# Patient Record
Sex: Female | Born: 1960 | Race: White | Hispanic: No | Marital: Married | Smoking: Never smoker
Health system: Southern US, Community
[De-identification: ages and names within clinical notes are randomized; demographics above are authoritative.]

## PROBLEM LIST (undated history)

## (undated) DIAGNOSIS — T7840XA Allergy, unspecified, initial encounter: Secondary | ICD-10-CM

## (undated) DIAGNOSIS — H409 Unspecified glaucoma: Secondary | ICD-10-CM

## (undated) DIAGNOSIS — E785 Hyperlipidemia, unspecified: Secondary | ICD-10-CM

## (undated) DIAGNOSIS — Z803 Family history of malignant neoplasm of breast: Secondary | ICD-10-CM

## (undated) DIAGNOSIS — R918 Other nonspecific abnormal finding of lung field: Secondary | ICD-10-CM

## (undated) DIAGNOSIS — D219 Benign neoplasm of connective and other soft tissue, unspecified: Secondary | ICD-10-CM

## (undated) DIAGNOSIS — Z8042 Family history of malignant neoplasm of prostate: Secondary | ICD-10-CM

## (undated) DIAGNOSIS — Z923 Personal history of irradiation: Secondary | ICD-10-CM

## (undated) DIAGNOSIS — I739 Peripheral vascular disease, unspecified: Secondary | ICD-10-CM

## (undated) DIAGNOSIS — M79604 Pain in right leg: Secondary | ICD-10-CM

## (undated) DIAGNOSIS — M79605 Pain in left leg: Secondary | ICD-10-CM

## (undated) DIAGNOSIS — Z973 Presence of spectacles and contact lenses: Secondary | ICD-10-CM

## (undated) DIAGNOSIS — N8110 Cystocele, unspecified: Secondary | ICD-10-CM

## (undated) DIAGNOSIS — F419 Anxiety disorder, unspecified: Secondary | ICD-10-CM

## (undated) DIAGNOSIS — I10 Essential (primary) hypertension: Secondary | ICD-10-CM

## (undated) DIAGNOSIS — I8393 Asymptomatic varicose veins of bilateral lower extremities: Secondary | ICD-10-CM

## (undated) DIAGNOSIS — Z9889 Other specified postprocedural states: Secondary | ICD-10-CM

## (undated) DIAGNOSIS — R112 Nausea with vomiting, unspecified: Secondary | ICD-10-CM

## (undated) DIAGNOSIS — C801 Malignant (primary) neoplasm, unspecified: Secondary | ICD-10-CM

## (undated) HISTORY — PX: TONSILLECTOMY: SUR1361

## (undated) HISTORY — DX: Family history of malignant neoplasm of breast: Z80.3

## (undated) HISTORY — DX: Pain in right leg: M79.604

## (undated) HISTORY — DX: Pain in right leg: M79.605

## (undated) HISTORY — DX: Benign neoplasm of connective and other soft tissue, unspecified: D21.9

## (undated) HISTORY — PX: VAGINAL PROLAPSE REPAIR: SHX830

## (undated) HISTORY — DX: Unspecified glaucoma: H40.9

## (undated) HISTORY — PX: OTHER SURGICAL HISTORY: SHX169

## (undated) HISTORY — DX: Asymptomatic varicose veins of bilateral lower extremities: I83.93

## (undated) HISTORY — PX: RHINOPLASTY: SUR1284

## (undated) HISTORY — DX: Family history of malignant neoplasm of prostate: Z80.42

## (undated) HISTORY — DX: Hyperlipidemia, unspecified: E78.5

## (undated) HISTORY — DX: Allergy, unspecified, initial encounter: T78.40XA

## (undated) HISTORY — DX: Anxiety disorder, unspecified: F41.9

---

## 1993-12-16 HISTORY — PX: VEIN LIGATION AND STRIPPING: SHX2653

## 1999-09-21 ENCOUNTER — Other Ambulatory Visit: Admission: RE | Admit: 1999-09-21 | Discharge: 1999-09-21 | Payer: Self-pay | Admitting: Obstetrics and Gynecology

## 2000-09-02 ENCOUNTER — Other Ambulatory Visit: Admission: RE | Admit: 2000-09-02 | Discharge: 2000-09-02 | Payer: Self-pay | Admitting: Obstetrics and Gynecology

## 2000-11-03 ENCOUNTER — Other Ambulatory Visit: Admission: RE | Admit: 2000-11-03 | Discharge: 2000-11-03 | Payer: Self-pay | Admitting: Obstetrics and Gynecology

## 2001-02-27 ENCOUNTER — Other Ambulatory Visit: Admission: RE | Admit: 2001-02-27 | Discharge: 2001-02-27 | Payer: Self-pay | Admitting: Obstetrics and Gynecology

## 2001-09-16 ENCOUNTER — Emergency Department (HOSPITAL_COMMUNITY): Admission: EM | Admit: 2001-09-16 | Discharge: 2001-09-16 | Payer: Self-pay | Admitting: Emergency Medicine

## 2001-11-18 ENCOUNTER — Encounter: Payer: Self-pay | Admitting: Unknown Physician Specialty

## 2001-11-18 ENCOUNTER — Ambulatory Visit (HOSPITAL_COMMUNITY): Admission: RE | Admit: 2001-11-18 | Discharge: 2001-11-18 | Payer: Self-pay | Admitting: Unknown Physician Specialty

## 2002-11-23 ENCOUNTER — Encounter: Payer: Self-pay | Admitting: Unknown Physician Specialty

## 2002-11-23 ENCOUNTER — Ambulatory Visit (HOSPITAL_COMMUNITY): Admission: RE | Admit: 2002-11-23 | Discharge: 2002-11-23 | Payer: Self-pay | Admitting: Unknown Physician Specialty

## 2003-10-17 ENCOUNTER — Encounter: Admission: RE | Admit: 2003-10-17 | Discharge: 2003-10-17 | Payer: Self-pay | Admitting: Family Medicine

## 2003-11-17 ENCOUNTER — Encounter: Admission: RE | Admit: 2003-11-17 | Discharge: 2003-11-17 | Payer: Self-pay | Admitting: Unknown Physician Specialty

## 2004-09-26 ENCOUNTER — Encounter: Admission: RE | Admit: 2004-09-26 | Discharge: 2004-09-26 | Payer: Self-pay | Admitting: Unknown Physician Specialty

## 2004-12-07 ENCOUNTER — Encounter: Admission: RE | Admit: 2004-12-07 | Discharge: 2004-12-07 | Payer: Self-pay | Admitting: Unknown Physician Specialty

## 2006-03-05 ENCOUNTER — Other Ambulatory Visit: Admission: RE | Admit: 2006-03-05 | Discharge: 2006-03-05 | Payer: Self-pay | Admitting: Obstetrics and Gynecology

## 2006-03-17 ENCOUNTER — Encounter: Admission: RE | Admit: 2006-03-17 | Discharge: 2006-03-17 | Payer: Self-pay | Admitting: Obstetrics and Gynecology

## 2006-05-27 ENCOUNTER — Other Ambulatory Visit: Admission: RE | Admit: 2006-05-27 | Discharge: 2006-05-27 | Payer: Self-pay | Admitting: Obstetrics and Gynecology

## 2007-03-19 ENCOUNTER — Encounter: Admission: RE | Admit: 2007-03-19 | Discharge: 2007-03-19 | Payer: Self-pay | Admitting: Obstetrics and Gynecology

## 2007-06-01 ENCOUNTER — Other Ambulatory Visit: Admission: RE | Admit: 2007-06-01 | Discharge: 2007-06-01 | Payer: Self-pay | Admitting: Obstetrics and Gynecology

## 2007-06-17 ENCOUNTER — Encounter: Admission: RE | Admit: 2007-06-17 | Discharge: 2007-06-17 | Payer: Self-pay | Admitting: Obstetrics and Gynecology

## 2008-04-21 ENCOUNTER — Encounter: Admission: RE | Admit: 2008-04-21 | Discharge: 2008-04-21 | Payer: Self-pay | Admitting: Obstetrics and Gynecology

## 2008-06-07 ENCOUNTER — Other Ambulatory Visit: Admission: RE | Admit: 2008-06-07 | Discharge: 2008-06-07 | Payer: Self-pay | Admitting: Obstetrics and Gynecology

## 2008-06-13 ENCOUNTER — Encounter: Admission: RE | Admit: 2008-06-13 | Discharge: 2008-06-13 | Payer: Self-pay | Admitting: Obstetrics and Gynecology

## 2008-12-16 HISTORY — PX: VAGINAL PROLAPSE REPAIR: SHX830

## 2008-12-16 HISTORY — PX: BLADDER SUSPENSION: SHX72

## 2009-05-30 ENCOUNTER — Inpatient Hospital Stay (HOSPITAL_COMMUNITY): Admission: RE | Admit: 2009-05-30 | Discharge: 2009-05-31 | Payer: Self-pay | Admitting: Obstetrics and Gynecology

## 2009-07-19 ENCOUNTER — Encounter: Admission: RE | Admit: 2009-07-19 | Discharge: 2009-07-19 | Payer: Self-pay | Admitting: Obstetrics and Gynecology

## 2010-11-22 ENCOUNTER — Encounter
Admission: RE | Admit: 2010-11-22 | Discharge: 2010-11-22 | Payer: Self-pay | Source: Home / Self Care | Admitting: Obstetrics and Gynecology

## 2011-01-05 ENCOUNTER — Encounter: Payer: Self-pay | Admitting: Unknown Physician Specialty

## 2011-03-25 LAB — CBC
HCT: 33.3 % — ABNORMAL LOW (ref 36.0–46.0)
HCT: 42.2 % (ref 36.0–46.0)
Hemoglobin: 11.8 g/dL — ABNORMAL LOW (ref 12.0–15.0)
Hemoglobin: 14.8 g/dL (ref 12.0–15.0)
MCHC: 35.5 g/dL (ref 30.0–36.0)
MCHC: 35.2 g/dL (ref 30.0–36.0)
MCV: 91.8 fL (ref 78.0–100.0)
MCV: 91.7 fL (ref 78.0–100.0)
Platelets: 205 10*3/uL (ref 150–400)
Platelets: 266 10*3/uL (ref 150–400)
RBC: 3.63 MIL/uL — ABNORMAL LOW (ref 3.87–5.11)
RBC: 4.6 MIL/uL (ref 3.87–5.11)
RDW: 12.5 % (ref 11.5–15.5)
RDW: 12.4 % (ref 11.5–15.5)
WBC: 12.3 10*3/uL — ABNORMAL HIGH (ref 4.0–10.5)
WBC: 8.3 10*3/uL (ref 4.0–10.5)

## 2011-03-25 LAB — BASIC METABOLIC PANEL
BUN: 8 mg/dL (ref 6–23)
BUN: 14 mg/dL (ref 6–23)
CO2: 26 mEq/L (ref 19–32)
CO2: 26 mEq/L (ref 19–32)
Calcium: 8.2 mg/dL — ABNORMAL LOW (ref 8.4–10.5)
Calcium: 9 mg/dL (ref 8.4–10.5)
Chloride: 108 mEq/L (ref 96–112)
Chloride: 106 mEq/L (ref 96–112)
Creatinine, Ser: 0.67 mg/dL (ref 0.4–1.2)
Creatinine, Ser: 0.7 mg/dL (ref 0.4–1.2)
GFR calc Af Amer: >60 mL/min (ref >60)
GFR calc Af Amer: >60 mL/min (ref >60)
GFR calc non Af Amer: >60 mL/min (ref >60)
GFR calc non Af Amer: >60 mL/min (ref >60)
Glucose, Bld: 96 mg/dL (ref 70–99)
Glucose, Bld: 90 mg/dL (ref 70–99)
Potassium: 3.8 mEq/L (ref 3.5–5.1)
Potassium: 3.9 mEq/L (ref 3.5–5.1)
Sodium: 137 mEq/L (ref 135–145)
Sodium: 137 mEq/L (ref 135–145)

## 2011-03-25 LAB — PREGNANCY, URINE: Preg Test, Ur: NEGATIVE

## 2011-03-25 LAB — URINALYSIS, ROUTINE W REFLEX MICROSCOPIC
Bilirubin Urine: NEGATIVE
Glucose, UA: NEGATIVE mg/dL
Hgb urine dipstick: NEGATIVE
Ketones, ur: NEGATIVE mg/dL
Nitrite: NEGATIVE
Protein, ur: NEGATIVE mg/dL
Specific Gravity, Urine: 1.015 (ref 1.005–1.030)
Urobilinogen, UA: 0.2 mg/dL (ref 0.0–1.0)
pH: 7.5 (ref 5.0–8.0)

## 2011-04-30 NOTE — Op Note (Signed)
Rhonda [name], Rhonda [name]                  ACCOUNT NO.:  [id number]   MEDICAL RECORD NO.:  [id number]          PATIENT TYPE:  INP   LOCATION:  9316                          FACILITY:  [facility]   PHYSICIAN:  Randye Lobo, M.D.   DATE OF BIRTH:  1961/10/28   DATE OF PROCEDURE:  05/30/2009  DATE OF DISCHARGE:                               OPERATIVE REPORT   PREOPERATIVE DIAGNOSES:  1. Genuine stress incontinence.  2. Incomplete vaginal prolapse.   POSTOPERATIVE DIAGNOSES:  1. Genuine stress incontinence.  2. Incomplete vaginal prolapse.   PROCEDURE:  Tension-free vaginal tape, mid urethral sling, and  cystoscopy, anterior and posterior colporrhaphy.   SURGEON:  Randye Lobo, MD   ASSISTANT:  Luvenia Redden, MD   ANESTHESIA:  General endotracheal.   IV FLUIDS:  1000 mL of Ringer's lactate.   ESTIMATED BLOOD LOSS:  250 mL.   URINE OUTPUT:  Quantity sufficient.   COMPLICATIONS:  None.   INDICATIONS FOR THE PROCEDURE:  The patient is a 50 year old, para 2,  Caucasian female, who presented with leakage of urine with sneezing,  coughing, and exercise.  The patient desired surgical treatment.  She  underwent multichannel urodynamic testing in the office on January 09, 2009, which documented a Valsalva leak point pressure of 132 cm of  water.  The patient had a stable cystometrogram.  Her uroflowmetry study  showed a normal voiding pattern with a postvoid residual of 7 mL.  The  patient on physical examination was noted to have a second-degree  cystocele and a second-degree rectocele, and she had good evidence of  uterine support.  The patient now desires treatment for the stress  incontinence and further cystocele and rectocele, and she declines a  hysterectomy.  A plan is therefore made to proceed with the mid urethral  sling, cystoscopy, and anterior and posterior colporrhaphy after risks,  benefits, and alternatives are reviewed.   FINDINGS:  Exam under anesthesia revealed a  second-degree cystocele and  a second-degree rectocele.  The uterus was quite high and well  supported.  There were no masses appreciated.   Cystoscopy demonstrated patent ureters bilaterally.  The bladder was  visualized throughout 360 degrees including the bladder dome and  trigone.  There was no evidence of a foreign body in either the bladder  or the urethra.   SPECIMENS:  None.   PROCEDURE:  The patient was reidentified in the preoperative hold area.  She did receive cefotetan 1 g IV for antibiotic prophylaxis and she  received TED hose and PAS stockings for DVT prophylaxis.   In the operating room, general endotracheal anesthesia was induced and  the patient was then placed in the dorsal lithotomy position.  The lower  abdomen, vagina, and perineum were sterilely prepped and draped.  A  Foley catheter was placed inside the bladder.   An exam under anesthesia was performed.   A weighted speculum was placed inside the vagina.  Allis clamps were  used to mark the midline of the vaginal mucosa from the level of 1 cm  below the urethral  meatus to approximately 2 cm above the cervix.  The  mucosa was injected locally with 0.5% lidocaine with 1:200,000 of  epinephrine.  The vaginal mucosa was incised vertically with a scalpel.  A Metzenbaum scissors was used to dissect the subvaginal tissue off the  overlying vaginal mucosa bilaterally.  Hemostasis was created with  monopolar cautery and also with figure-of-eight sutures of 2-0 Vicryl of  veins overlying the bladder.  The dissection was carried back to the  level of the pubic rami bilaterally.   The sling was performed next in the top-down fashion.  A 1 cm incisions  were created with 3 cm to the right and left of the midline and the  suprapubic region.  The abdominal needle passer was placed through the  right suprapubic incision and out through the endopelvic fascia at the  level of the mid urethra and on the ipsilateral side.   The same  procedure that was performed on the right-hand side was then repeated on  the left-hand side with the other abdominal needle passer.   The Foley was removed and cystoscopy was performed.  The abdominal  needle passer on the left-hand side showed no bladder perforation, but  was thought to be too close to the bladder wall.  It was therefore  removed and the bladder was drained of cystoscopy fluid and the Foley  catheter was replaced.  The abdominal needle passer on the left-hand  side was repositioned appropriately.  The Foley catheter was removed and  repeat cystoscopy was performed and the position of the abdominal needle  passer was good at this point.  The bladder was once again drained of  cystoscopy fluid and the Foley catheter was replaced.   The sling was attached to the abdominal needle passers and was drawn up  suprapubically.  The plastic sheaths were then removed while a Kelly  clamp was placed between the urethra and the sling.  Excess sling was  trimmed suprapubically.   The position of the sling was once again checked and was loosened  slightly vaginally.  It was noted to be in good position at this time.   The anterior colporrhaphy was performed with vertical mattress sutures  of 2-0 Vicryl for excellent reduction of the cystocele.  Excess vaginal  mucosa was trimmed and the anterior vaginal wall was then closed with a  running locked suture of 2-0 Vicryl.   The posterior colporrhaphy was performed next.  Allis clamps were used  to mark the perineal body and the vaginal mucosa from the introitus to a  level of 3 cm below the cervix.  The vaginal mucosa was injected locally  with 0.5% lidocaine with 1:200,000 of epinephrine.  A triangular wedge  of epithelium was excised from the perineal body.  The vaginal mucosa  was then incised vertically with a Metzenbaum scissors.  The  rectovaginal fascia was dissected off the overlying vaginal mucosa  bilaterally to  the top of the rectocele.  Hemostasis was created with  monopolar cautery.  The rectocele was reduced by placing vertical  mattress sutures of 2-0 Vicryl in the top portion of the vagina and then  0 Vicryl for the hymen and perineal body.  This provided excellent  reduction of the rectocele.  Excess vaginal mucosa was trimmed.  The  vaginal mucosa was then closed with a running locked suture of 2-0  Vicryl.  A crown stitch of 2-0 Vicryl was placed along the perineal  body.  The perineum  was closed in a subcuticular fashion as for an  episiotomy repair.   There was excellent caliber and depth to the vagina at this time.  The  vagina was packed with gauze with Estrace cream.   The suprapubic incisions were closed with subcuticular sutures of 4-0  Vicryl and then Dermabond over these.   Rectal exam did confirm the absence of sutures in the rectum.   This concluded the patient's procedure.  There were no complications.  All needle, instrument, and sponge counts were correct.      Randye Lobo, M.D.  Electronically Signed     BES/MEDQ  D:  05/30/2009  T:  05/30/2009  Job:  [id number]

## 2011-11-15 ENCOUNTER — Other Ambulatory Visit: Payer: Self-pay | Admitting: Obstetrics and Gynecology

## 2011-11-15 DIAGNOSIS — Z1231 Encounter for screening mammogram for malignant neoplasm of breast: Secondary | ICD-10-CM

## 2011-12-16 ENCOUNTER — Ambulatory Visit
Admission: RE | Admit: 2011-12-16 | Discharge: 2011-12-16 | Disposition: A | Discharge status: Home / Self Care | Payer: BC Managed Care – PPO | Source: Ambulatory Visit | Attending: Obstetrics and Gynecology | Admitting: Obstetrics and Gynecology

## 2011-12-16 DIAGNOSIS — Z1231 Encounter for screening mammogram for malignant neoplasm of breast: Secondary | ICD-10-CM

## 2012-01-13 ENCOUNTER — Ambulatory Visit: Payer: BC Managed Care – PPO

## 2012-01-13 DIAGNOSIS — E789 Disorder of lipoprotein metabolism, unspecified: Secondary | ICD-10-CM

## 2012-01-13 DIAGNOSIS — R635 Abnormal weight gain: Secondary | ICD-10-CM

## 2012-01-13 DIAGNOSIS — F430 Acute stress reaction: Secondary | ICD-10-CM

## 2012-01-15 ENCOUNTER — Telehealth: Payer: Self-pay

## 2012-01-15 NOTE — Telephone Encounter (Signed)
Pt notified of chlolesterol results and also if we had sent note to [place] benefits.  We had wrong number for fax number. Note refaxed to right number

## 2012-01-15 NOTE — Telephone Encounter (Signed)
Patient would like for updated scripts to be faxed to her husbands insurance [employer] states they never received info that was supposed to have been faxed last week. Patient would also like some one to call her about her cholesterol results. Please fax updated scripts to [facility] Benefits @ [phone]

## 2012-01-16 ENCOUNTER — Telehealth: Payer: Self-pay

## 2012-01-16 NOTE — Telephone Encounter (Signed)
Pt would like for Launa Flight to call her back she had spoken to her yesterday but forgot to ask her about the thyroid results

## 2012-01-16 NOTE — Telephone Encounter (Signed)
Chart in box in lounge 

## 2012-01-17 ENCOUNTER — Telehealth: Payer: Self-pay

## 2012-01-17 NOTE — Telephone Encounter (Signed)
Called pt and discussed her thyroid and cholesterol results.

## 2012-01-17 NOTE — Telephone Encounter (Signed)
SPOKE WITH PT AND GAVE LAB RESULTS. PT HAS A QUESTION FOR DR DAUB: CAN SPIROLACTONE CAUSE YOUR CHOLESTEROL TO RISE UP BECAUSE SHE READ ONLINE THAT THIS MAY HAPPEN. DO YOU KNOW THIS TO BE TRUE? PLEASE ADVISE

## 2012-01-18 NOTE — Telephone Encounter (Signed)
Left message to call back  

## 2012-01-19 NOTE — Telephone Encounter (Signed)
Please review msg and advise.  Chart in Daubs box.

## 2012-05-15 ENCOUNTER — Telehealth: Payer: Self-pay

## 2012-05-15 NOTE — Telephone Encounter (Signed)
Pt would like to know about her lab work done in January she hasnt heard anything, she would like a referral to an endocronolgist. Please contact she has had several deaths in family and is needing advice on how to lose weight.

## 2012-05-17 NOTE — Telephone Encounter (Signed)
Left message for patient to call back  

## 2012-05-17 NOTE — Telephone Encounter (Signed)
LDL is barely up. No changes. Why is she wanting a referral to endocrinologist? She should eat a healthy well balanced diet and exercise regularly to lose weight.

## 2012-05-17 NOTE — Telephone Encounter (Signed)
Chart is at nurses station for review.  She was given labs over the phone in Jan.  Please advise on the other things

## 2012-05-18 NOTE — Telephone Encounter (Signed)
Pt's chart is in Dr Ellis Parents box

## 2012-05-18 NOTE — Telephone Encounter (Signed)
Pt CB and wanted results of TSH/T4 from Jan. Gave pt normal results. Pt is having a terrible time w/hormonal changes w/menopause and trouble w/weight gain/not being able to lose weight even w/very good diet. Pt asked her GYN about checking for hormonal levels being off and she did not run any tests and stated that the bloodwork is not always accurate. Pt is would like a referral to see Dr Talmage Nap to explore imbalances on hormones, if Dr Cleta Alberts thinks it is appropriate. If not, does he think she should see another GYN? Her regular GYN has left the country and she didn't like the one she saw last that well. So, if so, she would like a referral to another GYN, but she would like to be checked by endo first if possible.

## 2012-05-19 ENCOUNTER — Other Ambulatory Visit: Payer: Self-pay | Admitting: Emergency Medicine

## 2012-05-19 DIAGNOSIS — R232 Flushing: Secondary | ICD-10-CM

## 2012-05-19 NOTE — Telephone Encounter (Signed)
Let pt know that referral has been started. Pt agreed.

## 2012-05-19 NOTE — Telephone Encounter (Signed)
LMOM TO CB 

## 2012-05-19 NOTE — Telephone Encounter (Signed)
Please call Rhonda [name] and let her know I certainly feel an evaluation by Dr. Lisabeth Devoid would be very appropriate. I put in orders only referral to Dr. Talmage Nap. Please be sure we send all of her blood work to the endocrinologist

## 2012-09-07 ENCOUNTER — Other Ambulatory Visit: Payer: Self-pay | Admitting: Obstetrics and Gynecology

## 2012-09-07 DIAGNOSIS — N6009 Solitary cyst of unspecified breast: Secondary | ICD-10-CM

## 2012-09-08 ENCOUNTER — Ambulatory Visit
Admission: RE | Admit: 2012-09-08 | Discharge: 2012-09-08 | Disposition: A | Discharge status: Home / Self Care | Payer: BC Managed Care – PPO | Source: Ambulatory Visit | Attending: Obstetrics and Gynecology | Admitting: Obstetrics and Gynecology

## 2012-09-08 ENCOUNTER — Other Ambulatory Visit: Payer: Self-pay | Admitting: Obstetrics and Gynecology

## 2012-09-08 DIAGNOSIS — N6009 Solitary cyst of unspecified breast: Secondary | ICD-10-CM

## 2012-09-08 HISTORY — PX: BREAST CYST ASPIRATION: SHX578

## 2012-12-02 ENCOUNTER — Other Ambulatory Visit: Payer: Self-pay | Admitting: Obstetrics and Gynecology

## 2012-12-02 DIAGNOSIS — Z1231 Encounter for screening mammogram for malignant neoplasm of breast: Secondary | ICD-10-CM

## 2013-01-05 ENCOUNTER — Ambulatory Visit
Admission: RE | Admit: 2013-01-05 | Discharge: 2013-01-05 | Disposition: A | Discharge status: Home / Self Care | Payer: BC Managed Care – PPO | Source: Ambulatory Visit | Attending: Obstetrics and Gynecology | Admitting: Obstetrics and Gynecology

## 2013-01-05 DIAGNOSIS — Z1231 Encounter for screening mammogram for malignant neoplasm of breast: Secondary | ICD-10-CM

## 2013-04-05 ENCOUNTER — Ambulatory Visit: Payer: BC Managed Care – PPO | Admitting: Emergency Medicine

## 2013-04-05 ENCOUNTER — Encounter: Payer: Self-pay | Admitting: Emergency Medicine

## 2013-04-05 VITALS — BP 126/84 | HR 80 | Temp 98.0°F | Resp 16 | Ht 66.0 in | Wt 199.6 lb

## 2013-04-05 DIAGNOSIS — E785 Hyperlipidemia, unspecified: Secondary | ICD-10-CM

## 2013-04-05 HISTORY — DX: Hyperlipidemia, unspecified: E78.5

## 2013-04-05 LAB — COMPREHENSIVE METABOLIC PANEL
ALT: 20 U/L (ref 0–35)
Albumin: 4.4 g/dL (ref 3.5–5.2)
CO2: 27 mEq/L (ref 19–32)
Calcium: 9.7 mg/dL (ref 8.4–10.5)
Chloride: 106 mEq/L (ref 96–112)
Glucose, Bld: 92 mg/dL (ref 70–99)
Potassium: 4.3 mEq/L (ref 3.5–5.3)
Sodium: 140 mEq/L (ref 135–145)
Total Protein: 6.9 g/dL (ref 6.0–8.3)

## 2013-04-05 LAB — LIPID PANEL
Cholesterol: 243 mg/dL — ABNORMAL HIGH (ref 0–200)
LDL Cholesterol: 162 mg/dL — ABNORMAL HIGH (ref 0–99)
Total CHOL/HDL Ratio: 5.1 Ratio
Triglycerides: 166 mg/dL — ABNORMAL HIGH (ref <150)
VLDL: 33 mg/dL (ref 0–40)

## 2013-04-05 NOTE — Progress Notes (Signed)
  Subjective:    Patient ID: Rhonda [name], female    DOB: Dec 20, 1960, 52 y.o.   MRN: [id number]  HPI    Review of Systems  Constitutional: Negative.   HENT: Negative.   Eyes: Negative.   Respiratory: Negative.   Cardiovascular: Negative.   Gastrointestinal: Negative.   Genitourinary: Negative.   Musculoskeletal: Negative.   Skin: Negative.   Allergic/Immunologic: Negative.   Neurological: Negative.   Hematological: Negative.   Psychiatric/Behavioral: Negative.        Objective:   Physical Exam Neck is supple chest is clear heart regular and no murmur       Assessment & Plan:  We'll check followup cholesterol further management after these return. She's been having thorough evaluation of her thyroid with Dr. Talmage Nap.

## 2013-04-06 ENCOUNTER — Other Ambulatory Visit: Payer: Self-pay | Admitting: Emergency Medicine

## 2013-04-06 ENCOUNTER — Telehealth: Payer: Self-pay

## 2013-04-06 DIAGNOSIS — E785 Hyperlipidemia, unspecified: Secondary | ICD-10-CM

## 2013-04-06 MED ORDER — CLONAZEPAM 0.5 MG PO TABS: 0.5000 mg | ORAL_TABLET | Freq: Two times a day (BID) | ORAL | Status: DC | PRN

## 2013-04-06 NOTE — Telephone Encounter (Signed)
faxed

## 2013-04-06 NOTE — Telephone Encounter (Signed)
PT STATES SHE FORGOT TO LET DR DAUB KNOW SHE NEED A NEW PRESCRIPTION FOR KLONOPIN BECAUSE THE ONES SHE HAVE IS REALLY OLD AND HAVE EXPIRED, PLEASE CALL [facility] BY MAIL AT [phone] AND YOU MAY REACH PT AT [phone] IF NEEDED AND SHE TAKE THE .5MG S

## 2013-04-06 NOTE — Telephone Encounter (Signed)
Will come get, have another one for signature

## 2013-04-06 NOTE — Telephone Encounter (Signed)
I will send you the prescription to be sent in for the patient

## 2013-04-07 ENCOUNTER — Telehealth: Payer: Self-pay

## 2013-04-07 NOTE — Telephone Encounter (Signed)
There is no emergency about seeing the cardiologist. If she would like to see Dr. Jacinto Halim. He would be fine. Any of the cardiologist in town would be fine to see her and discuss management of lipids. It is fine for her to stay off of all prescription drugs until she can thoroughly researched this and see the cardiologist

## 2013-04-07 NOTE — Telephone Encounter (Signed)
Pt is calling because she has a question about an apt that was made for her Call back number is [phone]

## 2013-04-07 NOTE — Telephone Encounter (Signed)
Spoke with patient and let her know that the appointment on May 20 with Dr. Eden Emms is fine.  There is no emergency to see him .  She does not want to see Dr. Jacinto Halim..  She will do her investigation of Dr. Eden Emms and the statin drugs before the appointment.

## 2013-04-07 NOTE — Telephone Encounter (Signed)
Spoke with patient and she rescheduled her appt with Dr. Eden Emms from tomorrow to May 20.  She just wanted to know if this ok to put the appointment off until this date.  She is concerned about not knowing Dr. Eden Emms and would like to research him,  Also wanted to make sure that you knew about a severe allergic reaction she had to diflucan a few years ago and now afraid to take any new drug.  She mentioned that you had referred her to Dr, Jacinto Halim in the past.  Please advise if this is ok

## 2013-04-08 ENCOUNTER — Ambulatory Visit: Payer: BC Managed Care – PPO | Admitting: Cardiovascular Disease

## 2013-04-08 ENCOUNTER — Telehealth: Payer: Self-pay | Admitting: Cardiovascular Disease

## 2013-04-08 NOTE — Telephone Encounter (Signed)
New problem    New patient has questions before her appt to make sure he does certain test that she's interested in

## 2013-04-09 ENCOUNTER — Telehealth: Payer: Self-pay

## 2013-04-09 NOTE — Telephone Encounter (Signed)
We certainly can ahead and put in an order to only for light the sinuses lipid evaluation. If you would go ahead and do this that would be great and it would be nice for her to have that when she sees the cardiologist

## 2013-04-09 NOTE — Telephone Encounter (Signed)
Pt states she Liposcience labs done, she wants to know if you can do this, or you want Dr Daleen Squibb to do this?

## 2013-04-09 NOTE — Telephone Encounter (Signed)
Patient would like a nurse to call her regarding some specific tests she wants done please call her at [phone]

## 2013-04-09 NOTE — Telephone Encounter (Signed)
Spoke with pt, she asked if we were able to draw a lipomed profile. Made aware we can draw the bllod for the testing but it is sent out for processing and takes about two weeks to result. Pt voiced understanding.

## 2013-04-09 NOTE — Telephone Encounter (Signed)
Can you help with this? Patient needs the liposcience labs done, unsure how to order. See me Amy

## 2013-04-12 NOTE — Telephone Encounter (Signed)
Discussed with ReNee' lab supervisor and she advised patient will come in for this, requisition is manual, no need to order in computer.

## 2013-05-04 ENCOUNTER — Ambulatory Visit: Payer: BC Managed Care – PPO | Admitting: Cardiovascular Disease

## 2013-05-14 ENCOUNTER — Encounter: Payer: Self-pay | Admitting: Cardiovascular Disease

## 2013-05-14 ENCOUNTER — Encounter: Payer: Self-pay | Admitting: *Deleted

## 2013-05-14 DIAGNOSIS — F419 Anxiety disorder, unspecified: Secondary | ICD-10-CM | POA: Insufficient documentation

## 2013-05-17 ENCOUNTER — Ambulatory Visit (INDEPENDENT_AMBULATORY_CARE_PROVIDER_SITE_OTHER): Payer: BC Managed Care – PPO | Admitting: Cardiovascular Disease

## 2013-05-17 ENCOUNTER — Encounter: Payer: Self-pay | Admitting: Cardiovascular Disease

## 2013-05-17 VITALS — BP 130/79 | HR 88 | Ht 67.0 in | Wt 196.0 lb

## 2013-05-17 DIAGNOSIS — E785 Hyperlipidemia, unspecified: Secondary | ICD-10-CM

## 2013-05-17 DIAGNOSIS — Z0000 Encounter for general adult medical examination without abnormal findings: Secondary | ICD-10-CM

## 2013-05-17 NOTE — Progress Notes (Signed)
Patient ID: Rhonda [name], female   DOB: 05-22-1961, 52 y.o.   MRN: [id number] 52 yo with anxiety.  Has had brother die of ETOH/DCM father die and a lot of stress recently. She is overweight.  She has been advised to take statin but does not want to "Scared to death"  Had anaphylactic reaction to diflucan.    Labs: 04/05/13 TC 243 LDL: 162 TG: 166 HDL: 48  Taking red yeast rice and watching diet.  Sedentary.  ACC and Framingham risk scores for CAD event in next 10 years is only 2%  Discussed genetic aspects of her lipid issue.  Also discussed the fact that lipomed profile is more uselful in patients with normal LDL who may have vascular risk due to high particle sizes.  Based on new ACC guidelines she would not necessarily require statin Rx.  This coupled with her fear would suggest she should not be on them.  Discussed vascular screening and calcium score since the reason for primary prevention is concern for vascular/coronary event.     ROS: Denies fever, malais, weight loss, blurry vision, decreased visual acuity, cough, sputum, SOB, hemoptysis, pleuritic pain, palpitaitons, heartburn, abdominal pain, melena, lower extremity edema, claudication, or rash.  All other systems reviewed and negative   General: Affect appropriate Overweight white female HEENT: normal Neck supple with no adenopathy JVP normal no bruits no thyromegaly Lungs clear with no wheezing and good diaphragmatic motion Heart:  S1/S2 no murmur,rub, gallop or click PMI normal Abdomen: benighn, BS positve, no tenderness, no AAA no bruit.  No HSM or HJR Distal pulses intact with no bruits No edema Neuro non-focal Skin warm and dry No muscular weakness  Medications Current Outpatient Prescriptions  Medication Sig Dispense Refill  . Ascorbic Acid (VITAMIN C) 1000 MG tablet Take 1,000 mg by mouth daily.      Marland Kitchen b complex vitamins tablet Take 1 tablet by mouth daily.      . clonazePAM (KLONOPIN) 0.5 MG tablet Take 1 tablet  (0.5 mg total) by mouth 2 (two) times daily as needed for anxiety.  30 tablet  3  . Coenzyme Q10 (CO Q-10) 100 MG CAPS Take by mouth.      . hydrocortisone 2.5 % cream DAILY      . multivitamin-lutein (OCUVITE-LUTEIN) CAPS Take 1 capsule by mouth daily.      Marland Kitchen OVER THE COUNTER MEDICATION Vitamin D 3 2000 iu taking in the a.m.      . OVER THE COUNTER MEDICATION Flaxseed oil 1000 mg taking 3 times a day      . OVER THE COUNTER MEDICATION Epax fish oil Omega 3 taking 3 times a day      . OVER THE COUNTER MEDICATION Tocotrienol taking one a day      . Red Yeast Rice 600 MG CAPS Take 600 mg by mouth 2 (two) times daily.      . vitamin E (VITAMIN E) 1000 UNIT capsule Take 1,000 Units by mouth daily.       No current facility-administered medications for this visit.    Allergies Diflucan  Family History: Family History  Problem Relation Age of Onset  . Cancer Maternal Grandmother     breast cancer  . Heart disease Maternal Grandfather   . Stroke Brother     Social History: History   Social History  . Marital Status: Married    Spouse Name: N/A    Number of Children: N/A  . Years of Education: N/A  Occupational History  . Registered Dental Hygienist    Social History Main Topics  . Smoking status: Never Smoker   . Smokeless tobacco: Not on file  . Alcohol Use: Yes     Comment: 1 glass of wine at dinner  . Drug Use: No  . Sexually Active: Yes -- Female partner(s)    Birth Control/ Protection: Condom   Other Topics Concern  . Not on file   Social History Narrative   Married. Education: college. Exercise: walk for 1 hour everyday.    Electrocardiogram: SR rate 87  normal  Assessment and Plan

## 2013-05-17 NOTE — Patient Instructions (Signed)
Your physician recommends that you schedule a follow-up appointment in: AS NEEDED  Your physician recommends that you continue on your current medications as directed. Please refer to the Current Medication list given to you today.  

## 2013-05-17 NOTE — Assessment & Plan Note (Signed)
Does not have to be on statin.  F/U vascular screening ( AAA, carotid and ABI) as well as calcium score.  If any real measure of vascular disease seen on these tests can reconsider.  Continue diet Rx.  Continue red yeast

## 2013-05-27 ENCOUNTER — Ambulatory Visit (INDEPENDENT_AMBULATORY_CARE_PROVIDER_SITE_OTHER)
Admission: RE | Admit: 2013-05-27 | Discharge: 2013-05-27 | Disposition: A | Discharge status: Home / Self Care | Payer: BC Managed Care – PPO | Source: Ambulatory Visit | Attending: Cardiovascular Disease | Admitting: Cardiovascular Disease

## 2013-05-27 ENCOUNTER — Ambulatory Visit (INDEPENDENT_AMBULATORY_CARE_PROVIDER_SITE_OTHER): Payer: BC Managed Care – PPO

## 2013-05-27 DIAGNOSIS — Z0000 Encounter for general adult medical examination without abnormal findings: Secondary | ICD-10-CM

## 2013-05-27 DIAGNOSIS — E785 Hyperlipidemia, unspecified: Secondary | ICD-10-CM

## 2013-06-02 ENCOUNTER — Telehealth: Payer: Self-pay | Admitting: Cardiovascular Disease

## 2013-06-02 NOTE — Telephone Encounter (Signed)
Spoke with patient about Calcium Artery Score and carotid and aorta screenings.

## 2013-06-02 NOTE — Telephone Encounter (Signed)
Follow Up ° ° ° ° °Pt calling in wanting to follow up on test results. Please call. °

## 2013-06-16 ENCOUNTER — Telehealth: Payer: Self-pay

## 2013-06-16 ENCOUNTER — Telehealth: Payer: Self-pay | Admitting: Radiology

## 2013-06-16 NOTE — Telephone Encounter (Signed)
Yes. Called her to advise.

## 2013-06-16 NOTE — Telephone Encounter (Signed)
Has an appt in Sept. And wants to know if she fast that day.  Thanks!

## 2013-06-16 NOTE — Telephone Encounter (Signed)
She does not want to fast told her this is fine

## 2013-07-13 ENCOUNTER — Ambulatory Visit: Payer: BC Managed Care – PPO | Admitting: Emergency Medicine

## 2013-07-15 ENCOUNTER — Encounter: Payer: Self-pay | Admitting: Obstetrics and Gynecology

## 2013-07-15 ENCOUNTER — Telehealth: Payer: Self-pay | Admitting: Obstetrics and Gynecology

## 2013-07-15 ENCOUNTER — Ambulatory Visit (INDEPENDENT_AMBULATORY_CARE_PROVIDER_SITE_OTHER): Payer: BC Managed Care – PPO | Admitting: Obstetrics and Gynecology

## 2013-07-15 VITALS — BP 130/76 | HR 80 | Ht 65.5 in | Wt 195.5 lb

## 2013-07-15 DIAGNOSIS — N921 Excessive and frequent menstruation with irregular cycle: Secondary | ICD-10-CM

## 2013-07-15 DIAGNOSIS — N920 Excessive and frequent menstruation with regular cycle: Secondary | ICD-10-CM

## 2013-07-15 LAB — HEMOGLOBIN, FINGERSTICK: Hemoglobin, fingerstick: 14.9 g/dL (ref 12.0–16.0)

## 2013-07-15 MED ORDER — MEDROXYPROGESTERONE ACETATE 10 MG PO TABS: 10.0000 mg | ORAL_TABLET | Freq: Every day | ORAL | Status: DC

## 2013-07-15 NOTE — Telephone Encounter (Signed)
After speaking with Dr. Farrel Gobble, I called and scheduled this patient for today, 07/15/13, at 1:00PM.

## 2013-07-15 NOTE — Telephone Encounter (Signed)
Provera-patient needs clarification on directions please?

## 2013-07-15 NOTE — Addendum Note (Signed)
Addended by: Alphonsa Overall on: 07/15/2013 03:15 PM   Modules accepted: Orders

## 2013-07-15 NOTE — Patient Instructions (Signed)
Menorrhagia Dysfunctional uterine bleeding is different from a normal menstrual period. When periods are heavy or there is more bleeding than is usual for you, it is called menorrhagia. It may be caused by hormonal imbalance, or physical, metabolic, or other problems. Examination is necessary in order that your caregiver may treat treatable causes. If this is a continuing problem, a D&C may be needed. That means that the cervix (the opening of the uterus or womb) is dilated (stretched larger) and the lining of the uterus is scraped out. The tissue scraped out is then examined under a microscope by a specialist (pathologist) to make sure there is nothing of concern that needs further or more extensive treatment. HOME CARE INSTRUCTIONS   If medications were prescribed, take exactly as directed. Do not change or switch medications without consulting your caregiver.  Long term heavy bleeding may result in iron deficiency. Your caregiver may have prescribed iron pills. They help replace the iron your body lost from heavy bleeding. Take exactly as directed. Iron may cause constipation. If this becomes a problem, increase the bran, fruits, and roughage in your diet.  Do not take aspirin or medicines that contain aspirin one week before or during your menstrual period. Aspirin may make the bleeding worse.  If you need to change your sanitary pad or tampon more than once every 2 hours, stay in bed and rest as much as possible until the bleeding stops.  Eat well-balanced meals. Eat foods high in iron. Examples are leafy green vegetables, meat, liver, eggs, and whole grain breads and cereals. Do not try to lose weight until the abnormal bleeding has stopped and your blood iron level is back to normal. SEEK MEDICAL CARE IF:   You need to change your sanitary pad or tampon more than once an hour.  You develop nausea (feeling sick to your stomach) and vomiting, dizziness, or diarrhea while you are taking your  medicine.  You have any problems that may be related to the medicine you are taking. SEEK IMMEDIATE MEDICAL CARE IF:   You have a fever.  You develop chills.  You develop severe bleeding or start to pass blood clots.  You feel dizzy or faint. MAKE SURE YOU:   Understand these instructions.  Will watch your condition.  Will get help right away if you are not doing well or get worse. Document Released: 12/02/2005 Document Revised: 02/24/2012 Document Reviewed: 07/22/2008 [facility] Patient Information 2014 [place], [place].  Metrorrhagia  Metrorrhagia is uterine bleeding at irregular intervals, especially between menstrual periods.  CAUSES   Dysfunctional uterine bleeding.  Uterine lining growing outside the uterus (endometriosis).  Embryo adhering to uterine wall (implantation).  Pregnancy growing in the fallopian tubes (ectopic pregnancy).  Miscarriage.  Menopause.  Cancer of the reproduction organs.  Certain drugs such as hormonal contraceptives.  Inherited bleeding disorders.  Trauma.  Uterine fibroids.  Sexually transmitted diseases (STDs).  Polycystic ovarian disease. DIAGNOSIS  A history will be taken.  A physical exam will be performed.  Other tests may include:  Blood tests.  A pregnancy test.  An ultrasound of the abdomen and pelvis.  A biopsy of the uterine lining.  AMRI or CT scan of the abdomen and pelvis. TREATMENT Treatment will depend on the cause. HOME CARE INSTRUCTIONS   Take all medicines as directed by your caregiver. Do not change or switch medicines without talking to your caregiver.  Take all iron supplements exactly as directed by your caregiver. Iron supplements help to replace the  iron your body loses from irregular bleeding.If you become constipated, increase the amount of fiber, fruits, and vegetables in your diet.  Do not take aspirin or medicines that contain aspirin for 1 week before your menstrual period or  during your menstrual period. Aspirin may increase the bleeding.  Rest as much as possible if you change your sanitary pad or tampon more than once every 2 hours.  Eat well-balanced meals including foods high in iron, such as green leafy vegetables, red meat, liver, eggs, and whole-grain breads and cereals.  Do not try to lose weight until the abnormal bleeding is controlled and your blood iron level is back to normal. SEEK MEDICAL CARE IF:   You have nausea and vomiting, or you cannot keep foods down.  You feel dizzy or have diarrhea while taking medicine.  You have any problems that may be related to the medicine you are taking. SEEK IMMEDIATE MEDICAL CARE IF:   You have a fever.  You develop chills.  You become lightheaded or faint.  You need to change your sanitary pad or tampon more than once an hour.  Your bleeding becomesheavy.  You begin to pass clots or tissue. MAKE SURE YOU:   Understand these instructions.  Will watch your condition.  Will get help right away if you are not doing well or get worse. Document Released: 12/02/2005 Document Revised: 02/24/2012 Document Reviewed: 07/01/2011 [facility] Patient Information 2014 [place], [place].

## 2013-07-15 NOTE — Progress Notes (Signed)
Patient ID: Rhonda [name], female   DOB: [birth year], 52 y.o.   MRN: [id number]  52 y.o.   Married    Caucasian   female   [phone]   here for bleeding irregularities. Goes for 2 - 3 months without a cycles. LMP July 13th, which was normal.  Then began bleeding again on July 27th. Heavy flow with gushing and clots.   Seems to be subsiding now.   Noting acne as well this month. Sweating and night sweats.  Does have a history of heavy cycles off and on but nothing like what happened the last few days.    History of some tiny uterine fibroids.    Took OCPs in her twenties.  Had nausea, so stopped.    History of varicose veins and vein stripping.    Saw Dr. Romero Belling for thyroid work up in Nov. 2013. - normal.  Saw Dr. Romero Belling due to weight fluctuation.    Doing well since bladder surgery. Had a sling in 2011. No leakage with coughing and sneezing.    Patient's father and brother died since I last provided medical care for her.  Brother cardiomyopathy - alcoholic.  Patient's last menstrual period was 07/12/2013.          Sexually active: yes  The current method of family planning is condoms most of the time.      Last mammogram:  Breast cyst aspiration September 2013. Last pap smear:  Jan, 2014 - WNL    Family History  Problem Relation Age of Onset  . Cancer Maternal Grandmother     breast cancer  . Heart disease Maternal Grandfather   . Stroke Brother     Patient Active Problem List   Diagnosis Date Noted  . Anxiety   . Other and unspecified hyperlipidemia 04/05/2013    Past Medical History  Diagnosis Date  . Anxiety   . Other and unspecified hyperlipidemia 04/05/2013  . Fibroid     Past Surgical History  Procedure Laterality Date  . Rhinoplasty    . Varicose veins    . Tonsillectomy    . Bladder suspension  2010    Dr.Silva    Allergies: Diflucan  Current Outpatient Prescriptions  Medication Sig Dispense Refill  . Ascorbic Acid (VITAMIN C) 1000 MG tablet Take  1,000 mg by mouth daily.      . hydrocortisone 2.5 % cream DAILY      . multivitamin-lutein (OCUVITE-LUTEIN) CAPS Take 1 capsule by mouth daily.      Marland Kitchen OVER THE COUNTER MEDICATION Vitamin D 3 2000 iu taking in the a.m.      . OVER THE COUNTER MEDICATION Flaxseed oil 1000 mg taking 3 times a day      . OVER THE COUNTER MEDICATION Epax fish oil Omega 3 taking 3 times a day      . vitamin E (VITAMIN E) 1000 UNIT capsule Take 1,000 Units by mouth daily.      Marland Kitchen b complex vitamins tablet Take 1 tablet by mouth daily.      . clonazePAM (KLONOPIN) 0.5 MG tablet Take 1 tablet (0.5 mg total) by mouth 2 (two) times daily as needed for anxiety.  30 tablet  3  . Coenzyme Q10 (CO Q-10) 100 MG CAPS Take by mouth.      Marland Kitchen OVER THE COUNTER MEDICATION Tocotrienol taking one a day      . Red Yeast Rice 600 MG CAPS Take 600 mg by mouth 2 (two) times daily.  No current facility-administered medications for this visit.    ROS: Pertinent items are noted in HPI.  Social Hx:  Married.  Exam:    BP 130/76  Pulse 80  Ht 5' 5.5" (1.664 m)  Wt 195 lb 8 oz (88.678 kg)  BMI 32.03 kg/m2  LMP 07/12/2013   Wt Readings from Last 3 Encounters:  07/15/13 195 lb 8 oz (88.678 kg)  05/17/13 196 lb (88.905 kg)  04/05/13 199 lb 9.6 oz (90.538 kg)     Ht Readings from Last 3 Encounters:  07/15/13 5' 5.5" (1.664 m)  05/17/13 5\' 7"  (1.702 m)  04/05/13 5\' 6"  (1.676 m)    General appearance: alert, cooperative and appears stated age Head: Normocephalic, without obvious abnormality, atraumatic Neck: no adenopathy, supple, symmetrical, trachea midline and thyroid not enlarged, symmetric, no tenderness/mass/nodules Lungs: clear to auscultation bilaterally Heart: regular rate and rhythm Abdomen: soft, non-tender; bowel sounds normal; no masses,  no organomegaly Extremities: extremities normal, atraumatic, no cyanosis or edema Skin: Skin color, texture, turgor normal. No rashes or lesions Lymph nodes: Cervical and  supraclavicular nodes. No abnormal inguinal nodes palpated Neurologic: Grossly normal   Pelvic: External genitalia:  no lesions              Urethra:  normal appearing urethra with no masses, tenderness or lesions.  No sling is exposed.  Good anterior vaginal wall support.              Bartholins and Skenes: normal                 Vagina: normal appearing vagina with normal color and discharge, no lesions.  Dark blood filling vaginal vault.  No active bleeding from the cervical os.               Cervix: normal appearance              Pap taken: no        Bimanual Exam:  Uterus:  uterus is 6 week size and firm and slightly irregular in shape, nontender                                      Adnexa: normal adnexa in size, nontender and no masses                   Patient given sprite and crackers after exam - felt dizzy.  Has not eaten today.                 Hgb 14.9         Assessment  Menometrorrhagia. Peimenopausal female. History of uterine fibroids.  Plan  UPT now - neg Provera 10 mg daily for 2 weeks. Return for pelvic ultrasound, possible saline ultrasound and possible endometrial biopsy.   An After Visit Summary was printed and given to the patient.

## 2013-07-15 NOTE — Telephone Encounter (Signed)
Patient is having some irregular bleeding and cramping please call.

## 2013-07-16 ENCOUNTER — Telehealth: Payer: Self-pay | Admitting: *Deleted

## 2013-07-16 NOTE — Telephone Encounter (Signed)
Patient given instructions for Provera medication to take one daily x 2 weeks per Dr. Edward Jolly. Patient will call if any more questions.

## 2013-07-16 NOTE — Telephone Encounter (Signed)
Left message on CB#VM of need to return call to our office for medication questions.

## 2013-07-21 NOTE — Telephone Encounter (Signed)
Patient is concerned about taking provera and upcoming ultra sound appointment. Please call

## 2013-07-22 ENCOUNTER — Other Ambulatory Visit: Payer: Self-pay | Admitting: Obstetrics and Gynecology

## 2013-07-22 MED ORDER — MEDROXYPROGESTERONE ACETATE 10 MG PO TABS: 10.0000 mg | ORAL_TABLET | Freq: Every day | ORAL | Status: DC

## 2013-07-22 NOTE — Telephone Encounter (Signed)
Yes. Rhonda [name] scheduled her for 08-05-13, first appt available at the time she precerted her. Do you want to refill provera to get her through until then?

## 2013-07-22 NOTE — Telephone Encounter (Signed)
Returning call.

## 2013-07-22 NOTE — Telephone Encounter (Signed)
Spoke with pt about timing of provera with upcoming PUS/SHGM on 08-05-13. Pt has been taking provera since 7-31, bleeding subsided 2 days later and she is not currently bleeding. Pt is concerned she may start bleeding again after she stops provera in a week, and is worried bleeding will interfere with PUS/SHGM. Pt's cycles have been unpredictable since last month and she has no way of knowing if she will bleed heavily or when. Advised pt to keep appt and just call if she begins bleeding heavily again right before the appt. Advised I would call back if Dr. Edward Jolly has any further advice.

## 2013-07-22 NOTE — Telephone Encounter (Signed)
I will refill Provera until patient has the ultrasound.  Please inform patient.

## 2013-07-22 NOTE — Telephone Encounter (Signed)
Left message on CB# of need to return call to office for her question of provera.

## 2013-07-22 NOTE — Telephone Encounter (Signed)
I agree with the patient that I would like for her to have the ultrasound when she is not bleeding.  I want her to be on the Provera until after the ultrasound is done.  Do we have the ultrasound precerted and scheduled yet?

## 2013-07-23 NOTE — Telephone Encounter (Signed)
Patient notified of Rx for provera has been called to her pharmacy per Dr. Edward Jolly. Patient understands to continue Provera until patient has the ultrasound on 08/05/2013.

## 2013-07-29 ENCOUNTER — Other Ambulatory Visit: Payer: BC Managed Care – PPO

## 2013-07-29 ENCOUNTER — Other Ambulatory Visit: Payer: BC Managed Care – PPO | Admitting: Obstetrics and Gynecology

## 2013-08-05 ENCOUNTER — Ambulatory Visit (INDEPENDENT_AMBULATORY_CARE_PROVIDER_SITE_OTHER): Payer: BC Managed Care – PPO

## 2013-08-05 ENCOUNTER — Encounter: Payer: Self-pay | Admitting: Obstetrics and Gynecology

## 2013-08-05 ENCOUNTER — Ambulatory Visit (INDEPENDENT_AMBULATORY_CARE_PROVIDER_SITE_OTHER): Payer: BC Managed Care – PPO | Admitting: Obstetrics and Gynecology

## 2013-08-05 DIAGNOSIS — N920 Excessive and frequent menstruation with regular cycle: Secondary | ICD-10-CM

## 2013-08-05 DIAGNOSIS — N921 Excessive and frequent menstruation with irregular cycle: Secondary | ICD-10-CM

## 2013-08-05 DIAGNOSIS — D259 Leiomyoma of uterus, unspecified: Secondary | ICD-10-CM

## 2013-08-05 DIAGNOSIS — N926 Irregular menstruation, unspecified: Secondary | ICD-10-CM

## 2013-08-05 NOTE — Progress Notes (Signed)
  Subjective  Patient here for ultrasound, sonohysterogram, and endometrial biopsy.  Had episode of heavy and prolonged menstrual bleeding. Bleeding stopped since took course of Provera. Has been on for almost 3 weeks pending the ultrasound evaluation.  Some bloating symptoms.  Has had skipped menstrual cycles and hot flashes.   Not interested in taking birth control pills.  Objective  General - patient anxious, smiling.  Pelvic ultrasound below.  2 small fibroids, unremarkable ovaries, EMS 6.19 mm.    Procedures  Sonohysterogram and endometrial biopsy.  Consent performed.  Open sided speculum placed. Sterile prep with betadine. Tenaculum to anterior cervical lip. Catheter placed into endometrial cavity. Speculum withdrawn. Transvaginal probe placed in the vagina.  Saline injected into endometrial cavity. Images obtained.  Open sided speculum replaced into vagina. Pipelle placed inside uterine cavity and tissue obtained after suction applied. Tissue to pathology. Minimal EBL. No complications.  Assessment  Episode of menometrorrhagia. Likely anovulatory bleeding. Perimenopausal female.  Plan  Follow up on endometrial biopsy. Return to discuss in 8 days. OK to stop Provera and expect withdrawal bleed.

## 2013-08-13 ENCOUNTER — Ambulatory Visit (INDEPENDENT_AMBULATORY_CARE_PROVIDER_SITE_OTHER): Payer: BC Managed Care – PPO | Admitting: Obstetrics and Gynecology

## 2013-08-13 ENCOUNTER — Encounter: Payer: Self-pay | Admitting: Obstetrics and Gynecology

## 2013-08-13 VITALS — BP 130/80 | HR 80 | Ht 65.5 in | Wt 193.0 lb

## 2013-08-13 DIAGNOSIS — N970 Female infertility associated with anovulation: Secondary | ICD-10-CM

## 2013-08-13 DIAGNOSIS — N951 Menopausal and female climacteric states: Secondary | ICD-10-CM

## 2013-08-13 DIAGNOSIS — Z1211 Encounter for screening for malignant neoplasm of colon: Secondary | ICD-10-CM

## 2013-08-13 NOTE — Progress Notes (Signed)
Patient ID: Rhonda [name], female   DOB: 1961-01-13, 52 y.o.   MRN: [id number]   Subjective  Patient here for talk today about follow up to ultrasound, sonohysterogram, and endometrial biopsy on 08/05/13. Ultrasound showed 2 [name] fibroids, largest 14 mm, symmetric endometrium, and normal ovaries.  Sonohysterogram showed normal uterine cavity. EMB showed benign weakly proliferative endometrium.  No hyperplasia or malignancy.    Provera stopped 8 days ago and had no withdrawal bleed.  Took this for almost three weeks while waiting for ultrasound.   Menses are usually lasting 4 - 6 days, day number 3 is very heavy.    Thyroid work up normal in November 2013.  Pap normal 01/14/13.  Maternal grandmother died from breast cancer.  Mother died from tracheal carcinoma from second hand smoke.  Patient is fearful of hormones.  Patient states she was stressed this summer and felt this affected her menstruation.    Wants to see a female gastroenterologist for her colonoscopy.    Objective  No exam  Assessment  Episode on menorrhagia. Anovulatory bleeding.  Plan  Will do observational management and keep bleeding calendar. Declines periodic progesterone therapy, OCPs or other hormonal contraception, ablation,Lysteda, hysterectomy etc. Patient knows that she can take ibuprofen or aleve prn heavy menses. Return for heavy or prolonged bleeding. Referral to Dr. Loreta Ave for screening colonoscopy.

## 2013-08-13 NOTE — Patient Instructions (Signed)

## 2013-08-26 ENCOUNTER — Telehealth: Payer: Self-pay | Admitting: Obstetrics and Gynecology

## 2013-08-26 NOTE — Telephone Encounter (Signed)
[name] at Dr. Kenna Gilbert office calling requesting lab for hemoglobin from [facility] 07/15/13 please?

## 2013-08-27 NOTE — Telephone Encounter (Signed)
Spoke with [name] to report pt has not had a CBC done here since 05-2009. Pt only had HGB. [name] was able to look in Epic and see the results.

## 2013-08-27 NOTE — Telephone Encounter (Signed)
Patient needs have her results for blood work sent to Dr. Loreta Ave . [facility]  [phone].  CBC results

## 2013-09-02 ENCOUNTER — Telehealth: Payer: Self-pay | Admitting: *Deleted

## 2013-09-02 ENCOUNTER — Telehealth: Payer: Self-pay

## 2013-09-02 DIAGNOSIS — E785 Hyperlipidemia, unspecified: Secondary | ICD-10-CM

## 2013-09-02 NOTE — Telephone Encounter (Signed)
Patient calling with questions about blood work that we did at office visit in July.  Patient knows we checked her "blood count" so she doesn't want to repeat it.  She was seen by Dr Edward Jolly for menorrhagia and Hgb was done to check for anemia and since it was normal no additional testing was needed.  Difference in Hgb versus CBC and why CBC would be needed for procedure explained.  Patient reports that her visit with Dr Loreta Ave felt rushed and incomplete due to emergency within their office that day.  She is understanding of the emergency but is now scheduled for colonoscopy with dr Loreta Ave that she does not feel prepared for. Patient also has call in to PCP about labs done there in April and if these will work for procedure. Advised these will probably not be recent enough but she is scheduled to see PCP on 09-21-13. Advised if they can not do lab work it can probably be done here. Will follow up with Dr Kenna Gilbert office to discuss patient concerns.

## 2013-09-02 NOTE — Telephone Encounter (Signed)
Dr Loreta Ave called me personally to let me know that she had called patient several times previously in effort to answer any/all questions for her.  She called patient again today directly and went over procedure and her reasons for lab work as ordered.  She offered patient a referral to another provider if she didnt feel her needs were met and patient declined.

## 2013-09-02 NOTE — Telephone Encounter (Signed)
These orders are fine. She should be fasting. Please add a lipid panel to the labs.

## 2013-09-02 NOTE — Telephone Encounter (Signed)
Patient states that she has an appointment with Dr. Cleta Alberts in October. Her gastroenterologist would like her to have a CBC, Liver Panel, and BMP completed when she comes in. Patient is curious as to whether she should be fasting for this and if there is anything else special she needs to do. [phone] (home) or [phone] (cell).

## 2013-09-02 NOTE — Telephone Encounter (Signed)
Can we go ahead and put in the order for the labs? I have pended, do we need any others? Patient advised

## 2013-09-03 NOTE — Telephone Encounter (Signed)
Thank you for assisting in the care of this patient.   The patient will need to decide at this point if she will proceed or not with the colonoscopy.  If she asks me for another referral, I will assist her.

## 2013-09-03 NOTE — Telephone Encounter (Signed)
Patient advised, her appt with you is in October, she is not scheduled for her colonoscopy until November, she is concerned if she has drawn in October, they may need to repeat her CBC again,she may need a separate lab only visit for the CBC, I  told her this is fine, we can do this for her. To you FYI

## 2013-09-07 ENCOUNTER — Ambulatory Visit: Payer: Self-pay | Admitting: Emergency Medicine

## 2013-09-21 ENCOUNTER — Telehealth: Payer: Self-pay | Admitting: *Deleted

## 2013-09-21 ENCOUNTER — Ambulatory Visit (INDEPENDENT_AMBULATORY_CARE_PROVIDER_SITE_OTHER): Payer: BC Managed Care – PPO | Admitting: Emergency Medicine

## 2013-09-21 VITALS — BP 131/81 | HR 74 | Temp 98.6°F | Resp 16 | Ht 65.5 in | Wt 190.0 lb

## 2013-09-21 DIAGNOSIS — N949 Unspecified condition associated with female genital organs and menstrual cycle: Secondary | ICD-10-CM

## 2013-09-21 DIAGNOSIS — N938 Other specified abnormal uterine and vaginal bleeding: Secondary | ICD-10-CM

## 2013-09-21 DIAGNOSIS — Z1211 Encounter for screening for malignant neoplasm of colon: Secondary | ICD-10-CM

## 2013-09-21 DIAGNOSIS — E785 Hyperlipidemia, unspecified: Secondary | ICD-10-CM

## 2013-09-21 DIAGNOSIS — Z23 Encounter for immunization: Secondary | ICD-10-CM

## 2013-09-21 MED ORDER — CLONAZEPAM 0.5 MG PO TABS: ORAL_TABLET | ORAL | Status: DC

## 2013-09-21 NOTE — Telephone Encounter (Signed)
Faxed prescription to [facility] for clonazepam 0.5 ml tablet, per Dr Cleta Alberts. Confirmation page was not received.

## 2013-09-21 NOTE — Progress Notes (Signed)
  Subjective:    Patient ID: Rhonda [name], female    DOB: 08/05/1961, 52 y.o.   MRN: [id number]  HPI Patient here for followup of her hyperlipidemia. She was referred to Dr. Loreta Ave for screening colonoscopy but that appointment did not go well. She is here today to discuss possible referral to a different provider. She saw Dr. Elease Hashimoto  last spring. He told her with the LDL particle size she had it was not essential that she take a statin . The patient is not fasting today. She did not want to have her blood work done and then to have it repeated prior to a colonoscopy    Review of Systems     Objective:   Physical Exam patient is alert cooperative in no distress. Her neck is supple. Chest clear heart regular rate no murmurs abdomen soft nontender        Assessment & Plan:  I made a referral to Dr. Julio Alm because she wants to see a female provider for her colonoscopy. She is scheduled to see me in January we'll do her blood work at that time. She is to continue a low-fat diet in the interim. She wants a referral in January to a GI specialist and that this would be financially better for her

## 2013-11-05 ENCOUNTER — Encounter: Payer: Self-pay | Admitting: Emergency Medicine

## 2013-12-20 ENCOUNTER — Other Ambulatory Visit: Payer: Self-pay

## 2013-12-20 DIAGNOSIS — Z1231 Encounter for screening mammogram for malignant neoplasm of breast: Secondary | ICD-10-CM

## 2013-12-21 ENCOUNTER — Ambulatory Visit: Payer: BC Managed Care – PPO | Admitting: Emergency Medicine

## 2014-01-11 ENCOUNTER — Ambulatory Visit
Admission: RE | Admit: 2014-01-11 | Discharge: 2014-01-11 | Disposition: A | Discharge status: Home / Self Care | Payer: BC Managed Care – PPO | Source: Ambulatory Visit

## 2014-01-11 DIAGNOSIS — Z1231 Encounter for screening mammogram for malignant neoplasm of breast: Secondary | ICD-10-CM

## 2014-01-19 ENCOUNTER — Encounter: Payer: Self-pay | Admitting: Obstetrics and Gynecology

## 2014-01-19 ENCOUNTER — Ambulatory Visit (INDEPENDENT_AMBULATORY_CARE_PROVIDER_SITE_OTHER): Payer: BC Managed Care – PPO | Admitting: Obstetrics and Gynecology

## 2014-01-19 VITALS — BP 131/70 | HR 76 | Resp 16 | Ht 65.75 in | Wt 191.0 lb

## 2014-01-19 DIAGNOSIS — Z01419 Encounter for gynecological examination (general) (routine) without abnormal findings: Secondary | ICD-10-CM

## 2014-01-19 DIAGNOSIS — Z0000 Encounter for general adult medical examination without abnormal findings: Secondary | ICD-10-CM

## 2014-01-19 LAB — POCT URINALYSIS DIPSTICK
Bilirubin, UA: NEGATIVE
Blood, UA: NEGATIVE
Glucose, UA: NEGATIVE
KETONES UA: NEGATIVE
Leukocytes, UA: NEGATIVE
Nitrite, UA: NEGATIVE
PH UA: 7
PROTEIN UA: NEGATIVE
UROBILINOGEN UA: NEGATIVE

## 2014-01-19 NOTE — Patient Instructions (Signed)

## 2014-01-19 NOTE — Progress Notes (Signed)
GYNECOLOGY VISIT  PCP: Ivar Bury, MD  Referring provider:   HPI: 53 y.o.   Married  Caucasian  female   [phone] with Patient's last menstrual period was 01/11/2014.   here for annual examination.  LMP September, 11/04/13 (not heavy) and then spotting on 01/11/14 - for one week.  Some hot flashes and night sweats. Some vaginal dryness.  Takes Calcium 1200 mg daily and then Vit D 2000 IU daily.   No problems with bladder.   Has good bladder control.  Had a cardiac CT scan and EKG with Dr. Johnsie Cancel in 2014 due to family history of cardiovascular disease - normal.  Hgb:  PCP Urine:  neg  GYNECOLOGIC HISTORY: Patient's last menstrual period was 01/11/2014. Sexually active:  monthly Partner preference: female Contraception:   condoms Menopausal hormone therapy: no DES exposure: no   Blood transfusions:  no  Sexually transmitted diseases:   no GYN Procedures:  Endometrial Bx  07/2013 (benign) Mammogram:     01/11/14 normal (TBC)            Pap:   2014 neg - HR HPV testing negative. History of abnormal pap smear: Atypical cells 2004   OB History   Grav Para Term Preterm Abortions TAB SAB Ect Mult Living   3 2 2  1  1   2        LIFESTYLE: Exercise:         Power walking 1 hr a day      Tobacco: no Alcohol: 2-3 glasses of wine a week Drug use:  no  OTHER HEALTH MAINTENANCE: Tetanus/TDap: may be 10 years ago, will check with PCP. Gardisil: no Influenza:  yes Zostavax: no  Bone density: 10 years ago normal Colonoscopy: never, consultation w/ Dr. Collene Mares 08/2013  Cholesterol check: elevated 240   03/2013  Family History  Problem Relation Age of Onset  . Cancer Maternal Grandmother     breast cancer  . Heart disease Maternal Grandfather   . Stroke Brother   . Alcohol abuse Brother   . Liver disease Brother     Patient Active Problem List   Diagnosis Date Noted  . DUB (dysfunctional uterine bleeding) 09/21/2013  . Anxiety   . Other and unspecified hyperlipidemia  04/05/2013   Past Medical History  Diagnosis Date  . Anxiety   . Other and unspecified hyperlipidemia 04/05/2013  . Fibroid     Past Surgical History  Procedure Laterality Date  . Rhinoplasty    . Varicose veins    . Tonsillectomy    . Bladder suspension  2010    Dr.Silva    ALLERGIES: Diflucan  Current Outpatient Prescriptions  Medication Sig Dispense Refill  . Ascorbic Acid (VITAMIN C) 1000 MG tablet Take 1,000 mg by mouth daily.      Marland Kitchen b complex vitamins tablet Take 1 tablet by mouth daily.      . clonazePAM (KLONOPIN) 0.5 MG tablet Take one half to one tablet at night  30 tablet  3  . Coenzyme Q10 (CO Q-10) 100 MG CAPS Take by mouth.      . hydrocortisone 2.5 % cream DAILY      . multivitamin-lutein (OCUVITE-LUTEIN) CAPS Take 1 capsule by mouth daily.      Marland Kitchen OVER THE COUNTER MEDICATION Vitamin D 3 2000 iu taking in the a.m.      . OVER THE COUNTER MEDICATION Flaxseed oil 1000 mg taking 3 times a day      . OVER THE  COUNTER MEDICATION Epax fish oil Omega 3 taking 3 times a day      . OVER THE COUNTER MEDICATION Tocotrienol taking one a day      . Red Yeast Rice 600 MG CAPS Take 600 mg by mouth 2 (two) times daily.      . vitamin E (VITAMIN E) 1000 UNIT capsule Take 1,000 Units by mouth daily.       No current facility-administered medications for this visit.     ROS:  Pertinent items are noted in HPI.  SOCIAL HISTORY:  Hygenist. 2 sons in college.  One applying to graduate school for PhD in music theory.  PHYSICAL EXAMINATION:    BP 131/70  Pulse 76  Resp 16  Ht 5' 5.75" (1.67 m)  Wt 191 lb (86.637 kg)  BMI 31.06 kg/m2  LMP 01/11/2014   Wt Readings from Last 3 Encounters:  01/19/14 191 lb (86.637 kg)  09/21/13 190 lb (86.183 kg)  08/13/13 193 lb (87.544 kg)     Ht Readings from Last 3 Encounters:  01/19/14 5' 5.75" (1.67 m)  09/21/13 5' 5.5" (1.664 m)  08/13/13 5' 5.5" (1.664 m)    General appearance: alert, cooperative and appears stated age Head:  Normocephalic, without obvious abnormality, atraumatic Neck: no adenopathy, supple, symmetrical, trachea midline and thyroid not enlarged, symmetric, no tenderness/mass/nodules Lungs: clear to auscultation bilaterally Breasts: Inspection negative, No nipple retraction or dimpling, No nipple discharge or bleeding, No axillary or supraclavicular adenopathy, Normal to palpation without dominant masses Heart: regular rate and rhythm Abdomen: soft, non-tender; no masses,  no organomegaly Extremities: extremities normal, atraumatic, no cyanosis or edema Skin: Skin color, texture, turgor normal. No rashes or lesions Lymph nodes: Cervical, supraclavicular, and axillary nodes normal. No abnormal inguinal nodes palpated Neurologic: Grossly normal  Pelvic: External genitalia:  no lesions              Urethra:  normal appearing urethra with no masses, tenderness or lesions              Bartholins and Skenes: normal                 Vagina: normal appearing vagina with normal color and discharge, no lesions              Cervix: normal appearance              Pap and high risk HPV testing done: no.            Bimanual Exam:  Uterus:  uterus is normal size, shape, consistency and nontender                                      Adnexa: normal adnexa in size, nontender and no masses                                      Rectovaginal: Confirms                                      Anus:  normal sphincter tone, no lesions  ASSESSMENT  Normal gynecologic exam. Menopausal symptoms. History of midurethral sling.   PLAN  Mammogram yearly. Pap smear and high risk HPV testing in 2019.  Counseled on breast self exam, mammography screening, menopause, adequate intake of calcium and vitamin D, diet and exercise Return annually or prn   An After Visit Summary was printed and given to the patient.

## 2014-03-25 DIAGNOSIS — B351 Tinea unguium: Secondary | ICD-10-CM

## 2014-08-09 ENCOUNTER — Telehealth: Payer: Self-pay | Admitting: Obstetrics and Gynecology

## 2014-08-09 NOTE — Telephone Encounter (Signed)
Patient is asking if Dr.Silva would recommend a female PCP.

## 2014-08-09 NOTE — Telephone Encounter (Signed)
Patient advised of recommendations from Dr. Quincy Simmonds.   Dr. Rita Ohara  Dr. Enzo Bi  Dr. Inda Merlin  She will try them to see if any new patient appointments available. If not, she will keep current pcp. Just had no openings for annual before December. Routing to provider for final review. Patient agreeable to disposition. Will close encounter

## 2014-08-16 ENCOUNTER — Ambulatory Visit: Payer: BC Managed Care – PPO | Admitting: Emergency Medicine

## 2014-10-17 ENCOUNTER — Encounter: Payer: Self-pay | Admitting: Obstetrics and Gynecology

## 2014-11-01 ENCOUNTER — Ambulatory Visit (INDEPENDENT_AMBULATORY_CARE_PROVIDER_SITE_OTHER): Payer: BC Managed Care – PPO | Admitting: Emergency Medicine

## 2014-11-01 ENCOUNTER — Encounter: Payer: Self-pay | Admitting: Emergency Medicine

## 2014-11-01 VITALS — BP 142/84 | HR 73 | Temp 97.8°F | Resp 16 | Ht 66.0 in | Wt 190.6 lb

## 2014-11-01 DIAGNOSIS — I8390 Asymptomatic varicose veins of unspecified lower extremity: Secondary | ICD-10-CM

## 2014-11-01 DIAGNOSIS — R635 Abnormal weight gain: Secondary | ICD-10-CM

## 2014-11-01 DIAGNOSIS — E785 Hyperlipidemia, unspecified: Secondary | ICD-10-CM

## 2014-11-01 DIAGNOSIS — I868 Varicose veins of other specified sites: Secondary | ICD-10-CM

## 2014-11-01 DIAGNOSIS — Z0000 Encounter for general adult medical examination without abnormal findings: Secondary | ICD-10-CM

## 2014-11-01 LAB — POCT URINALYSIS DIPSTICK
Bilirubin, UA: NEGATIVE
Blood, UA: NEGATIVE
Glucose, UA: NEGATIVE
Ketones, UA: NEGATIVE
Nitrite, UA: NEGATIVE
Protein, UA: NEGATIVE
Spec Grav, UA: 1.015
Urobilinogen, UA: 0.2
pH, UA: 8.5

## 2014-11-01 LAB — TSH: TSH: 1.679 u[IU]/mL (ref 0.350–4.500)

## 2014-11-01 LAB — LIPID PANEL
CHOL/HDL RATIO: 4.1 ratio
CHOLESTEROL: 212 mg/dL — AB (ref 0–200)
HDL: 52 mg/dL (ref >39)
LDL Cholesterol: 136 mg/dL — ABNORMAL HIGH (ref 0–99)
Triglycerides: 122 mg/dL (ref <150)
VLDL: 24 mg/dL (ref 0–40)

## 2014-11-01 LAB — POCT UA - MICROSCOPIC ONLY
Casts, Ur, LPF, POC: NEGATIVE
Crystals, Ur, HPF, POC: NEGATIVE
Mucus, UA: NEGATIVE
Yeast, UA: NEGATIVE

## 2014-11-01 LAB — CBC WITH DIFFERENTIAL/PLATELET
BASOS ABS: 0 10*3/uL (ref 0.0–0.1)
BASOS PCT: 0 % (ref 0–1)
Eosinophils Absolute: 0.2 10*3/uL (ref 0.0–0.7)
Eosinophils Relative: 3 % (ref 0–5)
HCT: 44.2 % (ref 36.0–46.0)
Hemoglobin: 15.3 g/dL — ABNORMAL HIGH (ref 12.0–15.0)
Lymphocytes Relative: 22 % (ref 12–46)
Lymphs Abs: 1.3 10*3/uL (ref 0.7–4.0)
MCH: 29.5 pg (ref 26.0–34.0)
MCHC: 34.6 g/dL (ref 30.0–36.0)
MCV: 85.3 fL (ref 78.0–100.0)
MPV: 9.8 fL (ref 9.4–12.4)
Monocytes Absolute: 0.3 10*3/uL (ref 0.1–1.0)
Monocytes Relative: 6 % (ref 3–12)
NEUTROS ABS: 3.9 10*3/uL (ref 1.7–7.7)
Neutrophils Relative %: 69 % (ref 43–77)
PLATELETS: 266 10*3/uL (ref 150–400)
RBC: 5.18 MIL/uL — ABNORMAL HIGH (ref 3.87–5.11)
RDW: 13.3 % (ref 11.5–15.5)
WBC: 5.7 10*3/uL (ref 4.0–10.5)

## 2014-11-01 LAB — COMPLETE METABOLIC PANEL WITH GFR
ALK PHOS: 59 U/L (ref 39–117)
ALT: 21 U/L (ref 0–35)
AST: 23 U/L (ref 0–37)
Albumin: 4.4 g/dL (ref 3.5–5.2)
BUN: 19 mg/dL (ref 6–23)
CO2: 25 mEq/L (ref 19–32)
CREATININE: 0.81 mg/dL (ref 0.50–1.10)
Calcium: 9.4 mg/dL (ref 8.4–10.5)
Chloride: 104 mEq/L (ref 96–112)
GFR, Est African American: >89 mL/min
GFR, Est Non African American: 82 mL/min
GLUCOSE: 101 mg/dL — AB (ref 70–99)
Potassium: 4.3 mEq/L (ref 3.5–5.3)
Sodium: 140 mEq/L (ref 135–145)
Total Bilirubin: 0.7 mg/dL (ref 0.2–1.2)
Total Protein: 7.2 g/dL (ref 6.0–8.3)

## 2014-11-01 NOTE — Progress Notes (Signed)
Subjective:  This chart was scribed for Arlyss Queen, MD by Donato Schultz, Medical Scribe. This patient was seen in Room 21 and the patient's care was started at 8:15 AM.   Patient ID: Rhonda [name], female    DOB: 1960-12-22, 53 y.o.   MRN: [id number]  HPI HPI Comments: Rhonda [name] is a 53 y.o. female who presents to the [facility] for an annual exam.  She is complaining of perimenopausal symptoms that include night sweats, trouble sleeping, and hot flashes.  She is not on any hormone therapy.  She denies chest pain and abdominal pain as associated symptoms.    She recently saw her OB/GYN to discuss her bladder mesh that is in place.  She states that she will have intermittent difficulty passing urine.  She has another appointment with her OB/GYN in a couple of weeks.  Her OB/GYN is Dr. Quincy Simmonds.    She states that she now needs to see a vein specialist due to previous vein stripping procedure that is now causing her swelling and pain in her legs bilaterally.  She does wear support hose.    She had a cardiac screening last year which was normal.  She gets a mammogram done at [facility] every year in January before she sees Dr. Quincy Simmonds.  She has not received a colonoscopy.  She has an eye exam in August and there was no change in her prescription.  She had a skin exam done by Dr. Allyson Sabal in September.  She has received her flu shot.  Her last Tdap was 10 years ago and she needs another vaccine.    She states that her diet is healthy and she does not eat fast food.     Patient Active Problem List   Diagnosis Date Noted  . DUB (dysfunctional uterine bleeding) 09/21/2013  . Anxiety   . Other and unspecified hyperlipidemia 04/05/2013   Past Medical History  Diagnosis Date  . Anxiety   . Other and unspecified hyperlipidemia 04/05/2013  . Fibroid    Past Surgical History  Procedure Laterality Date  . Rhinoplasty    . Varicose veins    . Tonsillectomy    . Bladder  suspension  2010    Dr.Silva   Allergies  Allergen Reactions  . Diflucan [Fluconazole]     Tongue swelling   Prior to Admission medications   Medication Sig Start Date End Date Taking? Authorizing Provider  Ascorbic Acid (VITAMIN C) 1000 MG tablet Take 1,000 mg by mouth daily.    Historical Provider, MD  b complex vitamins tablet Take 1 tablet by mouth daily.    Historical Provider, MD  clonazePAM (KLONOPIN) 0.5 MG tablet Take one half to one tablet at night 09/21/13   Darlyne Russian, MD  Coenzyme Q10 (CO Q-10) 100 MG CAPS Take by mouth.    Historical Provider, MD  hydrocortisone 2.5 % cream DAILY 04/26/13   Historical Provider, MD  multivitamin-lutein (OCUVITE-LUTEIN) CAPS Take 1 capsule by mouth daily.    Historical Provider, MD  OVER THE COUNTER MEDICATION Vitamin D 3 2000 iu taking in the a.m.    Historical Provider, MD  OVER THE COUNTER MEDICATION Flaxseed oil 1000 mg taking 3 times a day    Historical Provider, MD  OVER THE COUNTER MEDICATION Epax fish oil Omega 3 taking 3 times a day    Historical Provider, MD  OVER THE COUNTER MEDICATION Tocotrienol taking one a day  Historical Provider, MD  Red Yeast Rice 600 MG CAPS Take 600 mg by mouth 2 (two) times daily.    Historical Provider, MD  vitamin E (VITAMIN E) 1000 UNIT capsule Take 1,000 Units by mouth daily.    Historical Provider, MD   History   Social History  . Marital Status: Married    Spouse Name: N/A    Number of Children: N/A  . Years of Education: N/A   Occupational History  . Registered Dental Hygienist    Social History Main Topics  . Smoking status: Never Smoker   . Smokeless tobacco: Never Used  . Alcohol Use: 1.5 - 2.0 oz/week    3-4 drink(s) per week     Comment: 3-4 glasses of wine a week  . Drug Use: No  . Sexual Activity:    Partners: Male    Birth Control/ Protection: Condom   Other Topics Concern  . Not on file   Social History Narrative   Married. Education: college. Exercise: walk for 1  hour everyday.     Review of Systems  Constitutional: Positive for diaphoresis.  Cardiovascular: Negative for chest pain.  Gastrointestinal: Negative for abdominal pain.  Musculoskeletal: Positive for arthralgias.  Psychiatric/Behavioral: Positive for sleep disturbance.  All other systems reviewed and are negative.   Objective:  Physical Exam  Constitutional: She is oriented to person, place, and time. She appears well-developed and well-nourished.  HENT:  Head: Normocephalic and atraumatic.  Right Ear: External ear normal.  Left Ear: External ear normal.  Nose: Nose normal.  Mouth/Throat: Oropharynx is clear and moist. No oropharyngeal exudate.  Eyes: Conjunctivae and EOM are normal. Pupils are equal, round, and reactive to light. No scleral icterus.  Neck: Normal range of motion. Neck supple. Carotid bruit is not present. No thyromegaly present.  Cardiovascular: Normal rate, regular rhythm and normal heart sounds.   No murmur heard. Pulmonary/Chest: Effort normal and breath sounds normal. No respiratory distress. She has no wheezes. She has no rales.  Abdominal: Soft. Bowel sounds are normal. There is no tenderness. There is no CVA tenderness.  Musculoskeletal: Normal range of motion.  Lymphadenopathy:    She has no cervical adenopathy.  Neurological: She is alert and oriented to person, place, and time.  Skin: Skin is warm and dry.  Psychiatric: She has a normal mood and affect. Her behavior is normal.  Nursing note and vitals reviewed. EXT she has bilateral varicose veins with venous stasis changes of the lower extremities   BP 142/84 mmHg  Pulse 73  Temp(Src) 97.8 F (36.6 C) (Oral)  Resp 16  Ht 5\' 6"  (1.676 m)  Wt 190 lb 9.6 oz (86.456 kg)  BMI 30.78 kg/m2  SpO2 100%  LMP  (Within Months) Assessment & Plan:  Lipid panel to be done today.  BP borderline high.  Encouraged weight loss, exercise, salt restriction.  She had a tetanus in 2007.  Will give a Tdap in  2017.  She is UTD on her flu vaccine.  She has regular GYN screening with Dr. Quincy Simmonds.  She had cardiac screening last year which was normal.  Referral made to Dr. Aleda Grana to help with her venous disease.  I personally performed the services described in this documentation, which was scribed in my presence. The recorded information has been reviewed and is accurate.

## 2014-11-01 NOTE — Addendum Note (Signed)
Addended by: Yvette Rack on: 11/01/2014 10:22 AM   Modules accepted: Orders

## 2014-11-02 ENCOUNTER — Telehealth: Payer: Self-pay

## 2014-11-02 NOTE — Telephone Encounter (Signed)
Patient was told about referral appointment with Dr. Renaldo Reel and she inquired about lab results. Please call when reviewed by Dr. Everlene Farrier. Thank you! Patient is waiting for results.   Best: [phone]

## 2014-11-05 NOTE — Progress Notes (Signed)
LMOM to CB on both numbers

## 2014-12-01 ENCOUNTER — Other Ambulatory Visit: Payer: Self-pay

## 2014-12-01 DIAGNOSIS — Z1231 Encounter for screening mammogram for malignant neoplasm of breast: Secondary | ICD-10-CM

## 2015-01-09 ENCOUNTER — Telehealth: Payer: Self-pay

## 2015-01-09 DIAGNOSIS — I8390 Asymptomatic varicose veins of unspecified lower extremity: Secondary | ICD-10-CM

## 2015-01-09 NOTE — Telephone Encounter (Signed)
Dr. Everlene Farrier, do you have any other suggestions for vascular surgeons and I can check to see if they are in network.

## 2015-01-09 NOTE — Telephone Encounter (Signed)
Pt of Dr. Everlene Farrier was seen on 11/17 and discussed seeing a vascular Surgeon, she has an Appt. With Dr.Featherston at [facility], but after doing some research on him she feels she would rather see someone who specializes in veins and leg pain. Pt has [facility] and would also like someone in network. Please advise.

## 2015-01-10 NOTE — Telephone Encounter (Signed)
See if patient can see Dr. Scot Dock  or [facility]

## 2015-01-10 NOTE — Telephone Encounter (Signed)
Lm for rtn call 

## 2015-01-11 NOTE — Telephone Encounter (Signed)
Patient called stated she wants to go ahead and be referred to Dr Scot Dock at [facility]. She has checked with her insurance and she was told this office was covered. Patients call back number is [phone]

## 2015-01-12 NOTE — Telephone Encounter (Signed)
I put in referral to Dr. Scot Dock. Thanks.

## 2015-01-13 ENCOUNTER — Other Ambulatory Visit: Payer: Self-pay

## 2015-01-13 ENCOUNTER — Ambulatory Visit
Admission: RE | Admit: 2015-01-13 | Discharge: 2015-01-13 | Disposition: A | Discharge status: Home / Self Care | Payer: BLUE CROSS/BLUE SHIELD | Source: Ambulatory Visit

## 2015-01-13 DIAGNOSIS — Z1231 Encounter for screening mammogram for malignant neoplasm of breast: Secondary | ICD-10-CM

## 2015-01-16 ENCOUNTER — Other Ambulatory Visit: Payer: Self-pay

## 2015-01-16 DIAGNOSIS — I83893 Varicose veins of bilateral lower extremities with other complications: Secondary | ICD-10-CM

## 2015-01-18 ENCOUNTER — Telehealth: Payer: Self-pay | Admitting: Obstetrics and Gynecology

## 2015-01-18 NOTE — Telephone Encounter (Signed)
Left message regarding upcoming appointment has been canceled and needs to be rescheduled. °

## 2015-01-18 NOTE — Telephone Encounter (Signed)
Pt returned call. Scheduled for 2/15 @830  am  Pt agreeable

## 2015-01-23 ENCOUNTER — Ambulatory Visit: Payer: BC Managed Care – PPO | Admitting: Obstetrics and Gynecology

## 2015-01-30 ENCOUNTER — Ambulatory Visit: Payer: Self-pay | Admitting: Obstetrics and Gynecology

## 2015-01-31 ENCOUNTER — Telehealth: Payer: Self-pay

## 2015-01-31 DIAGNOSIS — H40052 Ocular hypertension, left eye: Secondary | ICD-10-CM

## 2015-01-31 NOTE — Telephone Encounter (Signed)
I called pt to see what opinion the optometrist gave her. LMOM to call back.

## 2015-01-31 NOTE — Telephone Encounter (Signed)
Referral placed. Pt notified.  

## 2015-01-31 NOTE — Telephone Encounter (Signed)
Pt is needing a referral to an opthamologist as a second opinion from the optometrist

## 2015-01-31 NOTE — Telephone Encounter (Signed)
Spoke with pt, her optometrist wanted to put pt on Rx drops but the drops are $158.00 per month. She states plus her eye pressure had not changed in three years. She states she gave her Alphagan 0.1%. She would like to run this by an opthamologist first. Her friend told her about Dr.Roy Customer service manager. Please advise.

## 2015-01-31 NOTE — Telephone Encounter (Signed)
That would be fine. If she wants to see Dr. Venetia Maxon put in a referral to him.

## 2015-02-13 ENCOUNTER — Ambulatory Visit (INDEPENDENT_AMBULATORY_CARE_PROVIDER_SITE_OTHER): Payer: BLUE CROSS/BLUE SHIELD | Admitting: Obstetrics and Gynecology

## 2015-02-13 ENCOUNTER — Encounter: Payer: Self-pay | Admitting: Obstetrics and Gynecology

## 2015-02-13 VITALS — BP 142/80 | HR 70 | Resp 16 | Ht 65.5 in | Wt 195.4 lb

## 2015-02-13 DIAGNOSIS — Z01419 Encounter for gynecological examination (general) (routine) without abnormal findings: Secondary | ICD-10-CM

## 2015-02-13 DIAGNOSIS — Z0000 Encounter for general adult medical examination without abnormal findings: Secondary | ICD-10-CM

## 2015-02-13 DIAGNOSIS — N841 Polyp of cervix uteri: Secondary | ICD-10-CM

## 2015-02-13 LAB — POCT URINALYSIS DIPSTICK
BILIRUBIN UA: NEGATIVE
Blood, UA: NEGATIVE
Glucose, UA: NEGATIVE
KETONES UA: NEGATIVE
LEUKOCYTES UA: NEGATIVE
Nitrite, UA: NEGATIVE
Protein, UA: NEGATIVE
Urobilinogen, UA: NEGATIVE
pH, UA: 5

## 2015-02-13 NOTE — Patient Instructions (Signed)

## 2015-02-13 NOTE — Progress Notes (Signed)
Patient ID: Rhonda [name], female   DOB: 1961/04/18, 54 y.o.   MRN: [id number] 54 y.o. [id number] MarriedCaucasianF here for annual exam.   PCP:  Arlyss Queen, MD  Prior LMP July 2015.  3 hot flashes last year. Having hot flashes.  Doing OK.  Declines medication.   Difficulty with weight loss.  States she eats healthy.   Good control of bladder.  Not having problems.   Seeing a vascular surgeon for bilateral leg pain.   Dr. Shanon Rosser.  Using compression hose.  Has had prior saphenous vein surgery in her left leg. Has done sclerotherapy also.   Older son moved to [place].  Misses him.  Has younger son at home.   Patient's last menstrual period was 12/07/2014 (exact date).          Sexually active: Yes.   female partner The current method of family planning is condoms most of the time.    Exercising: Yes.    walking 45 minutes/day and treadmill 3x/week. Smoker:  no  Health Maintenance: Pap:  2014 wnl:neg HR HPV History of abnormal Pap:  Yes, Ascus 2004 MMG:  01-13-15 heterogeneously dense/nl: [facility]. Colonoscopy:  never BMD:   n/a TDaP:  2007 Screening Labs: Hb today:PCP, Urine today: Neg   reports that she has never smoked. She has never used smokeless tobacco. She reports that she drinks about 1.8 - 2.4 oz of alcohol per week. She reports that she does not use illicit drugs.  Past Medical History  Diagnosis Date  . Anxiety   . Other and unspecified hyperlipidemia 04/05/2013  . Fibroid     Past Surgical History  Procedure Laterality Date  . Rhinoplasty    . Varicose veins    . Tonsillectomy    . Bladder suspension  2010    Dr.Silva    Current Outpatient Prescriptions  Medication Sig Dispense Refill  . Ascorbic Acid (VITAMIN C) 1000 MG tablet Take 1,000 mg by mouth daily.    Marland Kitchen b complex vitamins tablet Take 1 tablet by mouth daily.    . Coenzyme Q10 (CO Q-10) 100 MG CAPS Take by mouth.    . hydrocortisone 2.5 % cream DAILY    . lactobacillus acidophilus  (BACID) TABS tablet Take 1 tablet by mouth daily.    . multivitamin-lutein (OCUVITE-LUTEIN) CAPS Take 1 capsule by mouth daily.    Marland Kitchen OVER THE COUNTER MEDICATION Vitamin D 3 2000 iu taking in the a.m.    . OVER THE COUNTER MEDICATION Flaxseed oil 1000 mg taking 3 times a day    . OVER THE COUNTER MEDICATION Epax fish oil Omega 3 taking 3 times a day    . Red Yeast Rice 600 MG CAPS Take 600 mg by mouth 2 (two) times daily.     No current facility-administered medications for this visit.    Family History  Problem Relation Age of Onset  . Cancer Maternal Grandmother     breast cancer  . Heart disease Maternal Grandfather   . Stroke Brother   . Alcohol abuse Brother   . Liver disease Brother     ROS:  Pertinent items are noted in HPI.  Otherwise, a comprehensive ROS was negative.  Exam:   BP 142/80 mmHg  Pulse 70  Resp 16  Ht 5' 5.5" (1.664 m)  Wt 195 lb 6.4 oz (88.633 kg)  BMI 32.01 kg/m2  LMP 12/07/2014 (Exact Date)    Height:   Height: 5' 5.5" (166.4 cm)  Ht Readings from Last 3 Encounters:  02/13/15 5' 5.5" (1.664 m)  11/01/14 5\' 6"  (1.676 m)  01/19/14 5' 5.75" (1.67 m)    General appearance: alert, cooperative and appears stated age Head: Normocephalic, without obvious abnormality, atraumatic Neck: no adenopathy, supple, symmetrical, trachea midline and thyroid normal to inspection and palpation Lungs: clear to auscultation bilaterally Breasts: normal appearance, no masses or tenderness, Inspection negative, No nipple retraction or dimpling, No nipple discharge or bleeding, No axillary or supraclavicular adenopathy Heart: regular rate and rhythm Abdomen: soft, non-tender; bowel sounds normal; no masses,  no organomegaly Extremities: extremities normal, atraumatic, no cyanosis or edema Skin: Skin color, texture, turgor normal. No rashes or lesions Lymph nodes: Cervical, supraclavicular, and axillary nodes normal. No abnormal inguinal nodes palpated Neurologic: Grossly  normal   Pelvic: External genitalia:  no lesions              Urethra:  normal appearing urethra with no masses, tenderness or lesions              Bartholins and Skenes: normal                 Vagina: normal appearing vagina with normal color and discharge, no lesions              Cervix: 2 cervical polyps, each 4 mm.  One removed with ring forceps after patient permission.  Unable to reach the other one just inside the os.              Pap taken: No. Bimanual Exam:  Uterus:  normal size, contour, position, consistency, mobility, non-tender              Adnexa: no mass, fullness, tenderness               Rectovaginal: Confirms               Anus:  normal sphincter tone, no lesions  Chaperone was present for exam.  A:  Well Woman visit.  History of midurethral sling.  Perimenopausal female.  Cervical polyps.  P:   Mammogram yearly. pap smear not indicated.  Declines HRT. Cervical polyp to pathology. Colonoscopy/colon cancer screening recommended.  She will pursue virtual screening.   return annually or prn

## 2015-02-14 ENCOUNTER — Encounter: Payer: Self-pay | Admitting: Vascular Surgery

## 2015-02-14 LAB — IPS PAP TEST WITH HPV

## 2015-02-15 ENCOUNTER — Ambulatory Visit (HOSPITAL_COMMUNITY)
Admission: RE | Admit: 2015-02-15 | Discharge: 2015-02-15 | Disposition: A | Discharge status: Home / Self Care | Payer: BLUE CROSS/BLUE SHIELD | Source: Ambulatory Visit | Attending: Vascular Surgery | Admitting: Vascular Surgery

## 2015-02-15 ENCOUNTER — Ambulatory Visit (INDEPENDENT_AMBULATORY_CARE_PROVIDER_SITE_OTHER): Payer: BLUE CROSS/BLUE SHIELD | Admitting: Vascular Surgery

## 2015-02-15 ENCOUNTER — Encounter: Payer: Self-pay | Admitting: Vascular Surgery

## 2015-02-15 VITALS — BP 142/73 | HR 74 | Resp 18 | Ht 65.5 in | Wt 192.6 lb

## 2015-02-15 DIAGNOSIS — I83893 Varicose veins of bilateral lower extremities with other complications: Secondary | ICD-10-CM | POA: Insufficient documentation

## 2015-02-15 DIAGNOSIS — B351 Tinea unguium: Secondary | ICD-10-CM

## 2015-02-15 DIAGNOSIS — I872 Venous insufficiency (chronic) (peripheral): Secondary | ICD-10-CM

## 2015-02-15 NOTE — Progress Notes (Signed)
Referred by:  Darlyne Russian, MD [address], [place] [address]  Reason for referral: Varicose veins, leg swelling.   History of Present Illness  Rhonda [name] is a 54 y.o. (09-Jun-1961) female who presents with chief complaint: varicose veins and leg swelling. She previously had left greater saphenous vein stripping in and "ligations" and her right leg veins in 1995. She had ultrasound guided sclerotherapy of her right leg veins in 2004. Patient notes reoccurrence of symptoms ten years ago. The patient's symptoms include: throbbing, aching, itching and heaviness bilaterally. Her symptoms are worse at the end of the day after she's been on her feet. She wears compression stockings at the end of the day and also elevates her legs for relief. The patient is active and walks three times a day. 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 The patient has had no history of DVT or ulcers. She has noticed some mild skin changes to her lower legs bilaterally.  There is  family history of venous disorders.    On ROS, she denies any claudication or rest pain. Full ROS below.  Past Medical History  Diagnosis Date  . Anxiety   . Other and unspecified hyperlipidemia 04/05/2013  . Fibroid   . Leg pain, bilateral     Past Surgical History  Procedure Laterality Date  . Rhinoplasty    . Varicose veins    . Tonsillectomy    . Bladder suspension  2010    Dr.Silva  . Vein ligation and stripping  1995    History   Social History  . Marital Status: Married    Spouse Name: N/A  . Number of Children: N/A  . Years of Education: N/A   Occupational History  . Registered Dental Hygienist    Social History Main Topics  . Smoking status: Never Smoker   . Smokeless tobacco: Never Used  . Alcohol Use: 1.8 - 2.4 oz/week    3-4 Standard drinks or equivalent per week     Comment: 3-4 glasses of wine a week  . Drug Use: No  . Sexual Activity:    Partners: Male    Birth Control/  Protection: Condom   Other Topics Concern  . Not on file   Social History Narrative   Married. Education: college. Exercise: walk for 1 hour everyday.    Family History  Problem Relation Age of Onset  . Cancer Maternal Grandmother     breast cancer  . Heart disease Maternal Grandfather   . Stroke Brother   . Alcohol abuse Brother   . Liver disease Brother   . Varicose Veins Brother   . Heart disease Brother   . Cancer Mother   . Varicose Veins Father   . Heart disease Father   . Varicose Veins Paternal Grandmother     Current Outpatient Prescriptions on File Prior to Visit  Medication Sig Dispense Refill  . Ascorbic Acid (VITAMIN C) 1000 MG tablet Take 1,000 mg by mouth daily.    Marland Kitchen b complex vitamins tablet Take 1 tablet by mouth daily.    . Coenzyme Q10 (CO Q-10) 100 MG CAPS Take by mouth.    . hydrocortisone 2.5 % cream DAILY    . lactobacillus acidophilus (BACID) TABS tablet Take 1 tablet by mouth daily.    . multivitamin-lutein (OCUVITE-LUTEIN) CAPS Take 1 capsule by mouth daily.    Marland Kitchen OVER THE COUNTER MEDICATION Vitamin D 3 2000 iu taking in  the a.m.    . OVER THE COUNTER MEDICATION Flaxseed oil 1000 mg taking 3 times a day    . OVER THE COUNTER MEDICATION Epax fish oil Omega 3 taking 3 times a day    . Red Yeast Rice 600 MG CAPS Take 600 mg by mouth 2 (two) times daily.     No current facility-administered medications on file prior to visit.    Allergies  Allergen Reactions  . Diflucan [Fluconazole]     Tongue swelling   REVIEW OF SYSTEMS:  (Positives checked otherwise negative)  CARDIOVASCULAR:  []  chest pain, []  chest pressure, []  palpitations, []  shortness of breath when laying flat, []  shortness of breath with exertion,  []  pain in feet when walking, []  pain in feet when laying flat, []  history of blood clot in veins (DVT), []  history of phlebitis, []  swelling in legs, [x]  varicose veins  PULMONARY:  []  productive cough, []  asthma, []  wheezing  NEUROLOGIC:   []  weakness in arms or legs, []  numbness in arms or legs, []  difficulty speaking or slurred speech, []  temporary loss of vision in one eye, []  dizziness  HEMATOLOGIC:  []  bleeding problems, []  problems with blood clotting too easily  MUSCULOSKEL:  []  joint pain, []  joint swelling  GASTROINTEST:  []  vomiting blood, []  blood in stool     GENITOURINARY:  []  burning with urination, []  blood in urine  PSYCHIATRIC:  []  history of major depression  INTEGUMENTARY:  []  rashes, []  ulcers  CONSTITUTIONAL:  []  fever, []  chills   Physical Examination Filed Vitals:   02/15/15 1459  BP: 142/73  Pulse: 74  Resp: 18  Height: 5' 5.5" (1.664 m)  Weight: 192 lb 9.6 oz (87.363 kg)   Body mass index is 31.55 kg/(m^2).  General: A&O x 3, WDWN female in NAD  Head: [place]/AT  Neck: Supple, no nuchal rigidity  Pulmonary: Sym exp, good air movt, CTAB, no rales, rhonchi, & wheezing  Cardiac: RRR, Nl S1, S2, no Murmurs, rubs or gallops  Vascular: Palpable 2+ femoral, dorsalis pedis and posterior tibial pulses bilaterally   Gastrointestinal: soft, NTND, - HSM, - masses  Musculoskeletal: M/S 5/5 throughout. Extremities without ischemic changes. Very mild hemosiderin staining around ankles bilaterally. No ulcers or wounds. No large tortuous varicose veins. Mild swelling of legs bilaterally.   Neurologic: CN 2-12 grossly intact.  Pain and light touch intact in extremities  Psychiatric: Judgment intact, Mood & affect appropriate for pt's clinical situation   Non-Invasive Vascular Imaging  BLE Venous Insufficiency Duplex (Date: 02/15/2015):   RLE: negative DVT, positive GSV reflux (diameters .37 -.39), postive deep venous reflux  LLE: negative DVT, GSV stripped, positive deep venous reflux  Medical Decision Making  SHAUNDA TIPPING is a 54 y.o. female who presents with: Bilateral lower extremity chronic venous insufficiency. The patient has deep vein reflux bilaterally. There is no evidence of DVT.  She has palpable pedal pulses bilaterally. She had a prior GSV stripping on her left side. She has reflux of the right GSV but the diameters are less than 6 mm. She states that her left leg bothers her more than her right despite study findings. Recommended use of 15-20 mmHg compression stockings and elevation for symptom relief. She is agreeable to this and is not interested any future procedures. She will follow up on as needed basis.   Virgina Jock, PA-C [facility] Office: [phone] Pager: [phone]  02/15/2015, 3:57 PM  This patient was seen and  examined in conjunction with Dr. Scot Dock

## 2015-02-16 LAB — IPS OTHER TISSUE BIOPSY

## 2015-03-06 ENCOUNTER — Telehealth: Payer: Self-pay | Admitting: Obstetrics and Gynecology

## 2015-03-06 NOTE — Telephone Encounter (Signed)
Please report to the patient that she had two tiny endocervical polyps.  I was able to remove one, and it was benign on the final pathology report.  The other one was really small and I could not reach it.  These are benign and not cancerous.  I am not worried about it, and I do not want the patient to worry either.  If she develops post coital bleeding, please have her call the office, and I can reassess then.   Thank you!

## 2015-03-06 NOTE — Telephone Encounter (Signed)
Spoke with patient. Advised patient of message as seen below from [place]. Patient is agreeable and verbalizes understanding.  Routing to provider for final review. Patient agreeable to disposition. Will close encounter

## 2015-03-06 NOTE — Telephone Encounter (Signed)
Spoke with patient. Patient states "I have been meaning to call about the polyp that she left behind. I know she said it was larger than the other ones that she removed. I just want to make sure that it is okay to stay in there. I know she would have probably told me but I want to make sure." Patient denies any current discomfort or bleeding. "Could it be related to my varicosities? I have those in my legs. Could that have been what it was?" Advised patient these diagnosis are two different things. Patient had cervical polyps removed. Patient is agreeable. Advised will need to speak with provider in regards to follow up of polyp and return call. Patient is agreeable.

## 2015-03-06 NOTE — Telephone Encounter (Signed)
Pt has questions regarding her biopsy results.

## 2015-04-06 ENCOUNTER — Telehealth: Payer: Self-pay | Admitting: Obstetrics and Gynecology

## 2015-09-19 ENCOUNTER — Encounter: Payer: Self-pay | Admitting: Emergency Medicine

## 2015-10-09 ENCOUNTER — Telehealth: Payer: Self-pay

## 2015-10-09 NOTE — Telephone Encounter (Addendum)
Pt states she would like to ask Dr Everlene Farrier about this Dr Drema Dallas that came from [place] and is at another practice but wanted to know if he knew anything about her and would he recommend her to be PCP Want to go ahead and find someone before her Dr leave. Please call [phone]

## 2015-10-23 ENCOUNTER — Telehealth: Payer: Self-pay | Admitting: Emergency Medicine

## 2015-10-23 NOTE — Telephone Encounter (Signed)
Call her and tell her to make an appointment at 104 and I can discuss with her who will be her follow-up physician.

## 2015-10-24 NOTE — Telephone Encounter (Signed)
Patient has scheduled a complete physical with Dr Everlene Farrier on 12/12/15.

## 2015-11-24 ENCOUNTER — Telehealth: Payer: Self-pay

## 2015-12-12 ENCOUNTER — Other Ambulatory Visit: Payer: Self-pay

## 2015-12-12 ENCOUNTER — Ambulatory Visit (INDEPENDENT_AMBULATORY_CARE_PROVIDER_SITE_OTHER): Payer: BLUE CROSS/BLUE SHIELD | Admitting: Emergency Medicine

## 2015-12-12 ENCOUNTER — Encounter: Payer: Self-pay | Admitting: Emergency Medicine

## 2015-12-12 VITALS — BP 124/76 | HR 76 | Temp 98.1°F | Resp 16 | Ht 65.75 in | Wt 192.6 lb

## 2015-12-12 DIAGNOSIS — E785 Hyperlipidemia, unspecified: Secondary | ICD-10-CM

## 2015-12-12 DIAGNOSIS — Z0000 Encounter for general adult medical examination without abnormal findings: Secondary | ICD-10-CM

## 2015-12-12 DIAGNOSIS — Z1231 Encounter for screening mammogram for malignant neoplasm of breast: Secondary | ICD-10-CM

## 2015-12-12 DIAGNOSIS — R635 Abnormal weight gain: Secondary | ICD-10-CM | POA: Diagnosis not present

## 2015-12-12 DIAGNOSIS — Z780 Asymptomatic menopausal state: Secondary | ICD-10-CM

## 2015-12-12 LAB — POCT URINALYSIS DIP (MANUAL ENTRY)
BILIRUBIN UA: NEGATIVE
BILIRUBIN UA: NEGATIVE
Blood, UA: NEGATIVE
Glucose, UA: NEGATIVE
Leukocytes, UA: NEGATIVE
Nitrite, UA: NEGATIVE
PH UA: 7
PROTEIN UA: NEGATIVE
Spec Grav, UA: 1.01
UROBILINOGEN UA: 0.2

## 2015-12-12 LAB — COMPLETE METABOLIC PANEL WITH GFR
ALK PHOS: 56 U/L (ref 33–130)
ALT: 23 U/L (ref 6–29)
AST: 25 U/L (ref 10–35)
Albumin: 4.1 g/dL (ref 3.6–5.1)
BILIRUBIN TOTAL: 0.7 mg/dL (ref 0.2–1.2)
BUN: 18 mg/dL (ref 7–25)
CO2: 26 mmol/L (ref 20–31)
CREATININE: 0.74 mg/dL (ref 0.50–1.05)
Calcium: 9.2 mg/dL (ref 8.6–10.4)
Chloride: 102 mmol/L (ref 98–110)
GFR, Est Non African American: >89 mL/min (ref >=60)
GLUCOSE: 83 mg/dL (ref 65–99)
Potassium: 4.1 mmol/L (ref 3.5–5.3)
Sodium: 137 mmol/L (ref 135–146)
TOTAL PROTEIN: 6.7 g/dL (ref 6.1–8.1)

## 2015-12-12 LAB — LIPID PANEL
Cholesterol: 206 mg/dL — ABNORMAL HIGH (ref 125–200)
HDL: 52 mg/dL (ref >=46)
LDL CALC: 132 mg/dL — AB (ref <130)
TRIGLYCERIDES: 110 mg/dL (ref <150)
Total CHOL/HDL Ratio: 4 Ratio (ref <=5.0)
VLDL: 22 mg/dL (ref <30)

## 2015-12-12 LAB — CBC WITH DIFFERENTIAL/PLATELET
BASOS ABS: 0 10*3/uL (ref 0.0–0.1)
Basophils Relative: 0 % (ref 0–1)
EOS ABS: 0.2 10*3/uL (ref 0.0–0.7)
Eosinophils Relative: 3 % (ref 0–5)
HEMATOCRIT: 46 % (ref 36.0–46.0)
Hemoglobin: 15.4 g/dL — ABNORMAL HIGH (ref 12.0–15.0)
LYMPHS ABS: 1.5 10*3/uL (ref 0.7–4.0)
LYMPHS PCT: 26 % (ref 12–46)
MCH: 29.8 pg (ref 26.0–34.0)
MCHC: 33.5 g/dL (ref 30.0–36.0)
MCV: 89.1 fL (ref 78.0–100.0)
MPV: 10.4 fL (ref 8.6–12.4)
Monocytes Absolute: 0.4 10*3/uL (ref 0.1–1.0)
Monocytes Relative: 6 % (ref 3–12)
NEUTROS ABS: 3.8 10*3/uL (ref 1.7–7.7)
Neutrophils Relative %: 65 % (ref 43–77)
PLATELETS: 246 10*3/uL (ref 150–400)
RBC: 5.16 MIL/uL — ABNORMAL HIGH (ref 3.87–5.11)
RDW: 13.1 % (ref 11.5–15.5)
WBC: 5.9 10*3/uL (ref 4.0–10.5)

## 2015-12-12 LAB — TSH: TSH: 2.06 u[IU]/mL (ref 0.350–4.500)

## 2015-12-12 LAB — POC MICROSCOPIC URINALYSIS (UMFC): Mucus: ABSENT

## 2015-12-12 NOTE — Progress Notes (Signed)
   Subjective:    Patient ID: Rhonda [name], female    DOB: 07-16-1961, 54 y.o.   MRN: [id number]  HPI    Review of Systems  Constitutional: Negative.   HENT: Negative.   Eyes: Negative.   Respiratory: Negative.   Cardiovascular: Negative.   Gastrointestinal: Negative.   Endocrine: Negative.   Genitourinary: Negative.   Musculoskeletal: Negative.   Skin: Negative.   Allergic/Immunologic: Negative.   Neurological: Negative.   Hematological: Negative.   Psychiatric/Behavioral: Negative.        Objective:   Physical Exam        Assessment & Plan:

## 2015-12-12 NOTE — Progress Notes (Signed)
By signing my name below, I, Judithe Modest, attest that this documentation has been prepared under the direction and in the presence of Nena Jordan, MD. Electronically Signed: Judithe Modest, ER Scribe. 12/12/2015. 2:22 PM.  Chief Complaint:  Chief Complaint  Patient presents with  . Annual Exam    no pap smear   HPI: Rhonda [name] is a 54 y.o. female who reports to [facility] today for a regular checkup. She is seeing Dr. Quincy Simmonds for menopause sx. She is suffering from sweats, breast tenderness, and insomnia. She is looking for a female internist physician, and is planning on seeing Dr. Billey Gosling.   Past Medical History  Diagnosis Date  . Anxiety   . Other and unspecified hyperlipidemia 04/05/2013  . Fibroid   . Leg pain, bilateral   . Allergy     seasonal   Past Surgical History  Procedure Laterality Date  . Rhinoplasty    . Varicose veins    . Tonsillectomy    . Bladder suspension  2010    Dr.Silva  . Vein ligation and stripping  1995   Social History   Social History  . Marital Status: Married    Spouse Name: N/A  . Number of Children: N/A  . Years of Education: N/A   Occupational History  . Registered Dental Hygienist    Social History Main Topics  . Smoking status: Never Smoker   . Smokeless tobacco: Never Used  . Alcohol Use: 1.8 - 2.4 oz/week    3-4 Standard drinks or equivalent per week     Comment: 3-4 glasses of wine a week  . Drug Use: No  . Sexual Activity:    Partners: Male    Birth Control/ Protection: Condom   Other Topics Concern  . None   Social History Narrative   Married. Education: college. Exercise: walk for 1 hour everyday.   Family History  Problem Relation Age of Onset  . Cancer Maternal Grandmother     breast cancer  . Heart disease Maternal Grandfather   . Stroke Brother   . Alcohol abuse Brother   . Liver disease Brother   . Varicose Veins Brother   . Heart disease Brother   . Cancer Mother   . Varicose Veins Father     . Heart disease Father   . Varicose Veins Paternal Grandmother    Allergies  Allergen Reactions  . Diflucan [Fluconazole]     Tongue swelling   Prior to Admission medications   Medication Sig Start Date End Date Taking? Authorizing Provider  Ascorbic Acid (VITAMIN C) 1000 MG tablet Take 1,000 mg by mouth daily.   Yes Historical Provider, MD  b complex vitamins tablet Take 1 tablet by mouth daily.   Yes Historical Provider, MD  Coenzyme Q10 (CO Q-10) 100 MG CAPS Take by mouth.   Yes Historical Provider, MD  hydrocortisone 2.5 % cream DAILY 04/26/13  Yes Historical Provider, MD  lactobacillus acidophilus (BACID) TABS tablet Take 1 tablet by mouth daily. Reported on 12/12/2015   Yes Historical Provider, MD  multivitamin-lutein [facility]) CAPS Take 1 capsule by mouth daily.   Yes Historical Provider, MD  OVER THE COUNTER MEDICATION Vitamin D 3 2000 iu taking in the a.m.   Yes Historical Provider, MD  OVER THE COUNTER MEDICATION Flaxseed oil 1000 mg taking 3 times a day   Yes Historical Provider, MD  OVER THE COUNTER MEDICATION Epax fish oil Omega 3 taking 3 times a day  Yes Historical Provider, MD  Red Yeast Rice 600 MG CAPS Take 600 mg by mouth 2 (two) times daily.   Yes Historical Provider, MD     ROS: The patient denies fevers, chills, night sweats, unintentional weight loss, chest pain, palpitations, wheezing, dyspnea on exertion, nausea, vomiting, abdominal pain, dysuria, hematuria, melena, numbness, weakness, or tingling.   All other systems have been reviewed and were otherwise negative with the exception of those mentioned in the HPI and as above.    PHYSICAL EXAM: Filed Vitals:   12/12/15 1341  BP: 124/76  Pulse: 76  Temp: 98.1 F (36.7 C)  Resp: 16   Body mass index is 31.33 kg/(m^2).   General: Alert, no acute distress HEENT:  Normocephalic, atraumatic, oropharynx patent. Eye: Juliette Mangle [facility] Cardiovascular:  Regular rate and rhythm, no rubs murmurs or gallops.   No Carotid bruits, radial pulse intact. No pedal edema.  Respiratory: Clear to auscultation bilaterally.  No wheezes, rales, or rhonchi.  No cyanosis, no use of accessory musculature Abdominal: No organomegaly, abdomen is soft and non-tender, positive bowel sounds.  No masses. Musculoskeletal: Gait intact. No edema, tenderness Skin: Ruddy complected. 8cm lipoma right upper medial abdomen. Bilateral vericose veins with stasis changes Neurologic: Facial musculature symmetric. Psychiatric: Patient acts appropriately throughout our interaction. Lymphatic: No cervical or submandibular lymphadenopathy   No masses,   Anxious  Pelvic deferred   LABS:  EKG/XRAY:   Primary read interpreted by Dr. Everlene Farrier at [facility].  ASSESSMENT/PLAN: Routine labs were done today. She seemed in good today. She is battling with menopausal symptoms but has an OB/GYN to help her with this. She is not much for taking medication. She will continue to try to exercise and work on her weight. We'll see what her latest lipid panel shows.I personally performed the services described in this documentation, which was scribed in my presence. The recorded information has been reviewed and is accurate.  Gross sideeffects, risk and benefits, and alternatives of medications d/w patient. Patient is aware that all medications have potential sideeffects and we are unable to predict every sideeffect or drug-drug interaction that may occur.  Arlyss Queen MD 12/12/2015 2:22 PM

## 2015-12-13 ENCOUNTER — Other Ambulatory Visit: Payer: Self-pay | Admitting: Emergency Medicine

## 2015-12-13 DIAGNOSIS — D751 Secondary polycythemia: Secondary | ICD-10-CM

## 2015-12-21 ENCOUNTER — Telehealth: Payer: Self-pay | Admitting: Obstetrics and Gynecology

## 2015-12-21 NOTE — Telephone Encounter (Signed)
Patient is in peri menopause and she started spotting today. She states her breasts are tender and she is having cramping, shaky and  moody.

## 2015-12-21 NOTE — Telephone Encounter (Signed)
Call to patient. She is given message from Dr. Quincy Simmonds.  Agreeable to disposition of office visit with Dr. Quincy Simmonds, scheduled for tomorrow at 1245. She is advised of message regarding PCP and she will continue to research, also given [place] information to call for information as patient states she is having a hard time getting in at [employer] [facility].   Patient advised to call back with any concerns and verbalizes understanding.  Routing to provider for final review. Patient agreeable to disposition. Will close encounter.

## 2015-12-21 NOTE — Telephone Encounter (Signed)
Spoke with patient. She states she had a cycle on 12/07/14 and then went until 11/05/15 with no cycle. Cycle on 11/05/15 was light and lasted for 4 days with pre-menstrual symptoms. Today, she has started another cycle and is having spotting only. Experiencing pre-menstrual symptoms again, c/o moodiness. Irritability, cramps, crying and breast tenderness. She states that she c/o breast tenderness to PCP Dr. Everlene Farrier who did a breast exam with last physical on 12/12/15, she states everything was normal on his exam.  Patient is scheduled for screening mammogram on 01/15/16.   Patient declines office visit at this time for evaluation of vaginal bleeding and breast tenderness.  She would like to obtain Dr. Elza Rafter opinion at this time and follow up as necessary.   She also needs a new PCP (due to upcoming retirement of Dr. Everlene Farrier) and wants to know if Dr. Quincy Simmonds knows Dr. Billey Gosling who is a new physician at [facility].  Advised patient will send message to Dr. Quincy Simmonds for advice.

## 2015-12-21 NOTE — Telephone Encounter (Signed)
It all sounds like patient likely has not transitioned into menopause yet, but I would like the opportunity to evaluate with an office visit and blood work to confirm the patient's hormonal/menopausal status due to her age.   I do not have any personal contact with Dr. Quay Burow. She may choose to ask Dr. Perfecto Kingdom opinion if he is still available for contact.

## 2015-12-22 ENCOUNTER — Encounter: Payer: Self-pay | Admitting: Obstetrics and Gynecology

## 2015-12-22 ENCOUNTER — Ambulatory Visit (INDEPENDENT_AMBULATORY_CARE_PROVIDER_SITE_OTHER): Payer: BLUE CROSS/BLUE SHIELD | Admitting: Obstetrics and Gynecology

## 2015-12-22 VITALS — BP 142/84 | HR 80 | Ht 65.5 in | Wt 192.2 lb

## 2015-12-22 DIAGNOSIS — N926 Irregular menstruation, unspecified: Secondary | ICD-10-CM | POA: Diagnosis not present

## 2015-12-22 LAB — CBC
HCT: 46.6 % — ABNORMAL HIGH (ref 36.0–46.0)
Hemoglobin: 15.5 g/dL — ABNORMAL HIGH (ref 12.0–15.0)
MCH: 29.5 pg (ref 26.0–34.0)
MCHC: 33.3 g/dL (ref 30.0–36.0)
MCV: 88.8 fL (ref 78.0–100.0)
MPV: 9.9 fL (ref 8.6–12.4)
PLATELETS: 292 10*3/uL (ref 150–400)
RBC: 5.25 MIL/uL — AB (ref 3.87–5.11)
RDW: 13.2 % (ref 11.5–15.5)
WBC: 7.6 10*3/uL (ref 4.0–10.5)

## 2015-12-22 NOTE — Progress Notes (Signed)
Patient ID: Rhonda [name], female   DOB: Jun 27, 1961, 55 y.o.   MRN: [id number] GYNECOLOGY  VISIT   HPI: 55 y.o.   Married  Caucasian  female   [phone] with Patient's last menstrual period was 12/21/2015 (exact date).   here for abnormal uterine bleeding.  Patient states had a cycle in 12-07-14, 11-05-15 and then began bleeding again yesterday.  Had breast tenderness, little bit of bloating, zitteriness - usual menstrual symptoms for patient.  November menses 2016 was normal.  Prior menses was December 2015.   Menses in 2015 were every 3 months. No spotting in between cycles. Some hot flashes but not bad.   States slightly elevated Hgb in December with PCP. Wants to recheck this today.   GYNECOLOGIC HISTORY: Patient's last menstrual period was 12/21/2015 (exact date). Contraception:condoms Menopausal hormone therapy: n/a Last mammogram: 01-13-15 3D/Density Cat.C/Neg/BiRads1:[facility] Last pap smear: 02-13-15 Neg:Neg HR HPV        OB History    Gravida Para Term Preterm AB TAB SAB Ectopic Multiple Living   3 2 2  1  1   2          Patient Active Problem List   Diagnosis Date Noted  . DUB (dysfunctional uterine bleeding) 09/21/2013  . Anxiety   . Other and unspecified hyperlipidemia 04/05/2013    Past Medical History  Diagnosis Date  . Anxiety   . Other and unspecified hyperlipidemia 04/05/2013  . Fibroid   . Leg pain, bilateral   . Allergy     seasonal    Past Surgical History  Procedure Laterality Date  . Rhinoplasty    . Varicose veins    . Tonsillectomy    . Bladder suspension  2010    Dr.Silva  . Vein ligation and stripping  1995    Current Outpatient Prescriptions  Medication Sig Dispense Refill  . Ascorbic Acid (VITAMIN C) 1000 MG tablet Take 1,000 mg by mouth daily.    Marland Kitchen b complex vitamins tablet Take 1 tablet by mouth daily.    . Coenzyme Q10 (CO Q-10) 100 MG CAPS Take by mouth.    . lactobacillus acidophilus (BACID) TABS tablet Take 1 tablet by  mouth daily. Reported on 12/12/2015    . multivitamin-lutein (OCUVITE-LUTEIN) CAPS Take 1 capsule by mouth daily.    Marland Kitchen OVER THE COUNTER MEDICATION Vitamin D 3 2000 iu taking in the a.m.    . OVER THE COUNTER MEDICATION Flaxseed oil 1000 mg taking 3 times a day    . OVER THE COUNTER MEDICATION Epax fish oil Omega 3 taking 3 times a day    . TRAVATAN Z 0.004 % SOLN ophthalmic solution Place 1 drop into both eyes at bedtime.     No current facility-administered medications for this visit.     ALLERGIES: Diflucan  Family History  Problem Relation Age of Onset  . Cancer Maternal Grandmother     breast cancer  . Heart disease Maternal Grandfather   . Stroke Brother   . Alcohol abuse Brother   . Liver disease Brother   . Varicose Veins Brother   . Heart disease Brother   . Cancer Mother   . Varicose Veins Father   . Heart disease Father   . Varicose Veins Paternal Grandmother     Social History   Social History  . Marital Status: Married    Spouse Name: N/A  . Number of Children: N/A  . Years of Education: N/A   Occupational History  .  Registered Dental Hygienist    Social History Main Topics  . Smoking status: Never Smoker   . Smokeless tobacco: Never Used  . Alcohol Use: 1.8 - 2.4 oz/week    3-4 Standard drinks or equivalent per week     Comment: 3-4 glasses of wine a week  . Drug Use: No  . Sexual Activity:    Partners: Male    Birth Control/ Protection: Condom   Other Topics Concern  . Not on file   Social History Narrative   Married. Education: college. Exercise: walk for 1 hour everyday.    ROS:  Pertinent items are noted in HPI.  PHYSICAL EXAMINATION:    BP 142/84 mmHg  Pulse 80  Ht 5' 5.5" (1.664 m)  Wt 192 lb 3.2 oz (87.181 kg)  BMI 31.49 kg/m2  LMP 12/21/2015 (Exact Date)    General appearance: alert, cooperative and appears stated age.  Talkative.     Pelvic: External genitalia:  no lesions              Urethra:  normal appearing urethra with  no masses, tenderness or lesions              Bartholins and Skenes: normal                 Vagina: normal appearing vagina with normal color and discharge, no lesions.  Menstrual flow noted.              Cervix: no lesions           Bimanual Exam:  Uterus:  normal size, contour, position, consistency, mobility, non-tender              Adnexa: normal adnexa and no mass, fullness, tenderness                 Chaperone was present for exam.  ASSESSMENT  Irregular menses.  Probable perimenopausal. Elevated Hgb with PCP.   PLAN  Counseled regarding perimenopausal bleeding versus postmenopausal bleeding.  Check FSH, estradiol, CBC.  If labs indicate menopause, will proceed with pelvic ultrasound, EMB, and possible sonohysterogram. Keep appointment for annual exam.   An After Visit Summary was printed and given to the patient.  ___15___ minutes face to face time of which over 50% was spent in counseling.

## 2015-12-23 LAB — FOLLICLE STIMULATING HORMONE: FSH: 40.1 m[IU]/mL

## 2015-12-23 LAB — ESTRADIOL: ESTRADIOL: 23.6 pg/mL

## 2015-12-27 ENCOUNTER — Telehealth: Payer: Self-pay

## 2015-12-27 NOTE — Telephone Encounter (Signed)
-----   Message from Nunzio Cobbs, MD sent at 12/26/2015 10:53 AM EST ----- Please inform patient of her lab results.  [place] is elevated into a menopausal range.  Estrogen levels are on the border between menopause and premenopause.  Overall, it looks like she is perimenopausal.  Please keep bleeding calendar, and return for appointment in March 2017.  Call for prolonged bleeding or cycles occurring more often than every 21 days.

## 2015-12-27 NOTE — Telephone Encounter (Signed)
I will ask patient to follow up with her PCP regarding the elevated Hgb.  I did this as a courtesy for her.  It seems that the PCP was planning to refer her to hematology.  I would like her to follow through with their plan through Dr. Perfecto Kingdom office.   Thanks!

## 2015-12-27 NOTE — Telephone Encounter (Signed)
Spoke with patient. Advised of message and results as seen below from [place]. Patient is agreeable and verbalizes understanding. Will keep her appointment for March of 2017. She will keep a bleeding calendar and notify the office if she has prolonged bleeding or cycles that occur more often than every 21 days. Patient is concerned about her hemoglobin. Hemoglobin level is 15.5. Patient was advised by another provider she may need follow up for this. Patient would like to ensure she does not need to do anything for her hemoglobin at this time. Routing to [place] for review and advise.

## 2015-12-27 NOTE — Telephone Encounter (Signed)
Spoke with patient. Advised of message as seen below from [place]. Patient is agreeable. Will follow up with Dr.Daub for further recommendations and evaluation.  Routing to provider for final review. Patient agreeable to disposition. Will close encounter.

## 2015-12-28 ENCOUNTER — Telehealth: Payer: Self-pay

## 2015-12-28 DIAGNOSIS — D45 Polycythemia vera: Secondary | ICD-10-CM

## 2015-12-28 NOTE — Telephone Encounter (Signed)
Went to GYN (Dr Woody Seller) and had her hemoglobin done there and it was around 15 there just like it was here and she wanted to know if Dr Everlene Farrier wanted her to wait longer than 6 weeks, can someone give her a call and let her know what they think is best.  Her call back number is [phone]

## 2015-12-29 NOTE — Telephone Encounter (Signed)
Let's go ahead and check her hemoglobin in 3 months. Please place a future order for that.

## 2015-12-29 NOTE — Telephone Encounter (Signed)
Spoke with pt, advised message from Dr. Everlene Farrier. Pt understood. She states Dr. Quincy Simmonds recommended her a family physician in [facility].

## 2016-01-11 ENCOUNTER — Other Ambulatory Visit: Payer: BLUE CROSS/BLUE SHIELD

## 2016-01-11 ENCOUNTER — Ambulatory Visit: Payer: BLUE CROSS/BLUE SHIELD | Admitting: Hematology & Oncology

## 2016-01-11 ENCOUNTER — Ambulatory Visit: Payer: BLUE CROSS/BLUE SHIELD

## 2016-01-15 ENCOUNTER — Ambulatory Visit
Admission: RE | Admit: 2016-01-15 | Discharge: 2016-01-15 | Disposition: A | Discharge status: Home / Self Care | Payer: BLUE CROSS/BLUE SHIELD | Source: Ambulatory Visit

## 2016-01-15 DIAGNOSIS — Z1231 Encounter for screening mammogram for malignant neoplasm of breast: Secondary | ICD-10-CM

## 2016-01-15 DIAGNOSIS — B351 Tinea unguium: Secondary | ICD-10-CM

## 2016-02-28 ENCOUNTER — Ambulatory Visit (INDEPENDENT_AMBULATORY_CARE_PROVIDER_SITE_OTHER): Payer: BLUE CROSS/BLUE SHIELD | Admitting: Obstetrics and Gynecology

## 2016-02-28 ENCOUNTER — Encounter: Payer: Self-pay | Admitting: Obstetrics and Gynecology

## 2016-02-28 VITALS — BP 152/80 | HR 78 | Ht 65.75 in | Wt 185.0 lb

## 2016-02-28 DIAGNOSIS — Z01419 Encounter for gynecological examination (general) (routine) without abnormal findings: Secondary | ICD-10-CM

## 2016-02-28 NOTE — Progress Notes (Signed)
Patient ID: Rhonda [name], female   DOB: May 06, 1961, 55 y.o.   MRN: [id number] 55 y.o. G52P2012 Married Caucasian female here for annual exam.    No menses since January 2017.  FSH 40.1 and E2 23.6 in January 2017. Prior periods were 12/07/14 and 11/05/15.  Good bladder control.   Son getting married at end of July in [place], [place]. Trying to loose weight for the wedding.  Lost 7 pounds.   Controlling hyperlipidemia with diet.   PCP:  Arlyss Queen, MD.  Will see Dr. Coralyn Mark as a new provider.  No appointment yet.   Patient's last menstrual period was 12/22/2015.          Sexually active: Yes.   husband The current method of family planning is condoms most of the time.    Exercising: Yes.    power walks 1 hr daily. Smoker:  no  Health Maintenance: Pap:  09-11-15 Neg:Neg HR HPV History of abnormal Pap:  Yes, ASCUS 2004 MMG:  01-16-16 3D/Density Cat.C/Neg/BiRads1:[facility] Colonoscopy:  NEVER BMD:  10-17-03 Result  - osteopenia of hip and spine.  TDaP:  2007  Screening Labs:  Hb today: PCP, Urine today: PCP    reports that she has never smoked. She has never used smokeless tobacco. She reports that she drinks about 1.8 - 2.4 oz of alcohol per week. She reports that she does not use illicit drugs.  Past Medical History  Diagnosis Date  . Anxiety   . Other and unspecified hyperlipidemia 04/05/2013  . Fibroid   . Leg pain, bilateral   . Allergy     seasonal    Past Surgical History  Procedure Laterality Date  . Rhinoplasty    . Varicose veins    . Tonsillectomy    . Bladder suspension  2010    Dr.Silva  . Vein ligation and stripping  1995    Current Outpatient Prescriptions  Medication Sig Dispense Refill  . Ascorbic Acid (VITAMIN C) 1000 MG tablet Take 1,000 mg by mouth daily.    Marland Kitchen lactobacillus acidophilus (BACID) TABS tablet Take 1 tablet by mouth daily. Reported on 12/12/2015    . multivitamin-lutein (OCUVITE-LUTEIN) CAPS Take 1 capsule by mouth daily.    Marland Kitchen  OVER THE COUNTER MEDICATION Vitamin D 3 2000 iu taking in the a.m.    . OVER THE COUNTER MEDICATION Flaxseed oil 1000 mg taking 3 times a day    . OVER THE COUNTER MEDICATION Epax fish oil Omega 3 taking 3 times a day    . TRAVATAN Z 0.004 % SOLN ophthalmic solution Place 1 drop into both eyes at bedtime.     No current facility-administered medications for this visit.    Family History  Problem Relation Age of Onset  . Cancer Maternal Grandmother     breast cancer  . Heart disease Maternal Grandfather   . Stroke Brother   . Alcohol abuse Brother   . Liver disease Brother   . Varicose Veins Brother   . Heart disease Brother   . Cancer Mother   . Varicose Veins Father   . Heart disease Father   . Varicose Veins Paternal Grandmother     ROS:  Pertinent items are noted in HPI.  Otherwise, a comprehensive ROS was negative.  Exam:   BP 152/80 mmHg  Pulse 78  Ht 5' 5.75" (1.67 m)  Wt 185 lb (83.915 kg)  BMI 30.09 kg/m2  LMP 12/22/2015    General appearance: alert, cooperative and  appears stated age Head: Normocephalic, without obvious abnormality, atraumatic Neck: no adenopathy, supple, symmetrical, trachea midline and thyroid normal to inspection and palpation Lungs: clear to auscultation bilaterally Breasts: normal appearance, no masses or tenderness, Inspection negative, No nipple retraction or dimpling, No nipple discharge or bleeding, No axillary or supraclavicular adenopathy Heart: regular rate and rhythm Abdomen: soft, non-tender;  subuctaneous mass in epigastric region - 5 cm and nontender (lipoma), no organomegaly Extremities: extremities normal, atraumatic, no cyanosis or edema Skin: Skin color, texture, turgor normal. No rashes or lesions Lymph nodes: Cervical, supraclavicular, and axillary nodes normal. No abnormal inguinal nodes palpated Neurologic: Grossly normal  Pelvic: External genitalia:  no lesions              Urethra:  normal appearing urethra with no  masses, tenderness or lesions              Bartholins and Skenes: normal                 Vagina: normal appearing vagina with normal color and discharge, no lesions              Cervix: no lesions              Pap taken: No. Bimanual Exam:  Uterus:  normal size, contour, position, consistency, mobility, non-tender              Adnexa: no mass, fullness, tenderness              Rectovaginal: Yes.  .  Confirms.              Anus:  normal sphincter tone, no lesions  Chaperone was present for exam.  Assessment:   Well woman visit with normal exam. Perimenopausal female. Hx of mid-urethral sling.  Remote hx of ASCUS.  Remote hx of osteopenia.  Mildly elevated Hgb.  Plan: Yearly mammogram recommended after age 12.  Recommended self breast exam.  Pap and HR HPV as above. Discussed Calcium, Vitamin D, regular exercise program including cardiovascular and weight bearing exercise. Labs performed.  No..   Patient will do follow up of her Hgb with her new PCP.  Refills given on medications.  No..    Call for any future vaginal bleeding.  Follow up annually and prn.      After visit summary provided.

## 2016-02-28 NOTE — Patient Instructions (Signed)

## 2016-03-27 ENCOUNTER — Other Ambulatory Visit: Payer: Self-pay | Admitting: Internal Medicine

## 2016-03-27 DIAGNOSIS — E7800 Pure hypercholesterolemia, unspecified: Secondary | ICD-10-CM | POA: Diagnosis not present

## 2016-03-27 DIAGNOSIS — D582 Other hemoglobinopathies: Secondary | ICD-10-CM | POA: Diagnosis not present

## 2016-03-27 DIAGNOSIS — R911 Solitary pulmonary nodule: Secondary | ICD-10-CM

## 2016-03-27 DIAGNOSIS — Z119 Encounter for screening for infectious and parasitic diseases, unspecified: Secondary | ICD-10-CM | POA: Diagnosis not present

## 2016-04-03 ENCOUNTER — Ambulatory Visit
Admission: RE | Admit: 2016-04-03 | Discharge: 2016-04-03 | Disposition: A | Discharge status: Home / Self Care | Payer: BLUE CROSS/BLUE SHIELD | Source: Ambulatory Visit | Attending: Internal Medicine | Admitting: Internal Medicine

## 2016-04-03 DIAGNOSIS — R911 Solitary pulmonary nodule: Secondary | ICD-10-CM | POA: Diagnosis not present

## 2016-04-03 DIAGNOSIS — R918 Other nonspecific abnormal finding of lung field: Secondary | ICD-10-CM | POA: Diagnosis not present

## 2016-04-03 DIAGNOSIS — E041 Nontoxic single thyroid nodule: Secondary | ICD-10-CM | POA: Diagnosis not present

## 2016-04-03 MED ORDER — IOPAMIDOL (ISOVUE-300) INJECTION 61%
75.0000 mL | Freq: Once | INTRAVENOUS | Status: AC | PRN
  Administered 2016-04-03: 75 mL via INTRAVENOUS

## 2016-08-26 DIAGNOSIS — H401131 Primary open-angle glaucoma, bilateral, mild stage: Secondary | ICD-10-CM | POA: Diagnosis not present

## 2016-09-25 DIAGNOSIS — Z23 Encounter for immunization: Secondary | ICD-10-CM | POA: Diagnosis not present

## 2016-09-26 DIAGNOSIS — F411 Generalized anxiety disorder: Secondary | ICD-10-CM | POA: Diagnosis not present

## 2016-09-26 DIAGNOSIS — R634 Abnormal weight loss: Secondary | ICD-10-CM | POA: Diagnosis not present

## 2016-09-26 DIAGNOSIS — K439 Ventral hernia without obstruction or gangrene: Secondary | ICD-10-CM | POA: Diagnosis not present

## 2016-10-09 ENCOUNTER — Ambulatory Visit: Payer: Self-pay | Admitting: General Surgery

## 2016-10-09 DIAGNOSIS — K460 Unspecified abdominal hernia with obstruction, without gangrene: Secondary | ICD-10-CM | POA: Diagnosis not present

## 2016-10-09 NOTE — H&P (Signed)
Rhonda [name]. Rhonda [name] 10/09/2016 9:42 AM Location: [facility] Patient #: [id number] DOB: 08-26-1961 Married / Language: Rhonda [name] / Race: White Female   History of Present Illness [name]; 10/09/2016 10:21 AM) The patient is a 55 year old female.  Note:She is referred by Dr. Coralyn Mark for consultation regarding a primary ventral hernia. She is noted some swelling in the epigastric area ever since the birth of her second son about 22 years ago. It is progressively becoming larger and she has intermittent stinging pain. She underwent a chest CT for other reasons and a ventral hernia was noted with fat coming out of the hernia. She subsequently is referred here for further evaluation and treatment. She is very active and exercises a lot.  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 Vitamin C (1000MG  Tablet, Oral) Active. Ocuvite-Lutein (Oral) Active. Bacid (Oral) Active. Medications Reconciled  Social History Rhonda [name], [name]; 10/09/2016 9:42 AM) Alcohol use Moderate alcohol use. Caffeine use Coffee, Tea. No drug use Tobacco use Never smoker.  Family History Rhonda [name], [place]; 10/09/2016 9:42 AM) Alcohol Abuse Brother, Father. Arthritis Father. Breast Cancer Family Members In General. Cancer Mother. Cerebrovascular Accident Family Members In [place], Father. Heart disease in female family member before age 79 Prostate  Cancer Father. Seizure disorder Mother. Thyroid problems Mother, Sister.  Pregnancy / Birth History Rhonda [name], [place]; 10/09/2016 9:42 AM) Age at menarche 62 years. Gravida 3 Irregular periods Length (months) of breastfeeding 7-12 Maternal age 35-30 Para 2    Review of Systems Rhonda [name] [name]; 10/09/2016 9:42 AM) General Present- Weight Loss. Not Present- Appetite Loss, Chills, Fatigue, Fever, Night Sweats and Weight Gain. Skin Not Present- Change in Wart/Mole, Dryness, Hives, Jaundice, New Lesions, Non-Healing Wounds, Rash and Ulcer. HEENT Present- Seasonal Allergies and Wears glasses/contact lenses. Not Present- Earache, Hearing Loss, Hoarseness, Nose Bleed, Oral Ulcers, Ringing in the Ears, Sinus Pain, Sore Throat, Visual Disturbances and Yellow Eyes. Respiratory Not Present- Bloody sputum, Chronic Cough, Difficulty Breathing, Snoring and Wheezing. Breast Not Present- Breast Mass, Breast Pain, Nipple Discharge and Skin Changes. Cardiovascular Not Present- Chest Pain, Difficulty Breathing Lying Down, Leg Cramps, Palpitations, Rapid Heart Rate, Shortness of Breath and Swelling of Extremities. Gastrointestinal Not Present- Abdominal Pain, Bloating, Bloody Stool, Change in Bowel Habits, Chronic diarrhea, Constipation, Difficulty Swallowing, Excessive gas, Gets full quickly at meals, Hemorrhoids, Indigestion, Nausea, Rectal Pain and Vomiting. Female Genitourinary Not Present- Frequency, Nocturia, Painful Urination, Pelvic Pain and Urgency. Musculoskeletal Not Present- Back Pain, Joint Pain, Joint Stiffness, Muscle Pain, Muscle Weakness and Swelling of Extremities. Neurological Not Present- Decreased Memory, Fainting, Headaches, Numbness, Seizures, Tingling, Tremor, Trouble walking and Weakness. Psychiatric Not Present- Anxiety, Bipolar, Change in Sleep Pattern, Depression, Fearful and Frequent crying. Endocrine Present- Hot flashes. Not Present- Cold Intolerance, Excessive Hunger,  Hair Changes, Heat Intolerance and New Diabetes. Hematology Not Present- Blood Thinners, Easy Bruising, Excessive bleeding, Gland problems, HIV and Persistent Infections.  Vitals (Armen Glenn [name]; 10/09/2016 9:44 AM) 10/09/2016 9:43 AM Weight: 164.5 lb Height: 66in Body Surface Area: 1.84 m Body Mass Index: 26.55 kg/m  Temp.: 98.71F  Pulse: 80 (Regular)  P.OX: 98% (Room air) BP: 142/82 (Sitting, Left Arm, Standard)       Physical Exam [name]; 10/09/2016 10:22 AM) The physical exam findings are as follows: Note:General: WDWN in NAD. Pleasant and cooperative.  HEENT: Rhonda [name]/AT, no external nasal or ear masses, mucous membranes are moist  EYES: EOMI, no scleral icterus, pupils normal  CV: RRR, no murmur, no edema  CHEST: Breath sounds equal and clear. Respirations nonlabored.  ABDOMEN: Soft, nontender, nondistended, epigastric bulge that is partially reducible.  MUSCULOSKELETAL: FROM, good muscle tone, venous stasis changes present, normal station and gait  SKIN: No jaundice or suspicious rashes.  NEUROLOGIC: Alert and oriented, answers questions appropriately, normal gait and station.  PSYCHIATRIC: She is anxious.    Assessment & Plan [name]; 10/09/2016 10:25 AM) Rhonda [name] VENTRAL HERNIA (K46.0) Impression: The hernia is chronically incarcerated with fatty tissue in it. CT scan was reviewed. The fascial defect does not appear large. The hernia symptomatic and she is interested in repair. She is a very active person.  Plan: I recommended open ventral hernia repair with mesh so that we could reapproximate the abdominal wall to get maximum function. I have discussed the procedure, risks, and aftercare. Risks include but are not limited to bleeding, infection, wound healing problems, anesthesia, recurrence, accidental injury to intra-abdominal organs-such as intestine, liver, spleen, bladder, etc. We also discussed the rare  complication of mesh rejection. All questions were answered. She seems to understand this and would like to proceed.  Jackolyn Confer, M.D.

## 2016-10-29 ENCOUNTER — Encounter (HOSPITAL_COMMUNITY): Payer: Self-pay

## 2016-10-29 ENCOUNTER — Encounter (HOSPITAL_COMMUNITY)
Admission: RE | Admit: 2016-10-29 | Discharge: 2016-10-29 | Disposition: A | Discharge status: Home / Self Care | Payer: BLUE CROSS/BLUE SHIELD | Source: Ambulatory Visit | Attending: General Surgery | Admitting: General Surgery

## 2016-10-29 ENCOUNTER — Encounter (INDEPENDENT_AMBULATORY_CARE_PROVIDER_SITE_OTHER): Payer: BLUE CROSS/BLUE SHIELD

## 2016-10-29 DIAGNOSIS — K436 Other and unspecified ventral hernia with obstruction, without gangrene: Secondary | ICD-10-CM | POA: Diagnosis not present

## 2016-10-29 DIAGNOSIS — Z01812 Encounter for preprocedural laboratory examination: Secondary | ICD-10-CM

## 2016-10-29 DIAGNOSIS — I868 Varicose veins of other specified sites: Secondary | ICD-10-CM

## 2016-10-29 DIAGNOSIS — Z79899 Other long term (current) drug therapy: Secondary | ICD-10-CM | POA: Diagnosis not present

## 2016-10-29 DIAGNOSIS — I739 Peripheral vascular disease, unspecified: Secondary | ICD-10-CM | POA: Diagnosis not present

## 2016-10-29 DIAGNOSIS — K439 Ventral hernia without obstruction or gangrene: Secondary | ICD-10-CM | POA: Insufficient documentation

## 2016-10-29 HISTORY — DX: Other specified postprocedural states: R11.2

## 2016-10-29 HISTORY — DX: Peripheral vascular disease, unspecified: I73.9

## 2016-10-29 HISTORY — DX: Other specified postprocedural states: Z98.890

## 2016-10-29 HISTORY — DX: Other nonspecific abnormal finding of lung field: R91.8

## 2016-10-29 LAB — CBC
HEMATOCRIT: 44.6 % (ref 36.0–46.0)
HEMOGLOBIN: 15.4 g/dL — AB (ref 12.0–15.0)
MCH: 30.9 pg (ref 26.0–34.0)
MCHC: 34.5 g/dL (ref 30.0–36.0)
MCV: 89.4 fL (ref 78.0–100.0)
Platelets: 233 10*3/uL (ref 150–400)
RBC: 4.99 MIL/uL (ref 3.87–5.11)
RDW: 12.5 % (ref 11.5–15.5)
WBC: 7.1 10*3/uL (ref 4.0–10.5)

## 2016-10-29 LAB — BASIC METABOLIC PANEL
Anion gap: 9 (ref 5–15)
BUN: 15 mg/dL (ref 6–20)
CHLORIDE: 104 mmol/L (ref 101–111)
CO2: 25 mmol/L (ref 22–32)
Calcium: 9.5 mg/dL (ref 8.9–10.3)
Creatinine, Ser: 0.76 mg/dL (ref 0.44–1.00)
GFR calc Af Amer: >60 mL/min (ref >60)
GFR calc non Af Amer: >60 mL/min (ref >60)
Glucose, Bld: 104 mg/dL — ABNORMAL HIGH (ref 65–99)
POTASSIUM: 3.9 mmol/L (ref 3.5–5.1)
SODIUM: 138 mmol/L (ref 135–145)

## 2016-10-29 LAB — HCG, SERUM, QUALITATIVE: Preg, Serum: NEGATIVE

## 2016-10-29 NOTE — Pre-Procedure Instructions (Signed)
KATHARYNE BRANTLEY  10/29/2016      [facility] [address] [place] [address] Phone: [phone] Fax: [phone]  [place], [place] [address] Phone: [phone] Fax: [phone]    Your procedure is scheduled on Friday  11/01/16  Report to [facility] Admitting at 0800 A.M.  Call this number if you have problems the morning of surgery:  [phone]   Remember:  Do not eat food or drink liquids after midnight.  Take these medicines the morning of surgery  Take with a SIP OF WATER  Lactobacillus acidophilus, Travatan     (STOP taking any Aspirin or Aspirin Products, Ibuprofen, Aleve, Advil, Motrin, Goody Powders, BCs, Herbal Medicines, Vitamins, Fish Oil)     Do not wear jewelry, make-up or nail polish.  Do not wear lotions, powders, or perfumes, or deoderant.  Do not shave 48 hours prior to surgery.  Men may shave face and neck.  Do not bring valuables to the hospital.  [facility] is not responsible for any belongings or valuables.  Contacts, dentures or bridgework may not be worn into surgery.  Leave your suitcase in the car.  After surgery it may be brought to your room.  For patients admitted to the hospital, discharge time will be determined by your treatment team.  Patients discharged the day of surgery will not be allowed to drive home.   Name and phone number of your driver:     Special instructions:  [facility] - Preparing for Surgery  Before surgery, you can play an important role.  Because skin is not sterile, your skin needs to be as free of germs as possible.  You can reduce the number of germs on you skin by washing with CHG (chlorahexidine gluconate) soap before surgery.  CHG is an antiseptic cleaner which kills germs and bonds with the skin to continue killing germs even after  washing.  Please DO NOT use if you have an allergy to CHG or antibacterial soaps.  If your skin becomes reddened/irritated stop using the CHG and inform your nurse when you arrive at Short Stay.  Do not shave (including legs and underarms) for at least 48 hours prior to the first CHG shower.  You may shave your face.  Please follow these instructions carefully:   1.  Shower with CHG Soap the night before surgery and the                                morning of Surgery.  2.  If you choose to wash your hair, wash your hair first as usual with your       normal shampoo.  3.  After you shampoo, rinse your hair and body thoroughly to remove the                      Shampoo.  4.  Use CHG as you would any other liquid soap.  You can apply chg directly       to the skin and wash gently with scrungie or a clean washcloth.  5.  Apply the CHG Soap to your body ONLY FROM THE NECK DOWN.        Do not use on open wounds or open sores.  Avoid contact with your eyes,       ears, mouth and genitals (private parts).  Wash genitals (private parts)       with your normal soap.  6.  Wash thoroughly, paying special attention to the area where your surgery        will be performed.  7.  Thoroughly rinse your body with warm water from the neck down.  8.  DO NOT shower/wash with your normal soap after using and rinsing off       the CHG Soap.  9.  Pat yourself dry with a clean towel.            10.  Wear clean pajamas.            11.  Place clean sheets on your bed the night of your first shower and do not        sleep with pets.  Day of Surgery  Do not apply any lotions/deoderants the morning of surgery.  Please wear clean clothes to the hospital/surgery center.    Please read over the following fact sheets that you were given. Pain Booklet

## 2016-11-01 ENCOUNTER — Ambulatory Visit (HOSPITAL_COMMUNITY): Payer: BLUE CROSS/BLUE SHIELD | Admitting: Anesthesiology

## 2016-11-01 ENCOUNTER — Encounter (HOSPITAL_COMMUNITY)
Admission: RE | Disposition: A | Discharge status: Home / Self Care | Payer: Self-pay | Source: Ambulatory Visit | Attending: General Surgery

## 2016-11-01 ENCOUNTER — Encounter (HOSPITAL_COMMUNITY): Payer: Self-pay | Admitting: Anesthesiology

## 2016-11-01 ENCOUNTER — Ambulatory Visit (HOSPITAL_COMMUNITY)
Admission: RE | Admit: 2016-11-01 | Discharge: 2016-11-01 | Disposition: A | Discharge status: Home / Self Care | Payer: BLUE CROSS/BLUE SHIELD | Source: Ambulatory Visit | Attending: General Surgery | Admitting: General Surgery

## 2016-11-01 DIAGNOSIS — Z79899 Other long term (current) drug therapy: Secondary | ICD-10-CM | POA: Diagnosis not present

## 2016-11-01 DIAGNOSIS — K439 Ventral hernia without obstruction or gangrene: Secondary | ICD-10-CM | POA: Diagnosis not present

## 2016-11-01 DIAGNOSIS — F419 Anxiety disorder, unspecified: Secondary | ICD-10-CM | POA: Diagnosis not present

## 2016-11-01 DIAGNOSIS — I739 Peripheral vascular disease, unspecified: Secondary | ICD-10-CM | POA: Insufficient documentation

## 2016-11-01 DIAGNOSIS — K436 Other and unspecified ventral hernia with obstruction, without gangrene: Secondary | ICD-10-CM | POA: Diagnosis not present

## 2016-11-01 HISTORY — PX: VENTRAL HERNIA REPAIR: SHX424

## 2016-11-01 HISTORY — PX: INSERTION OF MESH: SHX5868

## 2016-11-01 SURGERY — REPAIR, HERNIA, VENTRAL
Anesthesia: General | Site: Abdomen

## 2016-11-01 MED ORDER — DEXAMETHASONE SODIUM PHOSPHATE 10 MG/ML IJ SOLN
INTRAMUSCULAR | Status: DC | PRN
  Administered 2016-11-01: 10 mg via INTRAVENOUS

## 2016-11-01 MED ORDER — LACTATED RINGERS IV SOLN
INTRAVENOUS | Status: DC
  Administered 2016-11-01 (×3): via INTRAVENOUS

## 2016-11-01 MED ORDER — CEFAZOLIN SODIUM-DEXTROSE 2-4 GM/100ML-% IV SOLN
2.0000 g | INTRAVENOUS | Status: AC
  Administered 2016-11-01: 2 g via INTRAVENOUS
  Filled 2016-11-01: qty 100

## 2016-11-01 MED ORDER — HYDROMORPHONE HCL 2 MG/ML IJ SOLN
INTRAMUSCULAR | Status: AC
  Filled 2016-11-01: qty 1

## 2016-11-01 MED ORDER — MIDAZOLAM HCL 5 MG/5ML IJ SOLN
INTRAMUSCULAR | Status: DC | PRN
  Administered 2016-11-01 (×2): 1 mg via INTRAVENOUS

## 2016-11-01 MED ORDER — SCOPOLAMINE 1 MG/3DAYS TD PT72
MEDICATED_PATCH | TRANSDERMAL | Status: DC | PRN
  Administered 2016-11-01: 1 via TRANSDERMAL

## 2016-11-01 MED ORDER — MIDAZOLAM HCL 2 MG/2ML IJ SOLN
INTRAMUSCULAR | Status: AC
  Filled 2016-11-01: qty 2

## 2016-11-01 MED ORDER — SCOPOLAMINE 1 MG/3DAYS TD PT72
MEDICATED_PATCH | TRANSDERMAL | Status: AC
  Filled 2016-11-01: qty 1

## 2016-11-01 MED ORDER — SUGAMMADEX SODIUM 200 MG/2ML IV SOLN
INTRAVENOUS | Status: DC | PRN
  Administered 2016-11-01: 150 mg via INTRAVENOUS

## 2016-11-01 MED ORDER — ONDANSETRON HCL 4 MG PO TABS: 4.0000 mg | ORAL_TABLET | ORAL | 0 refills | Status: DC | PRN

## 2016-11-01 MED ORDER — OXYCODONE HCL 5 MG PO TABS: 5.0000 mg | ORAL_TABLET | ORAL | 0 refills | Status: DC | PRN

## 2016-11-01 MED ORDER — BUPIVACAINE-EPINEPHRINE 0.5% -1:200000 IJ SOLN: INTRAMUSCULAR | Status: DC | PRN

## 2016-11-01 MED ORDER — ROCURONIUM BROMIDE 10 MG/ML (PF) SYRINGE
PREFILLED_SYRINGE | INTRAVENOUS | Status: AC
  Filled 2016-11-01: qty 10

## 2016-11-01 MED ORDER — PROPOFOL 10 MG/ML IV BOLUS
INTRAVENOUS | Status: DC | PRN
  Administered 2016-11-01: 150 mg via INTRAVENOUS

## 2016-11-01 MED ORDER — LIDOCAINE 2% (20 MG/ML) 5 ML SYRINGE
INTRAMUSCULAR | Status: AC
  Filled 2016-11-01: qty 5

## 2016-11-01 MED ORDER — DEXAMETHASONE SODIUM PHOSPHATE 10 MG/ML IJ SOLN
INTRAMUSCULAR | Status: AC
  Filled 2016-11-01: qty 1

## 2016-11-01 MED ORDER — BUPIVACAINE HCL 0.25 % IJ SOLN
INTRAMUSCULAR | Status: DC | PRN
  Administered 2016-11-01: 4 mL

## 2016-11-01 MED ORDER — ROCURONIUM BROMIDE 100 MG/10ML IV SOLN
INTRAVENOUS | Status: DC | PRN
  Administered 2016-11-01: 50 mg via INTRAVENOUS

## 2016-11-01 MED ORDER — FENTANYL CITRATE (PF) 100 MCG/2ML IJ SOLN
INTRAMUSCULAR | Status: AC
  Filled 2016-11-01: qty 2

## 2016-11-01 MED ORDER — ACETAMINOPHEN 10 MG/ML IV SOLN
INTRAVENOUS | Status: AC
  Filled 2016-11-01: qty 100

## 2016-11-01 MED ORDER — PROPOFOL 10 MG/ML IV BOLUS
INTRAVENOUS | Status: AC
  Filled 2016-11-01: qty 20

## 2016-11-01 MED ORDER — DIPHENHYDRAMINE HCL 50 MG/ML IJ SOLN
INTRAMUSCULAR | Status: DC | PRN
  Administered 2016-11-01: 25 mg via INTRAVENOUS

## 2016-11-01 MED ORDER — ONDANSETRON HCL 4 MG/2ML IJ SOLN
INTRAMUSCULAR | Status: AC
  Filled 2016-11-01: qty 2

## 2016-11-01 MED ORDER — FENTANYL CITRATE (PF) 100 MCG/2ML IJ SOLN
INTRAMUSCULAR | Status: DC | PRN
  Administered 2016-11-01: 100 ug via INTRAVENOUS
  Administered 2016-11-01: 50 ug via INTRAVENOUS
  Administered 2016-11-01: 100 ug via INTRAVENOUS
  Administered 2016-11-01: 50 ug via INTRAVENOUS

## 2016-11-01 MED ORDER — CHLORHEXIDINE GLUCONATE CLOTH 2 % EX PADS: 6.0000 | MEDICATED_PAD | Freq: Once | CUTANEOUS | Status: DC

## 2016-11-01 MED ORDER — SUGAMMADEX SODIUM 200 MG/2ML IV SOLN
INTRAVENOUS | Status: AC
  Filled 2016-11-01: qty 2

## 2016-11-01 MED ORDER — BUPIVACAINE HCL (PF) 0.5 % IJ SOLN
INTRAMUSCULAR | Status: AC
  Filled 2016-11-01: qty 30

## 2016-11-01 MED ORDER — BUPIVACAINE LIPOSOME 1.3 % IJ SUSP
20.0000 mL | INTRAMUSCULAR | Status: AC
  Administered 2016-11-01: 19 mL
  Filled 2016-11-01: qty 20

## 2016-11-01 MED ORDER — ONDANSETRON HCL 4 MG/2ML IJ SOLN
4.0000 mg | Freq: Four times a day (QID) | INTRAMUSCULAR | Status: AC | PRN
  Administered 2016-11-01: 4 mg via INTRAVENOUS

## 2016-11-01 MED ORDER — HYDROMORPHONE HCL 1 MG/ML IJ SOLN
0.2500 mg | INTRAMUSCULAR | Status: DC | PRN
  Administered 2016-11-01 (×2): 0.5 mg via INTRAVENOUS

## 2016-11-01 MED ORDER — ACETAMINOPHEN 10 MG/ML IV SOLN
INTRAVENOUS | Status: DC | PRN
  Administered 2016-11-01: 1000 mg via INTRAVENOUS

## 2016-11-01 MED ORDER — LIDOCAINE HCL (CARDIAC) 20 MG/ML IV SOLN
INTRAVENOUS | Status: DC | PRN
  Administered 2016-11-01: 80 mg via INTRAVENOUS

## 2016-11-01 MED ORDER — OXYCODONE HCL 5 MG PO TABS: 5.0000 mg | ORAL_TABLET | Freq: Once | ORAL | Status: DC | PRN

## 2016-11-01 MED ORDER — OXYCODONE HCL 5 MG/5ML PO SOLN: 5.0000 mg | Freq: Once | ORAL | Status: DC | PRN

## 2016-11-01 MED ORDER — DIPHENHYDRAMINE HCL 50 MG/ML IJ SOLN
INTRAMUSCULAR | Status: AC
  Filled 2016-11-01: qty 1

## 2016-11-01 MED ORDER — FENTANYL CITRATE (PF) 100 MCG/2ML IJ SOLN
INTRAMUSCULAR | Status: AC
  Filled 2016-11-01: qty 4

## 2016-11-01 SURGICAL SUPPLY — 43 items
APL SKNCLS STERI-STRIP NONHPOA (GAUZE/BANDAGES/DRESSINGS) ×2
BENZOIN TINCTURE PRP APPL 2/3 (GAUZE/BANDAGES/DRESSINGS) ×4 IMPLANT
BLADE SURG ROTATE 9660 (MISCELLANEOUS) IMPLANT
CLOSURE WOUND 1/2 X4 (GAUZE/BANDAGES/DRESSINGS) ×1
COVER SURGICAL LIGHT HANDLE (MISCELLANEOUS) ×4 IMPLANT
DRAPE INCISE IOBAN 66X45 STRL (DRAPES) ×4 IMPLANT
DRAPE LAPAROSCOPIC ABDOMINAL (DRAPES) ×4 IMPLANT
DRAPE UTILITY XL STRL (DRAPES) ×4 IMPLANT
DRESSING TELFA 8X3 (GAUZE/BANDAGES/DRESSINGS) ×4 IMPLANT
DRSG TEGADERM 4X4.75 (GAUZE/BANDAGES/DRESSINGS) ×4 IMPLANT
ELECT CAUTERY BLADE 6.4 (BLADE) ×4 IMPLANT
ELECT REM PT RETURN 9FT ADLT (ELECTROSURGICAL) ×4
ELECTRODE REM PT RTRN 9FT ADLT (ELECTROSURGICAL) ×2 IMPLANT
GAUZE SPONGE 4X4 12PLY STRL (GAUZE/BANDAGES/DRESSINGS) ×4 IMPLANT
GLOVE BIOGEL PI IND STRL 8 (GLOVE) ×2 IMPLANT
GLOVE BIOGEL PI INDICATOR 8 (GLOVE) ×2
GLOVE ECLIPSE 8.0 STRL XLNG CF (GLOVE) ×4 IMPLANT
GOWN STRL REUS W/ TWL LRG LVL3 (GOWN DISPOSABLE) ×6 IMPLANT
GOWN STRL REUS W/TWL LRG LVL3 (GOWN DISPOSABLE) ×9
KIT BASIN OR (CUSTOM PROCEDURE TRAY) ×4 IMPLANT
KIT ROOM TURNOVER OR (KITS) ×4 IMPLANT
MESH HERNIA 3X6 (Mesh General) ×4 IMPLANT
NEEDLE HYPO 25GX1X1/2 BEV (NEEDLE) ×4 IMPLANT
NS IRRIG 1000ML POUR BTL (IV SOLUTION) ×4 IMPLANT
PACK GENERAL/GYN (CUSTOM PROCEDURE TRAY) ×4 IMPLANT
PAD ARMBOARD 7.5X6 YLW CONV (MISCELLANEOUS) ×8 IMPLANT
SPONGE GAUZE 4X4 12PLY STER LF (GAUZE/BANDAGES/DRESSINGS) ×4 IMPLANT
STAPLER VISISTAT 35W (STAPLE) IMPLANT
STRIP CLOSURE SKIN 1/2X4 (GAUZE/BANDAGES/DRESSINGS) ×3 IMPLANT
SUT MNCRL AB 3-0 PS2 18 (SUTURE) ×4 IMPLANT
SUT MNCRL AB 4-0 PS2 18 (SUTURE) ×4 IMPLANT
SUT NOVA NAB GS-21 0 18 T12 DT (SUTURE) ×8 IMPLANT
SUT PDS AB 0 CT 36 (SUTURE) IMPLANT
SUT PROLENE 0 CT 1 30 (SUTURE) ×4 IMPLANT
SUT VIC AB 2-0 SH 27 (SUTURE) ×4
SUT VIC AB 2-0 SH 27X BRD (SUTURE) ×2 IMPLANT
SUT VIC AB 3-0 SH 27 (SUTURE) ×4
SUT VIC AB 3-0 SH 27X BRD (SUTURE) ×2 IMPLANT
SUT VICRYL AB 2 0 TIES (SUTURE) IMPLANT
SYR CONTROL 10ML LL (SYRINGE) ×4 IMPLANT
TOWEL OR 17X24 6PK STRL BLUE (TOWEL DISPOSABLE) ×4 IMPLANT
TOWEL OR 17X26 10 PK STRL BLUE (TOWEL DISPOSABLE) ×4 IMPLANT
WATER STERILE IRR 1000ML POUR (IV SOLUTION) IMPLANT

## 2016-11-01 NOTE — Interval H&P Note (Signed)
History and Physical Interval Note:  11/01/2016 9:10 AM  Rhonda [name]  has presented today for surgery, with the diagnosis of PRIMARY VENTRAL HERNIA  The various methods of treatment have been discussed with the patient and family. After consideration of risks, benefits and other options for treatment, the patient has consented to  Procedure(s): VENTRAL HERNIA REPAIR WITH MESH (N/A) INSERTION OF MESH (N/A) as a surgical intervention .  The patient's history has been reviewed, patient examined, no change in status, stable for surgery.  I have reviewed the patient's chart and labs.  Questions were answered to the patient's satisfaction.     Keefer Soulliere Lenna Sciara

## 2016-11-01 NOTE — Anesthesia Postprocedure Evaluation (Signed)
Anesthesia Post Note  Patient: Rhonda [name]  Procedure(s) Performed: Procedure(s) (LRB): VENTRAL HERNIA REPAIR WITH MESH (N/A) INSERTION OF MESH (N/A)  Patient location during evaluation: PACU Anesthesia Type: General Level of consciousness: awake and alert and patient cooperative Pain management: pain level controlled Vital Signs Assessment: post-procedure vital signs reviewed and stable Respiratory status: spontaneous breathing and respiratory function stable Cardiovascular status: stable Anesthetic complications: no    Last Vitals:  Vitals:   11/01/16 1130 11/01/16 1145  BP: 110/69 117/66  Pulse: 62 62  Resp: 17 17  Temp:      Last Pain:  Vitals:   11/01/16 1115  PainSc: [place]

## 2016-11-01 NOTE — H&P (View-Only) (Signed)
Rhonda [name]. Rhonda [name] 10/09/2016 9:42 AM Location: [facility] Patient #: [id number] DOB: 01-14-1961 Married / Language: Rhonda [name] / Race: White Female   History of Present Illness Rhonda Hollingshead MD; 10/09/2016 10:21 AM) The patient is a 55 year old female.  Note:She is referred by Dr. Coralyn Mark for consultation regarding a primary ventral hernia. She is noted some swelling in the epigastric area ever since the birth of her second son about 22 years ago. It is progressively becoming larger and she has intermittent stinging pain. She underwent a chest CT for other reasons and a ventral hernia was noted with fat coming out of the hernia. She subsequently is referred here for further evaluation and treatment. She is very active and exercises a lot.  Other Problems Rhonda [name], [place]; 10/09/2016 9:42 AM) Anxiety Disorder Bladder Problems Hypercholesterolemia Lump In Breast Vascular Disease  Past Surgical History Rhonda [name], Rhonda [name]; 10/09/2016 9:42 AM) Tonsillectomy  Diagnostic Studies History Rhonda [name], Rhonda [name]; 10/09/2016 9:42 AM) Colonoscopy never Mammogram within last year Pap Smear 1-5 years ago  Allergies Rhonda [name], Rhonda [name]; 10/09/2016 9:44 AM) Diflucan *ANTIFUNGALS*  Medication History Rhonda [name], Rhonda [name]; 10/09/2016 9:45 AM) Travatan Z (0.004% Solution, Ophthalmic) Active. Vitamin C (1000MG  Tablet, Oral) Active. Ocuvite-Lutein (Oral) Active. Bacid (Oral) Active. Medications Reconciled  Social History Rhonda [name], Rhonda [name]; 10/09/2016 9:42 AM) Alcohol use Moderate alcohol use. Caffeine use Coffee, Tea. No drug use Tobacco use Never smoker.  Family History Rhonda [name], [name]; 10/09/2016 9:42 AM) Alcohol Abuse Brother, Father. Arthritis Father. Breast Cancer Family Members In General. Cancer Mother. Cerebrovascular Accident Family Members In [place], Father. Heart disease in female family member before age 5 Prostate  Cancer Father. Seizure disorder Mother. Thyroid problems Mother, Sister.  Pregnancy / Birth History Rhonda [name], Rhonda [name]; 10/09/2016 9:42 AM) Age at menarche 56 years. Gravida 3 Irregular periods Length (months) of breastfeeding 7-12 Maternal age 67-30 Para 2    Review of Systems Rhonda [name] Rhonda [name]; 10/09/2016 9:42 AM) General Present- Weight Loss. Not Present- Appetite Loss, Chills, Fatigue, Fever, Night Sweats and Weight Gain. Skin Not Present- Change in Wart/Mole, Dryness, Hives, Jaundice, New Lesions, Non-Healing Wounds, Rash and Ulcer. HEENT Present- Seasonal Allergies and Wears glasses/contact lenses. Not Present- Earache, Hearing Loss, Hoarseness, Nose Bleed, Oral Ulcers, Ringing in the Ears, Sinus Pain, Sore Throat, Visual Disturbances and Yellow Eyes. Respiratory Not Present- Bloody sputum, Chronic Cough, Difficulty Breathing, Snoring and Wheezing. Breast Not Present- Breast Mass, Breast Pain, Nipple Discharge and Skin Changes. Cardiovascular Not Present- Chest Pain, Difficulty Breathing Lying Down, Leg Cramps, Palpitations, Rapid Heart Rate, Shortness of Breath and Swelling of Extremities. Gastrointestinal Not Present- Abdominal Pain, Bloating, Bloody Stool, Change in Bowel Habits, Chronic diarrhea, Constipation, Difficulty Swallowing, Excessive gas, Gets full quickly at meals, Hemorrhoids, Indigestion, Nausea, Rectal Pain and Vomiting. Female Genitourinary Not Present- Frequency, Nocturia, Painful Urination, Pelvic Pain and Urgency. Musculoskeletal Not Present- Back Pain, Joint Pain, Joint Stiffness, Muscle Pain, Muscle Weakness and Swelling of Extremities. Neurological Not Present- Decreased Memory, Fainting, Headaches, Numbness, Seizures, Tingling, Tremor, Trouble walking and Weakness. Psychiatric Not Present- Anxiety, Bipolar, Change in Sleep Pattern, Depression, Fearful and Frequent crying. Endocrine Present- Hot flashes. Not Present- Cold Intolerance, Excessive Hunger,  Hair Changes, Heat Intolerance and New Diabetes. Hematology Not Present- Blood Thinners, Easy Bruising, Excessive bleeding, Gland problems, HIV and Persistent Infections.  Vitals (Armen Glenn Rhonda [name]; 10/09/2016 9:44 AM) 10/09/2016 9:43 AM Weight: 164.5 lb Height: 66in Body Surface Area: 1.84 m Body Mass Index: 26.55 kg/m  Temp.: 98.32F  Pulse: 80 (Regular)  P.OX: 98% (Room air) BP: 142/82 (Sitting, Left Arm, Standard)       Physical Exam Rhonda Hollingshead MD; 10/09/2016 10:22 AM) The physical exam findings are as follows: Note:General: WDWN in NAD. Pleasant and cooperative.  HEENT: /AT, no external nasal or ear masses, mucous membranes are moist  EYES: EOMI, no scleral icterus, pupils normal  CV: RRR, no murmur, no edema  CHEST: Breath sounds equal and clear. Respirations nonlabored.  ABDOMEN: Soft, nontender, nondistended, epigastric bulge that is partially reducible.  MUSCULOSKELETAL: FROM, good muscle tone, venous stasis changes present, normal station and gait  SKIN: No jaundice or suspicious rashes.  NEUROLOGIC: Alert and oriented, answers questions appropriately, normal gait and station.  PSYCHIATRIC: She is anxious.    Assessment & Plan Rhonda Hollingshead MD; 10/09/2016 10:25 AM) Rhonda [name] VENTRAL HERNIA (K46.0) Impression: The hernia is chronically incarcerated with fatty tissue in it. CT scan was reviewed. The fascial defect does not appear large. The hernia symptomatic and she is interested in repair. She is a very active person.  Plan: I recommended open ventral hernia repair with mesh so that we could reapproximate the abdominal wall to get maximum function. I have discussed the procedure, risks, and aftercare. Risks include but are not limited to bleeding, infection, wound healing problems, anesthesia, recurrence, accidental injury to intra-abdominal organs-such as intestine, liver, spleen, bladder, etc. We also discussed the rare  complication of mesh rejection. All questions were answered. She seems to understand this and would like to proceed.  Jackolyn Confer, M.D.

## 2016-11-01 NOTE — Anesthesia Procedure Notes (Signed)
Procedure Name: Intubation Date/Time: 11/01/2016 9:32 AM Performed by: Rebekah Chesterfield L Pre-anesthesia Checklist: Patient identified, Emergency Drugs available, Suction available and Patient being monitored Patient Re-evaluated:Patient Re-evaluated prior to inductionOxygen Delivery Method: Circle System Utilized Preoxygenation: Pre-oxygenation with 100% oxygen Intubation Type: IV induction Ventilation: Mask ventilation without difficulty Laryngoscope Size: Mac and 3 Grade View: Grade I Tube type: Oral Tube size: 7.0 mm Number of attempts: 1 Airway Equipment and Method: Stylet Placement Confirmation: ETT inserted through vocal cords under direct vision,  positive ETCO2 and breath sounds checked- equal and bilateral Secured at: 21 cm Tube secured with: Tape Dental Injury: Teeth and Oropharynx as per pre-operative assessment

## 2016-11-01 NOTE — Op Note (Signed)
Operative Note  Rhonda [name] female 55 y.o. 11/01/2016  PREOPERATIVE DX: Primary ventral hernia  POSTOPERATIVE DX:  Same  PROCEDURE:   Repair of ventral hernia with mesh         Surgeon: Odis Hollingshead   Assistants: None  Anesthesia: General endotracheal anesthesia  Indications:   This is a 55 year old female who is noted some swelling in her epigastric area for many years. She states she has some stinging pain from it and it has become progressively larger. She underwent a CT scan of her chest for the reasons and a ventral hernia was noted in the epigastrium with fatty tissue protruding through the hernia.  She now presents for repair.   Procedure Detail:  She was brought to the operating room placed supine on the operating table and a general anesthetic was administered. The abdominal wall was widely sterilely prepped and draped.  The area of the hernia was identified in the epigastrium. A limited longitudinal epigastric midline incision was made through the skin and subcutaneous tissue. Hernia contents were identified and dissected free from the subcutaneous tissues of the abdominal wall. Hernia contents measured proximally 6 cm x 6 cm and appeared to be fatty tissue. The hernia defect was not nearly this large. Using a LigaSure device, I resected the hernia contents and sent it to pathology.  Next, subcutaneous tissue was dissected free from the fascia and approximate 4 cm all around the hernia defect. The hernia defect was then repaired with interrupted #1 Novafil sutures left long in a transverse fashion. A piece of 3 x 6" polypropylene mesh was brought into the field and cut so that there would be 3-4 cm of overlap in all direction. The primary repair sutures were threaded up through the mesh and tied down anchoring the central portion the mesh directly over the primary repair. The periphery of the mesh was then anchored to the surrounding fascia with running 0 Prolene suture.  Excess mesh was trimmed and removed. Exparel was then injected into the fascia and subcutaneous tissues. Hemostasis was adequate.  The subcutaneous tissue was then closed over the mesh with running 2-0 Vicryl suture. The skin was closed with a running 3-0 Monocryl subcuticular stitch. Steri-Strips and a sterile dressing were applied.  She tolerated the procedure well without any apparent complications and was taken to the recovery room in satisfactory condition.  Estimated Blood Loss:  less than 100 mL         Specimens: Hernia contents        Complications:  * No complications entered in OR log *         Disposition: PACU - hemodynamically stable.         Condition: stable

## 2016-11-01 NOTE — Transfer of Care (Signed)
Immediate Anesthesia Transfer of Care Note  Patient: Rhonda [name]  Procedure(s) Performed: Procedure(s): VENTRAL HERNIA REPAIR WITH MESH (N/A) INSERTION OF MESH (N/A)  Patient Location: PACU  Anesthesia Type:General  Level of Consciousness: awake, alert , oriented and patient cooperative  Airway & Oxygen Therapy: Patient Spontanous Breathing and Patient connected to nasal cannula oxygen  Post-op Assessment: Report given to RN, Post -op Vital signs reviewed and stable and Patient moving all extremities  Post vital signs: Reviewed and stable  Last Vitals:  Vitals:   11/01/16 1043 11/01/16 1046  BP:    Pulse: 95 94  Resp: 13 (!) 21  Temp: 36.7 C     Last Pain: There were no vitals filed for this visit.    Patients Stated Pain Goal: 6 ([phone])  Complications: No apparent anesthesia complications

## 2016-11-01 NOTE — Discharge Instructions (Signed)
[facility] _______[facility]   HERNIA REPAIR: POST OP INSTRUCTIONS  Always review your discharge instruction sheet given to you by the facility where your surgery was performed. IF YOU HAVE DISABILITY OR FAMILY LEAVE FORMS, YOU MUST BRING THEM TO THE OFFICE FOR PROCESSING.   DO NOT GIVE THEM TO YOUR DOCTOR.  1. A  prescription for pain medication may be given to you upon discharge.  Take your pain medication as prescribed, if needed.  If narcotic pain medicine is not needed, then you may take acetaminophen (Tylenol) or ibuprofen (Advil) as needed. 2. Take your usually prescribed medications unless otherwise directed. 3. If you need a refill on your pain medication, please contact your pharmacy.  They will contact our office to request authorization. Prescriptions will not be filled after 5 pm or on week-ends. 4. You should follow a light diet the first 24 hours after arrival home, such as soup and crackers, etc.  Be sure to include lots of fluids daily.  Resume your normal diet the day after surgery. 5. Most patients will experience some swelling and bruising around the hernia repair site.  Ice packs and reclining will help.  Swelling and bruising can take several days to resolve.  6. It is common to experience some constipation if taking pain medication after surgery.  Increasing fluid intake and taking a stool softener (such as Colace) will usually help or prevent this problem from occurring.  A mild laxative (Milk of Magnesia or Miralax) should be taken according to package directions if there are no bowel movements after 48 hours. 7. Unless discharge instructions indicate otherwise, you may remove your bandages 72 hours after surgery.  You may shower the day after surgery.  You may have steri-strips (small skin tapes) in place directly over the incision.  These strips should be left on the skin until they fall off.  If your surgeon used skin glue on the incision, you may shower in 24  hours.  The glue will flake off over the next 2-3 weeks.  Any sutures or staples will be removed at the office during your follow-up visit. 8. ACTIVITIES:  Do not lie flat for 3-5 days. You may resume light daily activities beginning the next day--such as daily self-care, walking, climbing stairs--gradually increasing activities as tolerated. Do not lie flat for the first 2-3 days. You may have sexual intercourse when it is comfortable.  Refrain from any heavy lifting or straining-nothing over 10 pounds for 6 weeks.   a. You may drive when you are no longer taking prescription pain medication, you can comfortably wear a seatbelt, and you can safely maneuver your car and apply brakes. b. RETURN TO WORK:  _Desk type work in 1-2 weeks if pain-free, full duty in 6 weeks._________________________________________________________ 9. You should see your doctor in the office for a follow-up appointment approximately 2-3 weeks after your surgery.  Make sure that you call for this appointment within a day or two after you arrive home to insure a convenient appointment time. 10. OTHER INSTRUCTIONS:  __________________________________________________________________________________________________________________________________________________________________________________________  WHEN TO CALL YOUR DOCTOR: 1. Fever over 101.0 2. Inability to urinate 3. Nausea and/or vomiting 4. Extreme swelling or bruising 5. Continued bleeding from incision. 6. Increased pain, redness, or drainage from the incision  The clinic staff is available to answer your questions during regular business hours.  Please dont hesitate to call and ask to speak to one of the nurses for clinical concerns.  If you have a medical emergency,  go to the nearest emergency room or call 911.  A surgeon from [facility] is always on call at the hospital   [address], [place], [place], [place]  [address] ?  [address],  [place], [place]   [address] [phone] ? [phone] ? FAX [phone] Web site: [email or web address]

## 2016-11-01 NOTE — Anesthesia Preprocedure Evaluation (Signed)
Anesthesia Evaluation  Patient identified by MRN, date of birth, ID band Patient awake    Reviewed: Allergy & Precautions, H&P , NPO status , Patient's Chart, lab work & pertinent test results  History of Anesthesia Complications (+) PONV  Airway Mallampati: II   Neck ROM: full    Dental   Pulmonary neg pulmonary ROS,    breath sounds clear to auscultation       Cardiovascular + Peripheral Vascular Disease   Rhythm:regular Rate:Normal     Neuro/Psych Anxiety    GI/Hepatic   Endo/Other    Renal/GU      Musculoskeletal   Abdominal   Peds  Hematology   Anesthesia Other Findings   Reproductive/Obstetrics                             Anesthesia Physical Anesthesia Plan  ASA: II  Anesthesia Plan: General   Post-op Pain Management:    Induction: Intravenous  Airway Management Planned: Oral ETT  Additional Equipment:   Intra-op Plan:   Post-operative Plan: Extubation in OR  Informed Consent: I have reviewed the patients History and Physical, chart, labs and discussed the procedure including the risks, benefits and alternatives for the proposed anesthesia with the patient or authorized representative who has indicated his/her understanding and acceptance.     Plan Discussed with: CRNA, Anesthesiologist and Surgeon  Anesthesia Plan Comments:         Anesthesia Quick Evaluation

## 2016-11-04 ENCOUNTER — Encounter (HOSPITAL_COMMUNITY): Payer: Self-pay | Admitting: General Surgery

## 2016-11-15 ENCOUNTER — Encounter (HOSPITAL_COMMUNITY): Payer: Self-pay | Admitting: General Surgery

## 2016-11-29 DIAGNOSIS — J40 Bronchitis, not specified as acute or chronic: Secondary | ICD-10-CM | POA: Diagnosis not present

## 2016-12-13 ENCOUNTER — Other Ambulatory Visit: Payer: Self-pay | Admitting: Obstetrics and Gynecology

## 2016-12-13 DIAGNOSIS — Z1231 Encounter for screening mammogram for malignant neoplasm of breast: Secondary | ICD-10-CM

## 2016-12-16 HISTORY — PX: COLONOSCOPY: SHX174

## 2017-01-14 DIAGNOSIS — R911 Solitary pulmonary nodule: Secondary | ICD-10-CM | POA: Diagnosis not present

## 2017-01-14 DIAGNOSIS — Z23 Encounter for immunization: Secondary | ICD-10-CM | POA: Diagnosis not present

## 2017-01-14 DIAGNOSIS — K458 Other specified abdominal hernia without obstruction or gangrene: Secondary | ICD-10-CM | POA: Diagnosis not present

## 2017-01-14 DIAGNOSIS — Z0001 Encounter for general adult medical examination with abnormal findings: Secondary | ICD-10-CM | POA: Diagnosis not present

## 2017-01-14 DIAGNOSIS — E7800 Pure hypercholesterolemia, unspecified: Secondary | ICD-10-CM | POA: Diagnosis not present

## 2017-01-14 DIAGNOSIS — F419 Anxiety disorder, unspecified: Secondary | ICD-10-CM | POA: Diagnosis not present

## 2017-01-17 ENCOUNTER — Encounter (INDEPENDENT_AMBULATORY_CARE_PROVIDER_SITE_OTHER): Payer: BLUE CROSS/BLUE SHIELD

## 2017-02-10 ENCOUNTER — Ambulatory Visit
Admission: RE | Admit: 2017-02-10 | Discharge: 2017-02-10 | Disposition: A | Discharge status: Home / Self Care | Payer: BLUE CROSS/BLUE SHIELD | Source: Ambulatory Visit | Attending: Obstetrics and Gynecology | Admitting: Obstetrics and Gynecology

## 2017-02-10 DIAGNOSIS — Z1231 Encounter for screening mammogram for malignant neoplasm of breast: Secondary | ICD-10-CM | POA: Diagnosis not present

## 2017-03-05 DIAGNOSIS — H401131 Primary open-angle glaucoma, bilateral, mild stage: Secondary | ICD-10-CM | POA: Diagnosis not present

## 2017-03-10 NOTE — Progress Notes (Signed)
56 y.o. G47P2012 Married Caucasian female here for annual exam.    No menses for 11 months.  Vasomotor symptoms improving.   Good bladder control.   Had incarcerated bowel in ventral hernia and surgery done November 2017 by Dr. Barkley Bruns.  Presenting symptoms was pain after exercise. Has the lump in her abdominal wall for years.  Is back to increasing her exercise. Lost over 20 pounds.   Will do labs with PCP. Patient will also have a follow up CT of the chest to recheck nodules.  PCP:   Dr. Bing Ree [facility].   Patient's last menstrual period was 04/05/2016.           Sexually active: No.  The current method of family planning is none and patient has not been sexually active since hernia surgery in 10/2016.    Exercising: Yes.    fitness walk/treadmill Smoker:  no  Health Maintenance: Pap:02-13-15 Neg:Neg HR HPV;2014 Neg:Neg HR HPV  History of abnormal Pap:  Yes, ASCUS 2014 MMG:02-10-17 Density C/Neg/BiRads1:TBC   Colonoscopy:  never BMD: 10-17-03  Result: Osteopenia of hip and spine.  States she had a follow up BMD done which was normal.  We do not have a record of that.   TDaP: January 2018 Gardasil:   N/A HIV: 2017 w/ PCP Hep C: April 2017 w/ PCP Screening Labs:  Hb today: PCP takes care of labs   reports that she has never smoked. She has never used smokeless tobacco. She reports that she drinks about 1.8 - 2.4 oz of alcohol per week . She reports that she does not use drugs.  Past Medical History:  Diagnosis Date  . Allergy    seasonal  . Anxiety   . Fibroid   . Glaucoma   . Leg pain, bilateral   . Lung nodules    benign  . Other and unspecified hyperlipidemia 04/05/2013  . Peripheral vascular disease (HCC)    chronic venous insufficiency bilaterally  . PONV (postoperative nausea and vomiting)     Past Surgical History:  Procedure Laterality Date  . BLADDER SUSPENSION  2010   Dr.Silva  . BREAST CYST ASPIRATION    . INSERTION  OF MESH N/A 11/01/2016   Procedure: INSERTION OF MESH;  Surgeon: Jackolyn Confer, MD;  Location: [place];  Service: General;  Laterality: N/A;  . RHINOPLASTY    . TONSILLECTOMY    . varicose veins    . [place]  . VENTRAL HERNIA REPAIR N/A 11/01/2016   Procedure: VENTRAL HERNIA REPAIR WITH MESH;  Surgeon: Jackolyn Confer, MD;  Location: [place];  Service: General;  Laterality: N/A;    Current Outpatient Prescriptions  Medication Sig Dispense Refill  . Ascorbic Acid (VITAMIN C) 1000 MG tablet Take 1,000 mg by mouth daily.    Marland Kitchen lactobacillus acidophilus (BACID) TABS tablet Take 1 tablet by mouth 3 (three) times a week. Reported on 12/12/2015    . multivitamin-lutein (OCUVITE-LUTEIN) CAPS Take 1 capsule by mouth daily.    Marland Kitchen OVER THE COUNTER MEDICATION Vitamin D 3 2000 iu taking in the a.m.    . OVER THE COUNTER MEDICATION Flaxseed oil 1000 mg taking daily    . OVER THE COUNTER MEDICATION Epax fish oil Omega 3 taking daily    . OVER THE COUNTER MEDICATION L-thiamine for anxiety    . TRAVATAN Z 0.004 % SOLN ophthalmic solution Place 1 drop into both eyes at bedtime.     No  current facility-administered medications for this visit.     Family History  Problem Relation Age of Onset  . Cancer Maternal Grandmother     breast cancer  . Breast cancer Maternal Grandmother   . Heart disease Maternal Grandfather   . Stroke Brother   . Alcohol abuse Brother   . Liver disease Brother   . Varicose Veins Brother   . Heart disease Brother   . Cancer Mother   . Varicose Veins Father   . Heart disease Father   . Varicose Veins Paternal Grandmother     ROS:  Pertinent items are noted in HPI.  Otherwise, a comprehensive ROS was negative.  Exam:   BP (!) 142/78 (BP Location: Right Arm, Patient Position: Sitting, Cuff Size: Normal)   Pulse 80   Resp 16   Ht 5\' 5"  (1.651 m)   Wt 164 lb (74.4 kg)   LMP 04/05/2016   BMI 27.29 kg/m     General appearance: alert, cooperative  and appears stated age Head: Normocephalic, without obvious abnormality, atraumatic Neck: no adenopathy, supple, symmetrical, trachea midline and thyroid normal to inspection and palpation Lungs: clear to auscultation bilaterally Breasts: normal appearance, no masses or tenderness, No nipple retraction or dimpling, No nipple discharge or bleeding, No axillary or supraclavicular adenopathy Heart: regular rate and rhythm Abdomen: soft, non-tender; no masses, no organomegaly Extremities: extremities normal, atraumatic, no cyanosis or edema Skin: Skin color, texture, turgor normal. No rashes or lesions Lymph nodes: Cervical, supraclavicular, and axillary nodes normal. No abnormal inguinal nodes palpated Neurologic: Grossly normal  Pelvic: External genitalia:  no lesions              Urethra:  normal appearing urethra with no masses, tenderness or lesions              Bartholins and Skenes: normal                 Vagina: normal appearing vagina with normal color and discharge, no lesions              Cervix: no lesions              Pap taken: Yes.   Bimanual Exam:  Uterus:  normal size, contour, position, consistency, mobility, non-tender              Adnexa: no mass, fullness, tenderness              Rectal exam: Yes.  .  Confirms.              Anus:  normal sphincter tone, no lesions  Chaperone was present for exam.  Assessment:   Well woman visit with normal exam. Perimenopausal female. Hx of mid-urethral sling.  Remote hx of ASCUS.  Remote hx of osteopenia. Follow up BMD normal per patient.  Mildly elevated Hgb.  Seeing PCP for all labs.  Plan: Mammogram screening discussed. Recommended self breast awareness. Pap and HR HPV as above. Guidelines for Calcium, Vitamin D, regular exercise program including cardiovascular and weight bearing exercise.   Follow up annually and prn.     After visit summary provided.

## 2017-03-12 ENCOUNTER — Encounter: Payer: Self-pay | Admitting: Obstetrics and Gynecology

## 2017-03-12 ENCOUNTER — Ambulatory Visit (INDEPENDENT_AMBULATORY_CARE_PROVIDER_SITE_OTHER): Payer: BLUE CROSS/BLUE SHIELD | Admitting: Obstetrics and Gynecology

## 2017-03-12 VITALS — BP 142/78 | HR 80 | Resp 16 | Ht 65.0 in | Wt 164.0 lb

## 2017-03-12 DIAGNOSIS — Z01419 Encounter for gynecological examination (general) (routine) without abnormal findings: Secondary | ICD-10-CM

## 2017-03-12 NOTE — Patient Instructions (Signed)

## 2017-03-14 LAB — IPS PAP TEST WITH HPV

## 2017-03-18 ENCOUNTER — Telehealth: Payer: Self-pay | Admitting: *Deleted

## 2017-03-18 NOTE — Telephone Encounter (Signed)
Left message to call Americus Perkey at [phone].  

## 2017-03-18 NOTE — Telephone Encounter (Signed)
Spoke with patient, advised of results and recommendations as seen below per Dr. Quincy Simmonds. Patient had some additional questions about billing, advised patient Rhonda [name] is with a patient, will have billing department return call back. Patient verbalizes understanding and is agreeable.  Routing to provider for final review. Patient is agreeable to disposition. Will close encounter.

## 2017-03-18 NOTE — Telephone Encounter (Signed)
-----   Message from Nunzio Cobbs, MD sent at 03/14/2017  7:50 PM EDT ----- Please report to patient negative pap and HR HPV testing.  Recall - 02.

## 2017-04-02 ENCOUNTER — Other Ambulatory Visit: Payer: Self-pay | Admitting: Internal Medicine

## 2017-04-02 DIAGNOSIS — R911 Solitary pulmonary nodule: Secondary | ICD-10-CM

## 2017-04-03 ENCOUNTER — Ambulatory Visit
Admission: RE | Admit: 2017-04-03 | Discharge: 2017-04-03 | Disposition: A | Discharge status: Home / Self Care | Payer: BLUE CROSS/BLUE SHIELD | Source: Ambulatory Visit | Attending: Internal Medicine | Admitting: Internal Medicine

## 2017-04-03 DIAGNOSIS — R911 Solitary pulmonary nodule: Secondary | ICD-10-CM

## 2017-04-03 DIAGNOSIS — R918 Other nonspecific abnormal finding of lung field: Secondary | ICD-10-CM | POA: Diagnosis not present

## 2017-04-08 DIAGNOSIS — E7800 Pure hypercholesterolemia, unspecified: Secondary | ICD-10-CM | POA: Diagnosis not present

## 2017-04-08 DIAGNOSIS — R911 Solitary pulmonary nodule: Secondary | ICD-10-CM | POA: Diagnosis not present

## 2017-04-08 DIAGNOSIS — E041 Nontoxic single thyroid nodule: Secondary | ICD-10-CM | POA: Diagnosis not present

## 2017-04-17 DIAGNOSIS — Z1211 Encounter for screening for malignant neoplasm of colon: Secondary | ICD-10-CM | POA: Diagnosis not present

## 2017-05-29 DIAGNOSIS — L820 Inflamed seborrheic keratosis: Secondary | ICD-10-CM | POA: Diagnosis not present

## 2017-06-10 DIAGNOSIS — Z1211 Encounter for screening for malignant neoplasm of colon: Secondary | ICD-10-CM | POA: Diagnosis not present

## 2017-07-08 DIAGNOSIS — H43812 Vitreous degeneration, left eye: Secondary | ICD-10-CM | POA: Diagnosis not present

## 2017-07-31 DIAGNOSIS — I83893 Varicose veins of bilateral lower extremities with other complications: Secondary | ICD-10-CM | POA: Diagnosis not present

## 2017-07-31 DIAGNOSIS — I83813 Varicose veins of bilateral lower extremities with pain: Secondary | ICD-10-CM | POA: Diagnosis not present

## 2017-08-07 DIAGNOSIS — I83893 Varicose veins of bilateral lower extremities with other complications: Secondary | ICD-10-CM | POA: Diagnosis not present

## 2017-08-07 DIAGNOSIS — I83813 Varicose veins of bilateral lower extremities with pain: Secondary | ICD-10-CM | POA: Diagnosis not present

## 2017-09-02 DIAGNOSIS — I8311 Varicose veins of right lower extremity with inflammation: Secondary | ICD-10-CM | POA: Diagnosis not present

## 2017-09-02 DIAGNOSIS — I83813 Varicose veins of bilateral lower extremities with pain: Secondary | ICD-10-CM | POA: Diagnosis not present

## 2017-09-02 DIAGNOSIS — I8312 Varicose veins of left lower extremity with inflammation: Secondary | ICD-10-CM | POA: Diagnosis not present

## 2017-09-08 ENCOUNTER — Encounter: Payer: Self-pay | Admitting: Obstetrics and Gynecology

## 2017-09-08 ENCOUNTER — Telehealth: Payer: Self-pay | Admitting: Obstetrics and Gynecology

## 2017-09-08 ENCOUNTER — Ambulatory Visit (INDEPENDENT_AMBULATORY_CARE_PROVIDER_SITE_OTHER): Payer: BLUE CROSS/BLUE SHIELD | Admitting: Obstetrics and Gynecology

## 2017-09-08 VITALS — BP 160/82 | HR 76 | Ht 65.0 in | Wt 158.6 lb

## 2017-09-08 DIAGNOSIS — R350 Frequency of micturition: Secondary | ICD-10-CM

## 2017-09-08 DIAGNOSIS — N760 Acute vaginitis: Secondary | ICD-10-CM | POA: Diagnosis not present

## 2017-09-08 DIAGNOSIS — N841 Polyp of cervix uteri: Secondary | ICD-10-CM

## 2017-09-08 NOTE — Telephone Encounter (Signed)
Patient feels pressure in her bladder and would like to speak to a nurse.

## 2017-09-08 NOTE — Progress Notes (Signed)
GYNECOLOGY  VISIT   HPI: 56 y.o.   Married  Caucasian  female   [phone] with Patient's last menstrual period was 04/05/2016 (approximate).   here for urinary frequency and some vaginal burning.  Up three times a night to void.  DF - more frequency the last week.  Drinking green tea and water.   1 coffee per morning. No sodas.  Last week drink wine in celebration for her birthday.   No dysuria.  Pressure prior to voiding.  Voiding well.  Can stand up to completely empty.   Some vulvar itching and irritation.   Will have surgery on her right for varicose veins and chronic vascular insufficiency.  Dr. Eliot Ford. Anxious about this.  Urine dip - negative.   GYNECOLOGIC HISTORY: Patient's last menstrual period was 04/05/2016 (approximate). Contraception:  Postmenopausal Menopausal hormone therapy:  none Last mammogram:  02-10-17 Density C/Neg/BiRads1:TBC   Last pap smear: 03/12/17 Neg:Neg HR HPV;  02-13-15 Neg:Neg HR HPV;2014 Neg:Neg HR HPV         OB History    Gravida Para Term Preterm AB Living   3 2 2   1 2    SAB TAB Ectopic Multiple Live Births   1                 Patient Active Problem List   Diagnosis Date Noted  . DUB (dysfunctional uterine bleeding) 09/21/2013  . Anxiety   . Other and unspecified hyperlipidemia 04/05/2013    Past Medical History:  Diagnosis Date  . Allergy    seasonal  . Anxiety   . Fibroid   . Glaucoma   . Leg pain, bilateral   . Lung nodules    benign  . Other and unspecified hyperlipidemia 04/05/2013  . Peripheral vascular disease (HCC)    chronic venous insufficiency bilaterally  . PONV (postoperative nausea and vomiting)   . Varicose veins of both lower extremities     Past Surgical History:  Procedure Laterality Date  . BLADDER SUSPENSION  2010   Dr.Silva  . BREAST CYST ASPIRATION    . INSERTION OF MESH N/A 11/01/2016   Procedure: INSERTION OF MESH;  Surgeon: Jackolyn Confer, MD;  Location: [place];  Service: General;   Laterality: N/A;  . RHINOPLASTY    . TONSILLECTOMY    . varicose veins    . [place]  . VENTRAL HERNIA REPAIR N/A 11/01/2016   Procedure: VENTRAL HERNIA REPAIR WITH MESH;  Surgeon: Jackolyn Confer, MD;  Location: [place];  Service: General;  Laterality: N/A;    Current Outpatient Prescriptions  Medication Sig Dispense Refill  . Ascorbic Acid (VITAMIN C) 1000 MG tablet Take 1,000 mg by mouth daily.    Marland Kitchen lactobacillus acidophilus (BACID) TABS tablet Take 1 tablet by mouth 3 (three) times a week. Reported on 12/12/2015    . multivitamin-lutein (OCUVITE-LUTEIN) CAPS Take 1 capsule by mouth daily.    Marland Kitchen OVER THE COUNTER MEDICATION Vitamin D 3 2000 iu taking in the a.m.    . OVER THE COUNTER MEDICATION Flaxseed oil 1000 mg taking daily    . OVER THE COUNTER MEDICATION Epax fish oil Omega 3 taking daily    . OVER THE COUNTER MEDICATION L-thiamine for anxiety    . TRAVATAN Z 0.004 % SOLN ophthalmic solution Place 1 drop into both eyes at bedtime.     No current facility-administered medications for this visit.      ALLERGIES: Diflucan [fluconazole]  Family History  Problem Relation Age of Onset  . Cancer Maternal Grandmother        breast cancer  . Breast cancer Maternal Grandmother   . Heart disease Maternal Grandfather   . Stroke Brother   . Alcohol abuse Brother   . Liver disease Brother   . Varicose Veins Brother   . Heart disease Brother   . Cancer Mother   . Varicose Veins Father   . Heart disease Father   . Varicose Veins Paternal Grandmother     Social History   Social History  . Marital status: Married    Spouse name: N/A  . Number of children: N/A  . Years of education: N/A   Occupational History  . Registered Dental Hygienist Unemployed   Social History Main Topics  . Smoking status: Never Smoker  . Smokeless tobacco: Never Used  . Alcohol use 1.8 - 2.4 oz/week    3 - 4 Standard drinks or equivalent per week     Comment: 3-4 glasses  of wine a week  . Drug use: No  . Sexual activity: Not Currently    Partners: Male    Birth control/ protection: Condom   Other Topics Concern  . Not on file   Social History Narrative   Married. Education: college. Exercise: walk for 1 hour everyday.    ROS:  Pertinent items are noted in HPI.  PHYSICAL EXAMINATION:    BP (!) 160/82 (BP Location: Right Arm, Patient Position: Sitting, Cuff Size: Normal)   Pulse 76   Ht 5\' 5"  (1.651 m)   Wt 158 lb 9.6 oz (71.9 kg)   LMP 04/05/2016 (Approximate)   BMI 26.39 kg/m     General appearance: alert, cooperative and appears stated age   Pelvic: External genitalia:  no lesions              Urethra:  normal appearing urethra with no masses, tenderness or lesions              Bartholins and Skenes: normal                 Vagina: normal appearing vagina with normal color and discharge, no lesions              Cervix:  Polyp removed from cervix after verbal consent.                Bimanual Exam:  Uterus:  normal size, contour, position, consistency, mobility, non-tender              Adnexa: no mass, fullness, tenderness               Chaperone was present for exam.  ASSESSMENT  Urinary frequency.  No UTI. Vulvovaginitis. Cervical polyp.  Anxiety.  PLAN  Affirm done.  tx to follow.  Discussed bladder irritatants.  I am not recommending medication at this time. Polyp to pathology.  Follow up prn.    An After Visit Summary was printed and given to the patient.  __15____ minutes face to face time of which over 50% was spent in counseling.

## 2017-09-08 NOTE — Telephone Encounter (Signed)
Call to patient. Complains of bladder pressure and urinary frequency. Denies actual burning with urination but has external irritatation. Denies back pain or fever. Appointment today at 1230 with Dr Quincy Simmonds.  Routing to provider for final review. Patient agreeable to disposition. Will close encounter.

## 2017-09-09 LAB — VAGINITIS/VAGINOSIS, DNA PROBE
Candida Species: NEGATIVE
Gardnerella vaginalis: NEGATIVE
Trichomonas vaginosis: NEGATIVE

## 2017-09-29 DIAGNOSIS — H401131 Primary open-angle glaucoma, bilateral, mild stage: Secondary | ICD-10-CM | POA: Diagnosis not present

## 2017-10-02 ENCOUNTER — Telehealth: Payer: Self-pay | Admitting: *Deleted

## 2017-10-02 NOTE — Telephone Encounter (Signed)
Pt called in to order more compression stockings. She saw Dr. Scot Dock in 2016 and he suggested compression to help with her venous insuf. Her symptoms are worsening and she wanted to discuss the options for treatment. I told her about laser ablations and the conservative therapy trial as well as the need for an updated reflux study. She states she has been wearing the thigh highs 20-30 mm Hg and does elevate when she can. She also will take Ibuprofen if the pain gets really bad. She will be considering what she should do and will be back in touch if she wants an appt. Follow prn.

## 2017-10-07 ENCOUNTER — Encounter (INDEPENDENT_AMBULATORY_CARE_PROVIDER_SITE_OTHER): Payer: BLUE CROSS/BLUE SHIELD

## 2017-10-07 DIAGNOSIS — I868 Varicose veins of other specified sites: Secondary | ICD-10-CM

## 2017-10-22 DIAGNOSIS — I8311 Varicose veins of right lower extremity with inflammation: Secondary | ICD-10-CM | POA: Diagnosis not present

## 2017-10-22 DIAGNOSIS — I83891 Varicose veins of right lower extremities with other complications: Secondary | ICD-10-CM | POA: Diagnosis not present

## 2017-10-24 DIAGNOSIS — I8311 Varicose veins of right lower extremity with inflammation: Secondary | ICD-10-CM | POA: Diagnosis not present

## 2017-11-12 DIAGNOSIS — I83891 Varicose veins of right lower extremities with other complications: Secondary | ICD-10-CM | POA: Diagnosis not present

## 2017-11-12 DIAGNOSIS — I8311 Varicose veins of right lower extremity with inflammation: Secondary | ICD-10-CM | POA: Diagnosis not present

## 2017-11-17 DIAGNOSIS — I83891 Varicose veins of right lower extremities with other complications: Secondary | ICD-10-CM | POA: Diagnosis not present

## 2017-11-17 DIAGNOSIS — I8311 Varicose veins of right lower extremity with inflammation: Secondary | ICD-10-CM | POA: Diagnosis not present

## 2017-12-01 DIAGNOSIS — M7981 Nontraumatic hematoma of soft tissue: Secondary | ICD-10-CM | POA: Diagnosis not present

## 2017-12-01 DIAGNOSIS — I8311 Varicose veins of right lower extremity with inflammation: Secondary | ICD-10-CM | POA: Diagnosis not present

## 2017-12-01 DIAGNOSIS — I83811 Varicose veins of right lower extremities with pain: Secondary | ICD-10-CM | POA: Diagnosis not present

## 2017-12-16 HISTORY — PX: BIOPSY THYROID: PRO38

## 2017-12-26 ENCOUNTER — Encounter (INDEPENDENT_AMBULATORY_CARE_PROVIDER_SITE_OTHER): Payer: Self-pay

## 2017-12-26 DIAGNOSIS — I868 Varicose veins of other specified sites: Secondary | ICD-10-CM

## 2018-01-01 ENCOUNTER — Other Ambulatory Visit: Payer: Self-pay | Admitting: Obstetrics and Gynecology

## 2018-01-01 DIAGNOSIS — M7981 Nontraumatic hematoma of soft tissue: Secondary | ICD-10-CM | POA: Diagnosis not present

## 2018-01-01 DIAGNOSIS — I83891 Varicose veins of right lower extremities with other complications: Secondary | ICD-10-CM | POA: Diagnosis not present

## 2018-01-01 DIAGNOSIS — Z1231 Encounter for screening mammogram for malignant neoplasm of breast: Secondary | ICD-10-CM

## 2018-01-01 DIAGNOSIS — I8311 Varicose veins of right lower extremity with inflammation: Secondary | ICD-10-CM | POA: Diagnosis not present

## 2018-01-12 DIAGNOSIS — I8312 Varicose veins of left lower extremity with inflammation: Secondary | ICD-10-CM | POA: Diagnosis not present

## 2018-01-12 DIAGNOSIS — I83812 Varicose veins of left lower extremities with pain: Secondary | ICD-10-CM | POA: Diagnosis not present

## 2018-01-16 ENCOUNTER — Other Ambulatory Visit: Payer: Self-pay | Admitting: Internal Medicine

## 2018-01-16 DIAGNOSIS — Z0000 Encounter for general adult medical examination without abnormal findings: Secondary | ICD-10-CM | POA: Diagnosis not present

## 2018-01-16 DIAGNOSIS — E7800 Pure hypercholesterolemia, unspecified: Secondary | ICD-10-CM | POA: Diagnosis not present

## 2018-01-16 DIAGNOSIS — E041 Nontoxic single thyroid nodule: Secondary | ICD-10-CM

## 2018-01-16 DIAGNOSIS — F419 Anxiety disorder, unspecified: Secondary | ICD-10-CM | POA: Diagnosis not present

## 2018-01-16 DIAGNOSIS — R911 Solitary pulmonary nodule: Secondary | ICD-10-CM | POA: Diagnosis not present

## 2018-01-21 ENCOUNTER — Ambulatory Visit
Admission: RE | Admit: 2018-01-21 | Discharge: 2018-01-21 | Disposition: A | Discharge status: Home / Self Care | Payer: BLUE CROSS/BLUE SHIELD | Source: Ambulatory Visit | Attending: Internal Medicine | Admitting: Internal Medicine

## 2018-01-21 DIAGNOSIS — E041 Nontoxic single thyroid nodule: Secondary | ICD-10-CM

## 2018-01-22 DIAGNOSIS — E041 Nontoxic single thyroid nodule: Secondary | ICD-10-CM | POA: Diagnosis not present

## 2018-01-25 ENCOUNTER — Other Ambulatory Visit: Payer: Self-pay | Admitting: Internal Medicine

## 2018-01-25 DIAGNOSIS — E041 Nontoxic single thyroid nodule: Secondary | ICD-10-CM

## 2018-01-26 DIAGNOSIS — I8312 Varicose veins of left lower extremity with inflammation: Secondary | ICD-10-CM | POA: Diagnosis not present

## 2018-01-26 DIAGNOSIS — I83892 Varicose veins of left lower extremities with other complications: Secondary | ICD-10-CM | POA: Diagnosis not present

## 2018-01-27 ENCOUNTER — Other Ambulatory Visit (HOSPITAL_COMMUNITY)
Admission: RE | Admit: 2018-01-27 | Discharge: 2018-01-27 | Disposition: A | Discharge status: Home / Self Care | Payer: BLUE CROSS/BLUE SHIELD | Source: Ambulatory Visit | Attending: Radiology | Admitting: Radiology

## 2018-01-27 ENCOUNTER — Ambulatory Visit
Admission: RE | Admit: 2018-01-27 | Discharge: 2018-01-27 | Disposition: A | Discharge status: Home / Self Care | Payer: BLUE CROSS/BLUE SHIELD | Source: Ambulatory Visit | Attending: Internal Medicine | Admitting: Internal Medicine

## 2018-01-27 DIAGNOSIS — E041 Nontoxic single thyroid nodule: Secondary | ICD-10-CM | POA: Diagnosis not present

## 2018-02-11 ENCOUNTER — Ambulatory Visit
Admission: RE | Admit: 2018-02-11 | Discharge: 2018-02-11 | Disposition: A | Discharge status: Home / Self Care | Payer: BLUE CROSS/BLUE SHIELD | Source: Ambulatory Visit | Attending: Obstetrics and Gynecology | Admitting: Obstetrics and Gynecology

## 2018-02-11 DIAGNOSIS — Z1231 Encounter for screening mammogram for malignant neoplasm of breast: Secondary | ICD-10-CM | POA: Diagnosis not present

## 2018-02-20 DIAGNOSIS — I83812 Varicose veins of left lower extremities with pain: Secondary | ICD-10-CM | POA: Diagnosis not present

## 2018-02-20 DIAGNOSIS — I8312 Varicose veins of left lower extremity with inflammation: Secondary | ICD-10-CM | POA: Diagnosis not present

## 2018-03-18 ENCOUNTER — Encounter: Payer: Self-pay | Admitting: Obstetrics and Gynecology

## 2018-03-18 ENCOUNTER — Ambulatory Visit (INDEPENDENT_AMBULATORY_CARE_PROVIDER_SITE_OTHER): Payer: BLUE CROSS/BLUE SHIELD | Admitting: Obstetrics and Gynecology

## 2018-03-18 ENCOUNTER — Other Ambulatory Visit: Payer: Self-pay

## 2018-03-18 VITALS — BP 138/82 | HR 80 | Resp 18 | Ht 65.0 in | Wt 160.0 lb

## 2018-03-18 DIAGNOSIS — Z01419 Encounter for gynecological examination (general) (routine) without abnormal findings: Secondary | ICD-10-CM

## 2018-03-18 NOTE — Patient Instructions (Signed)

## 2018-03-18 NOTE — Progress Notes (Signed)
57 y.o. G59P2012 Married Caucasian female here for annual exam.    Some hot flashes.  Vaginal dryness.  Notes rosacea in her menopause.  Denies vaginal bleeding.   Good bladder control. No problems with bowel functioning.   Doing a healthy diet and working on weight loss.   Just finished with varicose vein treatment last month.  Has had some activity restriction.   Son and daughter in law working on their dissertations.  She is very proud of them! Husband retiring early.  [place] move to [place].  Labs with PCP.  Just had complete physical in Feb. 2018.   PCP: Emi Belfast, MD   Patient's last menstrual period was 04/05/2016 (approximate).           Sexually active: Yes.    The current method of family planning is post menopausal status.    Exercising: Yes.    treadmill Smoker:  no  Health Maintenance: Pap:  03/12/17 -  Neg:Neg HR HPV History of abnormal Pap:   Yes, ASCUS 2014 MMG:  02/11/18 BIRADS 1 negative/density b Colonoscopy:  05/2017 normal;next 10years BMD:   10/17/03  Result  Osteopenia of hip and spine.  States she had a follow up BMD done which was normal.  We do not have a record of that.   TDaP:  January 2018 Gardasil:   n/a HIV: 2017 negative Hep C: 2017 negative Screening Labs:      reports that she has never smoked. She has never used smokeless tobacco. She reports that she drinks about 4.2 oz of alcohol per week. She reports that she does not use drugs.  Past Medical History:  Diagnosis Date  . Allergy    seasonal  . Anxiety   . Fibroid   . Glaucoma   . Leg pain, bilateral   . Lung nodules    benign  . Other and unspecified hyperlipidemia 04/05/2013  . Peripheral vascular disease (HCC)    chronic venous insufficiency bilaterally  . PONV (postoperative nausea and vomiting)   . Varicose veins of both lower extremities     Past Surgical History:  Procedure Laterality Date  . BLADDER SUSPENSION  2010   Dr.Silva  . BREAST CYST ASPIRATION Right  09/08/2012  . INSERTION OF MESH N/A 11/01/2016   Procedure: INSERTION OF MESH;  Surgeon: Jackolyn Confer, MD;  Location: [place];  Service: General;  Laterality: N/A;  . RHINOPLASTY    . TONSILLECTOMY    . varicose veins    . [place]  . VENTRAL HERNIA REPAIR N/A 11/01/2016   Procedure: VENTRAL HERNIA REPAIR WITH MESH;  Surgeon: Jackolyn Confer, MD;  Location: [place];  Service: General;  Laterality: N/A;    Current Outpatient Medications  Medication Sig Dispense Refill  . Ascorbic Acid (VITAMIN C) 1000 MG tablet Take 1,000 mg by mouth daily.    Marland Kitchen lactobacillus acidophilus (BACID) TABS tablet Take 1 tablet by mouth 3 (three) times a week. Reported on 12/12/2015    . Magnesium 300 MG CAPS Take 1 capsule by mouth daily.    . multivitamin-lutein (OCUVITE-LUTEIN) CAPS Take 1 capsule by mouth daily.    Marland Kitchen OVER THE COUNTER MEDICATION Vitamin D 3 2000 iu taking in the a.m.    . OVER THE COUNTER MEDICATION Flaxseed oil 1000 mg taking daily    . OVER THE COUNTER MEDICATION Epax fish oil Omega 3 taking daily    . OVER THE COUNTER MEDICATION L-thiamine for anxiety    .  TRAVATAN Z 0.004 % SOLN ophthalmic solution Place 1 drop into both eyes at bedtime.     No current facility-administered medications for this visit.     Family History  Problem Relation Age of Onset  . Cancer Maternal Grandmother        breast cancer  . Breast cancer Maternal Grandmother        unsure of age  . Heart disease Maternal Grandfather   . Stroke Brother   . Alcohol abuse Brother   . Liver disease Brother   . Varicose Veins Brother   . Heart disease Brother   . Cancer Mother   . Varicose Veins Father   . Heart disease Father   . Varicose Veins Paternal Grandmother     ROS:  Pertinent items are noted in HPI.  Otherwise, a comprehensive ROS was negative.  Exam:   BP 138/82 (BP Location: Right Arm, Patient Position: Sitting, Cuff Size: Normal)   Pulse 80   Resp 18   Ht 5\' 5"  (1.651 m)    Wt 160 lb (72.6 kg)   LMP 04/05/2016 (Approximate)   BMI 26.63 kg/m     General appearance: alert, cooperative and appears stated age Head: Normocephalic, without obvious abnormality, atraumatic Neck: no adenopathy, supple, symmetrical, trachea midline and thyroid normal to inspection and palpation Lungs: clear to auscultation bilaterally Breasts: normal appearance, no masses or tenderness, No nipple retraction or dimpling, No nipple discharge or bleeding, No axillary or supraclavicular adenopathy Heart: regular rate and rhythm Abdomen: soft, non-tender; no masses, no organomegaly Extremities: extremities normal, atraumatic, no cyanosis or edema Skin: Skin color, texture, turgor normal. No rashes or lesions Lymph nodes: Cervical, supraclavicular, and axillary nodes normal. No abnormal inguinal nodes palpated Neurologic: Grossly normal  Pelvic: External genitalia:  no lesions              Urethra:  normal appearing urethra with no masses, tenderness or lesions              Bartholins and Skenes: normal                 Vagina: normal appearing vagina with normal color and discharge, no lesions              Cervix: no lesions              Pap taken: No. Bimanual Exam:  Uterus:  normal size, contour, position, consistency, mobility, non-tender              Adnexa: no mass, fullness, tenderness              Rectal exam: Yes.  .  Confirms.              Anus:  normal sphincter tone, no lesions  Chaperone was present for exam.  Assessment:   Well woman visit with normal exam. Postmenopausal female. Hx of mid-urethral sling.  Remote hx of ASCUS.  Follow up normal.  Remote hx of osteopenia. Follow up BMD normal per patient.  Hx varicose veins.  Vaginal dryness.  Plan: Mammogram screening discussed. Recommended self breast awareness. Pap and HR HPV as above. Guidelines for Calcium, Vitamin D, regular exercise program including cardiovascular and weight bearing exercise. BMD in her  35s.  Labs through PCP.  We discussed water based lubricants, cooking oils, and local vaginal estrogen.  She chooses water based lubricants. Follow up annually and prn.    After visit summary provided.

## 2018-04-09 DIAGNOSIS — H401131 Primary open-angle glaucoma, bilateral, mild stage: Secondary | ICD-10-CM | POA: Diagnosis not present

## 2018-05-13 DIAGNOSIS — L700 Acne vulgaris: Secondary | ICD-10-CM | POA: Diagnosis not present

## 2018-05-21 DIAGNOSIS — I8312 Varicose veins of left lower extremity with inflammation: Secondary | ICD-10-CM | POA: Diagnosis not present

## 2018-05-21 DIAGNOSIS — I8311 Varicose veins of right lower extremity with inflammation: Secondary | ICD-10-CM | POA: Diagnosis not present

## 2018-06-24 DIAGNOSIS — L700 Acne vulgaris: Secondary | ICD-10-CM | POA: Diagnosis not present

## 2018-08-05 DIAGNOSIS — L738 Other specified follicular disorders: Secondary | ICD-10-CM | POA: Diagnosis not present

## 2018-09-23 DIAGNOSIS — H401131 Primary open-angle glaucoma, bilateral, mild stage: Secondary | ICD-10-CM | POA: Diagnosis not present

## 2019-01-01 ENCOUNTER — Telehealth: Payer: Self-pay | Admitting: Obstetrics and Gynecology

## 2019-01-01 NOTE — Telephone Encounter (Signed)
Patient aware of new guidelines for mammograms stating women 28 and older should get every other year. She would like an opinion on whether she should switch to every other year or continue to have every year.

## 2019-01-04 ENCOUNTER — Other Ambulatory Visit: Payer: Self-pay | Admitting: Obstetrics and Gynecology

## 2019-01-04 DIAGNOSIS — Z1231 Encounter for screening mammogram for malignant neoplasm of breast: Secondary | ICD-10-CM

## 2019-01-04 NOTE — Telephone Encounter (Signed)
Spoke with patient. Patient asking if yearly MMG is still recommended? Patient states she received information in the mail from [employer] and cancer.org stating women 54 and older should get MMG every other year.   Advised yearly MMG is still recommended. Will review with Dr. Quincy Simmonds and return call if any additional info is provided regarding guidelines. Patient verbalizes understanding and is agreeable.   Routing to provider for final review. Patient is agreeable to disposition. Will close encounter.

## 2019-01-19 DIAGNOSIS — E7800 Pure hypercholesterolemia, unspecified: Secondary | ICD-10-CM | POA: Diagnosis not present

## 2019-01-19 DIAGNOSIS — Z0000 Encounter for general adult medical examination without abnormal findings: Secondary | ICD-10-CM | POA: Diagnosis not present

## 2019-01-19 DIAGNOSIS — E041 Nontoxic single thyroid nodule: Secondary | ICD-10-CM | POA: Diagnosis not present

## 2019-01-19 DIAGNOSIS — F419 Anxiety disorder, unspecified: Secondary | ICD-10-CM | POA: Diagnosis not present

## 2019-01-19 DIAGNOSIS — R911 Solitary pulmonary nodule: Secondary | ICD-10-CM | POA: Diagnosis not present

## 2019-01-20 DIAGNOSIS — H6121 Impacted cerumen, right ear: Secondary | ICD-10-CM | POA: Diagnosis not present

## 2019-02-12 ENCOUNTER — Ambulatory Visit
Admission: RE | Admit: 2019-02-12 | Discharge: 2019-02-12 | Disposition: A | Discharge status: Home / Self Care | Payer: BLUE CROSS/BLUE SHIELD | Source: Ambulatory Visit | Attending: Obstetrics and Gynecology | Admitting: Obstetrics and Gynecology

## 2019-02-12 DIAGNOSIS — Z1231 Encounter for screening mammogram for malignant neoplasm of breast: Secondary | ICD-10-CM | POA: Diagnosis not present

## 2019-02-14 DIAGNOSIS — C801 Malignant (primary) neoplasm, unspecified: Secondary | ICD-10-CM

## 2019-02-14 HISTORY — DX: Malignant (primary) neoplasm, unspecified: C80.1

## 2019-02-16 ENCOUNTER — Other Ambulatory Visit: Payer: Self-pay | Admitting: Obstetrics and Gynecology

## 2019-02-16 DIAGNOSIS — R928 Other abnormal and inconclusive findings on diagnostic imaging of breast: Secondary | ICD-10-CM

## 2019-02-17 ENCOUNTER — Other Ambulatory Visit: Payer: Self-pay | Admitting: Obstetrics and Gynecology

## 2019-02-17 ENCOUNTER — Ambulatory Visit
Admission: RE | Admit: 2019-02-17 | Discharge: 2019-02-17 | Disposition: A | Discharge status: Home / Self Care | Payer: BLUE CROSS/BLUE SHIELD | Source: Ambulatory Visit | Attending: Obstetrics and Gynecology | Admitting: Obstetrics and Gynecology

## 2019-02-17 ENCOUNTER — Ambulatory Visit
Admission: RE | Admit: 2019-02-17 | Discharge: 2019-02-17 | Disposition: A | Discharge status: Home / Self Care | Payer: PRIVATE HEALTH INSURANCE | Source: Ambulatory Visit | Attending: Obstetrics and Gynecology | Admitting: Obstetrics and Gynecology

## 2019-02-17 DIAGNOSIS — N6311 Unspecified lump in the right breast, upper outer quadrant: Secondary | ICD-10-CM | POA: Diagnosis not present

## 2019-02-17 DIAGNOSIS — N6310 Unspecified lump in the right breast, unspecified quadrant: Secondary | ICD-10-CM

## 2019-02-17 DIAGNOSIS — N6312 Unspecified lump in the right breast, upper inner quadrant: Secondary | ICD-10-CM | POA: Diagnosis not present

## 2019-02-17 DIAGNOSIS — R928 Other abnormal and inconclusive findings on diagnostic imaging of breast: Secondary | ICD-10-CM

## 2019-02-17 DIAGNOSIS — R922 Inconclusive mammogram: Secondary | ICD-10-CM | POA: Diagnosis not present

## 2019-02-22 ENCOUNTER — Ambulatory Visit
Admission: RE | Admit: 2019-02-22 | Discharge: 2019-02-22 | Disposition: A | Discharge status: Home / Self Care | Payer: BLUE CROSS/BLUE SHIELD | Source: Ambulatory Visit | Attending: Obstetrics and Gynecology | Admitting: Obstetrics and Gynecology

## 2019-02-22 DIAGNOSIS — N6310 Unspecified lump in the right breast, unspecified quadrant: Secondary | ICD-10-CM

## 2019-02-22 DIAGNOSIS — N6312 Unspecified lump in the right breast, upper inner quadrant: Secondary | ICD-10-CM | POA: Diagnosis not present

## 2019-02-22 DIAGNOSIS — C50211 Malignant neoplasm of upper-inner quadrant of right female breast: Secondary | ICD-10-CM | POA: Diagnosis not present

## 2019-02-23 ENCOUNTER — Telehealth: Payer: Self-pay | Admitting: Oncology

## 2019-02-23 ENCOUNTER — Telehealth: Payer: Self-pay | Admitting: Obstetrics and Gynecology

## 2019-02-23 NOTE — Telephone Encounter (Signed)
Spoke to patients husband to confirm morning [facility] appointment for 3/18, packet emailed to patient

## 2019-02-23 NOTE — Telephone Encounter (Signed)
Routing to Dr. Silva to review.  

## 2019-02-23 NOTE — Telephone Encounter (Signed)
Patient was recently diagnosed with breast cancer. She was referred to the [place] at [facility] and wants to be sure that this is the best place she can go to. Would love Dr. Elza Rafter input. Appointment is next week.

## 2019-02-24 NOTE — Telephone Encounter (Signed)
Spoke with patient, advised as seen below per Dr. Quincy Simmonds. Patient is scheduled with [facility] breast clinic on 03/03/19. Questions answered. Is aware to call with any concerns. Patient verbalizes understanding and is agreeable.   Length of call 63min.   Routing to provider for final review. Patient is agreeable to disposition. Will close encounter.

## 2019-02-24 NOTE — Telephone Encounter (Signed)
I do recommend the [facility].  They have excellent coordination of care for breast cancer.  Have her bring someone with her to her appointment.  If I can do anything for her, please let me know!

## 2019-03-01 ENCOUNTER — Encounter: Payer: Self-pay | Admitting: *Deleted

## 2019-03-01 DIAGNOSIS — Z170 Estrogen receptor positive status [ER+]: Principal | ICD-10-CM

## 2019-03-01 DIAGNOSIS — C50211 Malignant neoplasm of upper-inner quadrant of right female breast: Secondary | ICD-10-CM

## 2019-03-02 NOTE — Progress Notes (Signed)
[facility]  [phone] [phone]    ID: Drucie Ip DOB: 06/19/1961  MR#: [id number]  WYO#:[id number]  Patient Care Team: Lanice Shirts, MD as PCP - General (Internal Medicine) Mauro Kaufmann, RN as Oncology Nurse Navigator Rockwell Germany, RN as Oncology Nurse Navigator Alphonsa Overall, MD as Consulting Physician (General Surgery) Magrinat, Virgie Dad, MD as Consulting Physician (Oncology) Gery Pray, MD as Consulting Physician (Radiation Oncology) Nunzio Cobbs, MD as Consulting Physician (Obstetrics and Gynecology) Macario Carls, MD as Referring Physician (Dermatology) Katy Apo, MD as Consulting Physician (Ophthalmology) Linus Mako, MD as Consulting Physician (Vascular Surgery) OTHER MD:   CHIEF COMPLAINT: Estrogen receptor positive breast cancer  CURRENT TREATMENT: Awaiting definitive surgery   HISTORY OF CURRENT ILLNESS: Drucie Ip had routine screening mammography on 02/12/2019 showing a possible abnormality in the right breast. She underwent unilateral right diagnostic mammography with tomography and right breast ultrasonography at [facility] on 02/17/2019 showing: Breast Density Category C. a persistent irregular mass measuring approximately 0.8 cm. Ultrasound of the right breast at 12 o'clock, 5 cm from the nipple demonstrates an irregular hypoechoic mass with indistinct margins measuring 0.7 x 0.5 x 0.7 cm. No blood flow seen within the mass on color Doppler imaging. Ultrasound of the right axilla demonstrates multiple normal-appearing nodes.  Accordingly on 02/22/2019 she proceeded to biopsy of the right breast area in question. 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 Prognostic indicators significant for: estrogen receptor, 100% positive and progesterone receptor, 100% positive, both with strong staining intensity.  Proliferation marker Ki67 at 1%. HER2 equivocal (2+) by immunohistochemistry but negative by fluorescent in situ hybridization with a signals ratio 1.32 and number per cell 1.85.   The patient's subsequent history is as detailed below.   INTERVAL HISTORY: Azriel was evaluated in the multidisciplinary breast cancer clinic on 03/03/2019 accompanied by her husband.   Her case was also presented at the multidisciplinary breast cancer conference on the same day. At that time a preliminary plan was proposed: Genetics testing, breast conserving surgery with sentinel lymph node sampling, adjuvant radiation, adjuvant antiestrogens and this is no Oncotype or chemotherapy planned unless after surgery the cancer proves to be larger than imaged)   REVIEW OF SYSTEMS: Vesper is feeling overwhelmed about her diagnosis. For exercise, Izora Gala power walks. There were no specific symptoms leading to the original mammogram, which was routinely scheduled. The patient denies unusual headaches, visual changes, nausea, vomiting, stiff neck, dizziness, or gait imbalance. There has been no cough, phlegm production, or pleurisy, no chest pain or pressure, and no change in bowel or bladder habits. The patient denies fever, rash, bleeding, unexplained fatigue or unexplained weight loss. A detailed review of systems was otherwise entirely negative.   PAST MEDICAL HISTORY: Past Medical History:  Diagnosis Date   Allergy    seasonal   Anxiety    Fibroid    Glaucoma    Leg pain, bilateral    Lung nodules    benign   Other and unspecified hyperlipidemia 04/05/2013   Peripheral vascular disease (HCC)    chronic venous insufficiency bilaterally   PONV (postoperative nausea and vomiting)    Varicose veins of both lower extremities      PAST SURGICAL HISTORY: Past Surgical History:  Procedure Laterality Date   BLADDER SUSPENSION  2010   Dr.Silva   BREAST CYST ASPIRATION Right 09/08/2012   INSERTION OF MESH  N/A 11/01/2016   Procedure: INSERTION OF MESH;  Surgeon: Jackolyn Confer, MD;  Location: [facility];  Service: General;  Laterality: N/A;   RHINOPLASTY     TONSILLECTOMY     varicose veins     VEIN LIGATION AND STRIPPING  1995   VENTRAL HERNIA REPAIR N/A 11/01/2016   Procedure: VENTRAL HERNIA REPAIR WITH MESH;  Surgeon: Jackolyn Confer, MD;  Location: [facility];  Service: General;  Laterality: N/A;     FAMILY HISTORY: Family History  Problem Relation Age of Onset   Cancer Maternal Grandmother        breast cancer   Breast cancer Maternal Grandmother        unsure of age   Heart disease Maternal Grandfather    Stroke Brother    Alcohol abuse Brother    Liver disease Brother    Varicose Veins Brother    Heart disease Brother    Prostate cancer Brother    Cancer Mother    Tracheal cancer Mother    Varicose Veins Father    Heart disease Father    Prostate cancer Father    Varicose Veins Paternal Grandmother    Shallon's father died from congestive heart failure at age 6. Patients' mother died from tracheal carcinoma at age 47. The patient has 4 brothers and 1 sister. Patient denies anyone in her family having ovarian, or pancreatic cancer. Albie's mother was diagnosed with tracheal carcinoma and acoustic neuroma at age 29. Nastacia's father was diagnosed with prostate cancer in his 34's. Zeriah's brother was diagnosed with prostate cancer in his mid 39's. Teairra's maternal grandmother was diagnosed with breast cancer in her late 56's.    GYNECOLOGIC HISTORY:  Patient's last menstrual period was 04/05/2016 (approximate). 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 Her husband, Richardson Landry, is a retired Community education officer for [employer]. They have two children, Copywriter, advertising and  [employer]. Remo Lipps is 37, lives in [place], [place], and is a music professor. Cherly Beach is 15, lives in [place], [place], and recently graduated with for business. [employer].    ADVANCED DIRECTIVES: In the absence of any documentation, Conor's spouse, Richardson Landry, is her healthcare power of attorney.      HEALTH MAINTENANCE: Social History   Tobacco Use   Smoking status: Never Smoker   Smokeless tobacco: Never Used  Substance Use Topics   Alcohol use: Yes    Alcohol/week: 7.0 standard drinks    Types: 7 Glasses of wine per week    Comment: 3-4 glasses of wine a week   Drug use: No    Colonoscopy: yes, 05/2017, normal; Dr. Darrel Hoover   PAP: 03/2017  Bone density: yes, 2008 Mammography: 02/12/2019  Allergies  Allergen Reactions   Diflucan [Fluconazole] Anaphylaxis    Tongue swelling    Current Outpatient Medications  Medication Sig Dispense Refill   Ascorbic Acid (VITAMIN C) 1000 MG tablet Take 1,000 mg by mouth daily.     lactobacillus acidophilus (BACID) TABS tablet Take 1 tablet by mouth 3 (three) times a week. Reported on 12/12/2015     Magnesium 300 MG CAPS Take 1 capsule by mouth daily.     multivitamin-lutein (OCUVITE-LUTEIN) CAPS Take 1 capsule by mouth daily.     OVER THE COUNTER MEDICATION Vitamin  D 3 2000 iu taking in the a.m.     OVER THE COUNTER MEDICATION Flaxseed oil 1000 mg taking daily     OVER THE COUNTER MEDICATION Epax fish oil Omega 3 taking daily     OVER THE COUNTER MEDICATION L-thiamine for anxiety     TRAVATAN Z 0.004 % SOLN ophthalmic solution Place 1 drop into both eyes at bedtime.     ACZONE 7.5 % GEL      No current facility-administered medications for this visit.      OBJECTIVE: Latest white woman in no acute distress  Vitals:   03/03/19 0917  BP: (!) 151/82  Pulse: 90  Resp: 18  Temp: 98.5 F (36.9 C)  SpO2: 100%     Body mass index is 26.33 kg/m.   Wt Readings from Last 3 Encounters:  03/03/19 158 lb 3.2 oz (71.8  kg)  03/18/18 160 lb (72.6 kg)  09/08/17 158 lb 9.6 oz (71.9 kg)      ECOG FS:1 - Symptomatic but completely ambulatory  Ocular: Sclerae unicteric, pupils round and equal Ear-nose-throat: Oropharynx clear and moist Lymphatic: No cervical or supraclavicular adenopathy Lungs no rales or rhonchi Heart regular rate and rhythm Abd soft, nontender, positive bowel sounds MSK no focal spinal tenderness, no joint edema Neuro: non-focal, well-oriented, anxious affect Breasts: The right breast is status post recent biopsy.  It is otherwise unremarkable.  The left breast is benign.  Both axillae are benign.   LAB RESULTS:  CMP     Component Value Date/Time   NA 141 03/03/2019 0820   K 4.1 03/03/2019 0820   CL 105 03/03/2019 0820   CO2 24 03/03/2019 0820   GLUCOSE 101 (H) 03/03/2019 0820   BUN 15 03/03/2019 0820   CREATININE 0.83 03/03/2019 0820   CREATININE 0.74 12/12/2015 1722   CALCIUM 9.1 03/03/2019 0820   PROT 7.1 03/03/2019 0820   ALBUMIN 4.1 03/03/2019 0820   AST 16 03/03/2019 0820   ALT 16 03/03/2019 0820   ALKPHOS 61 03/03/2019 0820   BILITOT 0.8 03/03/2019 0820   GFRNONAA >60 03/03/2019 0820   GFRNONAA >89 12/12/2015 1722   GFRAA >60 03/03/2019 0820   GFRAA >89 12/12/2015 1722    No results found for: TOTALPROTELP, ALBUMINELP, A1GS, A2GS, BETS, BETA2SER, GAMS, MSPIKE, SPEI  No results found for: KPAFRELGTCHN, LAMBDASER, KAPLAMBRATIO  Lab Results  Component Value Date   WBC 6.4 03/03/2019   NEUTROABS 5.1 03/03/2019   HGB 15.6 (H) 03/03/2019   HCT 47.0 (H) 03/03/2019   MCV 92.9 03/03/2019   PLT 230 03/03/2019    _0 @  No results found for: LABCA2  No components found for: [id number]  No results for input(s): INR in the last 168 hours.  No results found for: LABCA2  No results found for: SLH734  No results found for: KAJ681  No results found for: LXB262  No results found for: CA2729  No components found for: HGQUANT  No results found  for: CEA1 / No results found for: CEA1   No results found for: AFPTUMOR  No results found for: CHROMOGRNA  No results found for: PSA1  Appointment on 03/03/2019  Component Date Value Ref Range Status   Sodium 03/03/2019 141  135 - 145 mmol/L Final   Potassium 03/03/2019 4.1  3.5 - 5.1 mmol/L Final   Chloride 03/03/2019 105  98 - 111 mmol/L Final   CO2 03/03/2019 24  22 - 32 mmol/L Final   Glucose, Bld 03/03/2019 101* 70 -  99 mg/dL Final   BUN 03/03/2019 15  6 - 20 mg/dL Final   Creatinine 03/03/2019 0.83  0.44 - 1.00 mg/dL Final   Calcium 03/03/2019 9.1  8.9 - 10.3 mg/dL Final   Total Protein 03/03/2019 7.1  6.5 - 8.1 g/dL Final   Albumin 03/03/2019 4.1  3.5 - 5.0 g/dL Final   AST 03/03/2019 16  15 - 41 U/L Final   ALT 03/03/2019 16  0 - 44 U/L Final   Alkaline Phosphatase 03/03/2019 61  38 - 126 U/L Final   Total Bilirubin 03/03/2019 0.8  0.3 - 1.2 mg/dL Final   GFR, Est Non Af Am 03/03/2019 >60  >60 mL/min Final   GFR, Est AFR Am 03/03/2019 >60  >60 mL/min Final   Anion gap 03/03/2019 12  5 - 15 Final   Performed at [facility], [place] [address] [place], [place] [address]   WBC Count 03/03/2019 6.4  4.0 - 10.5 K/uL Final   RBC 03/03/2019 5.06  3.87 - 5.11 MIL/uL Final   Hemoglobin 03/03/2019 15.6* 12.0 - 15.0 g/dL Final   HCT 03/03/2019 47.0* 36.0 - 46.0 % Final   MCV 03/03/2019 92.9  80.0 - 100.0 fL Final   MCH 03/03/2019 30.8  26.0 - 34.0 pg Final   MCHC 03/03/2019 33.2  30.0 - 36.0 g/dL Final   RDW 03/03/2019 11.9  11.5 - 15.5 % Final   Platelet Count 03/03/2019 230  150 - 400 K/uL Final   nRBC 03/03/2019 0.0  0.0 - 0.2 % Final   Neutrophils Relative % 03/03/2019 78  % Final   Neutro Abs 03/03/2019 5.1  1.7 - 7.7 K/uL Final   Lymphocytes Relative 03/03/2019 15  % Final   Lymphs Abs 03/03/2019 0.9  0.7 - 4.0 K/uL Final   Monocytes Relative 03/03/2019 5  % Final   Monocytes Absolute 03/03/2019 0.3  0.1 - 1.0 K/uL  Final   Eosinophils Relative 03/03/2019 1  % Final   Eosinophils Absolute 03/03/2019 0.0  0.0 - 0.5 K/uL Final   Basophils Relative 03/03/2019 1  % Final   Basophils Absolute 03/03/2019 0.0  0.0 - 0.1 K/uL Final   Immature Granulocytes 03/03/2019 0  % Final   Abs Immature Granulocytes 03/03/2019 0.02  0.00 - 0.07 K/uL Final   Performed at [facility], [place] [address] [place], [place] [address]    (this displays the last labs from the last 3 days)  No results found for: TOTALPROTELP, ALBUMINELP, A1GS, A2GS, BETS, BETA2SER, GAMS, MSPIKE, SPEI (this displays SPEP labs)  No results found for: KPAFRELGTCHN, LAMBDASER, KAPLAMBRATIO (kappa/lambda light chains)  No results found for: HGBA, HGBA2QUANT, HGBFQUANT, HGBSQUAN (Hemoglobinopathy evaluation)   No results found for: LDH  No results found for: IRON, TIBC, IRONPCTSAT (Iron and TIBC)  No results found for: FERRITIN  Urinalysis    Component Value Date/Time   COLORURINE YELLOW 05/29/2009 1120   APPEARANCEUR CLEAR 05/29/2009 1120   LABSPEC 1.015 05/29/2009 1120   PHURINE 7.5 05/29/2009 1120   GLUCOSEU NEGATIVE 05/29/2009 1120   [address] 05/29/2009 1120   BILIRUBINUR negative 12/12/2015 1735   BILIRUBINUR n 02/13/2015 1150   KETONESUR negative 12/12/2015 1735   KETONESUR NEGATIVE 05/29/2009 1120   PROTEINUR negative 12/12/2015 1735   PROTEINUR n 02/13/2015 1150   PROTEINUR NEGATIVE 05/29/2009 1120   UROBILINOGEN 0.2 12/12/2015 1735   UROBILINOGEN 0.2 05/29/2009 1120   NITRITE Negative 12/12/2015 1735   NITRITE n 02/13/2015 1150  NITRITE NEGATIVE 05/29/2009 1120   LEUKOCYTESUR Negative 12/12/2015 1735     STUDIES:  US Breast Ltd Uni Right Inc Axilla  Result Date: 02/17/2019 CLINICAL DATA:  Screening recall for a possible right breast mass. EXAM: DIGITAL DIAGNOSTIC RIGHT MAMMOGRAM WITH CAD AND TOMO ULTRASOUND RIGHT BREAST COMPARISON:  Previous exam(s). ACR Breast Density Category c:  The breast tissue is heterogeneously dense, which may obscure small masses. FINDINGS: Spot compression tomosynthesis images of the superior right breast demonstrates a persistent irregular mass measuring approximately 8 mm. Mammographic images were processed with CAD. Ultrasound of the right breast at 12 o'clock, 5 cm from the nipple demonstrates an irregular hypoechoic mass with indistinct margins measuring 7 x 5 x 7 mm. No blood flow seen within the mass on color Doppler imaging. Ultrasound of the right axilla demonstrates multiple normal-appearing nodes. IMPRESSION: 1.  There is a suspicious mass in the right breast at 12 o'clock. 2.  No evidence of right axillary lymphadenopathy. RECOMMENDATION: Ultrasound guided biopsy is recommended for the right breast mass. This has been scheduled for 02/22/2019 at 1:45 p.m. I have discussed the findings and recommendations with the patient. Results were also provided in writing at the conclusion of the visit. If applicable, a reminder letter will be sent to the patient regarding the next appointment. BI-RADS CATEGORY  4: Suspicious. Electronically Signed   By: Ammie Ferrier M.D.   On: 02/17/2019 15:00   Mm Diag Breast Tomo Uni Right  Result Date: 02/17/2019 CLINICAL DATA:  Screening recall for a possible right breast mass. EXAM: DIGITAL DIAGNOSTIC RIGHT MAMMOGRAM WITH CAD AND TOMO ULTRASOUND RIGHT BREAST COMPARISON:  Previous exam(s). ACR Breast Density Category c: The breast tissue is heterogeneously dense, which may obscure small masses. FINDINGS: Spot compression tomosynthesis images of the superior right breast demonstrates a persistent irregular mass measuring approximately 8 mm. Mammographic images were processed with CAD. Ultrasound of the right breast at 12 o'clock, 5 cm from the nipple demonstrates an irregular hypoechoic mass with indistinct margins measuring 7 x 5 x 7 mm. No blood flow seen within the mass on color Doppler imaging. Ultrasound of the right  axilla demonstrates multiple normal-appearing nodes. IMPRESSION: 1.  There is a suspicious mass in the right breast at 12 o'clock. 2.  No evidence of right axillary lymphadenopathy. RECOMMENDATION: Ultrasound guided biopsy is recommended for the right breast mass. This has been scheduled for 02/22/2019 at 1:45 p.m. I have discussed the findings and recommendations with the patient. Results were also provided in writing at the conclusion of the visit. If applicable, a reminder letter will be sent to the patient regarding the next appointment. BI-RADS CATEGORY  4: Suspicious. Electronically Signed   By: Ammie Ferrier M.D.   On: 02/17/2019 15:00   Mm 3d Screen Breast Bilateral  Result Date: 02/15/2019 CLINICAL DATA:  Screening. EXAM: DIGITAL SCREENING BILATERAL MAMMOGRAM WITH TOMO AND CAD COMPARISON:  Previous exam(s). ACR Breast Density Category c: The breast tissue is heterogeneously dense, which may obscure small masses. FINDINGS: In the right breast, a possible mass warrants further evaluation. In the left breast, no findings suspicious for malignancy. Images were processed with CAD. IMPRESSION: Further evaluation is suggested for possible mass in the right breast. RECOMMENDATION: Diagnostic mammogram and possibly ultrasound of the right breast. (Code:[id number]) The patient will be contacted regarding the findings, and additional imaging will be scheduled. BI-RADS CATEGORY  0: Incomplete. Need additional imaging evaluation and/or prior mammograms for comparison. Electronically Signed   By: Mallie Darting.D.  On: 02/15/2019 08:20   Mm Clip Placement Right  Result Date: 02/22/2019 CLINICAL DATA:  Evaluate clip placement following ultrasound-guided RIGHT breast biopsy. EXAM: DIAGNOSTIC RIGHT MAMMOGRAM POST ULTRASOUND BIOPSY COMPARISON:  Previous exam(s). FINDINGS: Mammographic images were obtained following ultrasound guided biopsy of the 0.7 cm mass at the 12:00 to 12:30 position of the RIGHT breast. The  RIBBON clip is in satisfactory position corresponding to the mass identified mammographically. IMPRESSION: Satisfactory RIBBON clip position following ultrasound-guided RIGHT breast biopsy. Final Assessment: Post Procedure Mammograms for Marker Placement Electronically Signed   By: Margarette Canada M.D.   On: 02/22/2019 14:18   [place] Rt Breast Bx W Loc Dev 1st Lesion Img Bx Spec US Guide  Addendum Date: 02/23/2019   ADDENDUM REPORT: 02/23/2019 11:57 ADDENDUM: Pathology revealed GRADE 1 INVASIVE DUCTAL CARCINOMA, DUCTAL CARCINOMA IN SITU, of the RIGHT breast, upper inner, 12-12:30 o'clock position. This was found to be concordant by Dr. Hassan Rowan. Pathology results were discussed with the patient by telephone. The patient reported doing well after the biopsy with tenderness at the site. Post biopsy instructions and care were reviewed and questions were answered. The patient was encouraged to call [facility] for any additional concerns. The patient was referred to [facility] on March 03, 2019. Pathology results reported by Stacie Acres, RN on 02/23/2019. Electronically Signed   By: Margarette Canada M.D.   On: 02/23/2019 11:57   Result Date: 02/23/2019 CLINICAL DATA:  58 year old female for tissue sampling of UPPER INNER RIGHT breast mass. EXAM: ULTRASOUND GUIDED RIGHT BREAST CORE NEEDLE BIOPSY COMPARISON:  Previous exam(s). FINDINGS: I met with the patient and we discussed the procedure of ultrasound-guided biopsy, including benefits and alternatives. We discussed the high likelihood of a successful procedure. We discussed the risks of the procedure, including infection, bleeding, tissue injury, clip migration, and inadequate sampling. Informed written consent was given. The usual time-out protocol was performed immediately prior to the procedure. Lesion quadrant: UPPER INNER RIGHT breast Using sterile technique and 1%  Lidocaine as local anesthetic, under direct ultrasound visualization, a 14 gauge spring-loaded device was used to perform biopsy of the 0.7 cm mass at the 12:00 - 12:30 position of the RIGHT breast 5 cm from the nipple using a LATERAL approach. At the conclusion of the procedure a rib tissue marker clip was deployed into the biopsy cavity. Follow up 2 view mammogram was performed and dictated separately. IMPRESSION: Ultrasound guided biopsy of 0.7 cm UPPER INNER RIGHT breast mass. No apparent complications. Electronically Signed: By: Margarette Canada M.D. On: 02/22/2019 14:18     ELIGIBLE FOR AVAILABLE RESEARCH PROTOCOL: no   ASSESSMENT: 58 y.o. [place], [place] woman status post right breast upper inner quadrant biopsy 02/22/2019 for a clinical T1b N0, stage IA base of ductal carcinoma, rate 1, estrogen and progesterone receptor positive, HER-2 not amplified, with an MIB-1 of 1%  (1) definitive surgery pending  (2) chemotherapy not anticipated  (3) adjuvant radiation to follow  (4) antiestrogens to start at the completion of local treatment   PLAN: I spent approximately 60 minutes face to face with Izora Gala with more than 50% of that time spent in counseling and coordination of care. Specifically we reviewed the biology of the patient's diagnosis and the specifics of her situation.  We first reviewed the fact that cancer is not one disease but more than 100 different diseases and that it is important to keep them  separate-- otherwise when friends and relatives discuss their own cancer experiences with Zayla confusion can result. Similarly we explained that if breast cancer spreads to the bone or liver, the patient would not have bone cancer or liver cancer, but breast cancer in the bone and breast cancer in the liver: one cancer in three places-- not 3 different cancers which otherwise would have to be treated in 3 different ways.  We discussed the difference between local and systemic therapy. In terms  of loco-regional treatment, lumpectomy plus radiation is equivalent to mastectomy as far as survival is concerned. For this reason, and because the cosmetic results are generally superior, we recommend breast conserving surgery.   We then discussed the rationale for systemic therapy. There is some risk that this cancer may have already spread to other parts of her body. Patients frequently ask at this point about bone scans, CAT scans and PET scans to find out if they have occult breast cancer somewhere else. The problem is that in early stage disease we are much more likely to find false positives then true cancers and this would expose the patient to unnecessary procedures as well as unnecessary radiation. Scans cannot answer the question the patient really would like to know, which is whether she has microscopic disease elsewhere in her body. For those reasons we do not recommend them.  Of course we would proceed to aggressive evaluation of any symptoms that might suggest metastatic disease, but that is not the case here.  Next we went over the options for systemic therapy which are anti-estrogens, anti-HER-2 immunotherapy, and chemotherapy. Krisandra does not meet criteria for anti-HER-2 immunotherapy. She is a good candidate for anti-estrogens.  The question of chemotherapy is more complicated. Chemotherapy is most effective in rapidly growing, aggressive tumors. It is much less effective in low-grade, slow growing cancers, like [place] 's. For that reason and given the size of the tumor we will forego Oncotype testing and bypass chemotherapy  The plan accordingly is for surgery, radiation, and antiestrogens.    The patient also qualifies for genetics testing. In patients who carry a deleterious mutation [for example in a  BRCA gene], the risk of a new breast cancer developing in the future may be sufficiently great that the patient may choose bilateral mastectomies. However if she wishes to keep her  breasts in that situation it is safe to do so. That would require intensified screening, which generally means not only yearly mammography but a yearly breast MRI as well.   Kam has a good understanding of the overall plan. She agrees with it. She knows the goal of treatment in her case is cure. She will call with any problems that may develop before her next visit here.    Avanna Sowder, Virgie Dad, MD  03/03/19 5:05 PM Medical Oncology and Hematology [facility] [address] [place], [place] [address] Tel. [phone]    Fax. [phone]   I, Jacqualyn Posey am acting as a Education administrator for Chauncey Cruel, MD.   I, Lurline Del MD, have reviewed the above documentation for accuracy and completeness, and I agree with the above.

## 2019-03-03 ENCOUNTER — Encounter: Payer: Self-pay | Admitting: General Practice

## 2019-03-03 ENCOUNTER — Encounter: Payer: Self-pay | Admitting: Physical Therapy

## 2019-03-03 ENCOUNTER — Inpatient Hospital Stay: Payer: BLUE CROSS/BLUE SHIELD

## 2019-03-03 ENCOUNTER — Ambulatory Visit
Admission: RE | Admit: 2019-03-03 | Discharge: 2019-03-03 | Disposition: A | Discharge status: Home / Self Care | Payer: BLUE CROSS/BLUE SHIELD | Source: Ambulatory Visit | Attending: Radiation Oncology | Admitting: Radiation Oncology

## 2019-03-03 ENCOUNTER — Ambulatory Visit: Payer: BLUE CROSS/BLUE SHIELD | Attending: Surgery | Admitting: Physical Therapy

## 2019-03-03 ENCOUNTER — Inpatient Hospital Stay (HOSPITAL_BASED_OUTPATIENT_CLINIC_OR_DEPARTMENT_OTHER): Payer: BLUE CROSS/BLUE SHIELD | Admitting: Oncology

## 2019-03-03 ENCOUNTER — Other Ambulatory Visit: Payer: Self-pay

## 2019-03-03 ENCOUNTER — Other Ambulatory Visit: Payer: Self-pay | Admitting: Surgery

## 2019-03-03 ENCOUNTER — Encounter: Payer: Self-pay | Admitting: Oncology

## 2019-03-03 ENCOUNTER — Telehealth: Payer: Self-pay | Admitting: Oncology

## 2019-03-03 VITALS — BP 151/82 | HR 90 | Temp 98.5°F | Resp 18 | Ht 65.0 in | Wt 158.2 lb

## 2019-03-03 DIAGNOSIS — C50911 Malignant neoplasm of unspecified site of right female breast: Secondary | ICD-10-CM | POA: Diagnosis not present

## 2019-03-03 DIAGNOSIS — Z170 Estrogen receptor positive status [ER+]: Secondary | ICD-10-CM | POA: Diagnosis not present

## 2019-03-03 DIAGNOSIS — R293 Abnormal posture: Secondary | ICD-10-CM | POA: Insufficient documentation

## 2019-03-03 DIAGNOSIS — Z803 Family history of malignant neoplasm of breast: Secondary | ICD-10-CM | POA: Diagnosis not present

## 2019-03-03 DIAGNOSIS — C50211 Malignant neoplasm of upper-inner quadrant of right female breast: Secondary | ICD-10-CM

## 2019-03-03 DIAGNOSIS — Z808 Family history of malignant neoplasm of other organs or systems: Secondary | ICD-10-CM | POA: Diagnosis not present

## 2019-03-03 LAB — CBC WITH DIFFERENTIAL (CANCER CENTER ONLY)
Abs Immature Granulocytes: 0.02 10*3/uL (ref 0.00–0.07)
Basophils Absolute: 0 10*3/uL (ref 0.0–0.1)
Basophils Relative: 1 %
Eosinophils Absolute: 0 10*3/uL (ref 0.0–0.5)
Eosinophils Relative: 1 %
HCT: 47 % — ABNORMAL HIGH (ref 36.0–46.0)
Hemoglobin: 15.6 g/dL — ABNORMAL HIGH (ref 12.0–15.0)
IMMATURE GRANULOCYTES: 0 %
Lymphocytes Relative: 15 %
Lymphs Abs: 0.9 10*3/uL (ref 0.7–4.0)
MCH: 30.8 pg (ref 26.0–34.0)
MCHC: 33.2 g/dL (ref 30.0–36.0)
MCV: 92.9 fL (ref 80.0–100.0)
Monocytes Absolute: 0.3 10*3/uL (ref 0.1–1.0)
Monocytes Relative: 5 %
NEUTROS ABS: 5.1 10*3/uL (ref 1.7–7.7)
NEUTROS PCT: 78 %
Platelet Count: 230 10*3/uL (ref 150–400)
RBC: 5.06 MIL/uL (ref 3.87–5.11)
RDW: 11.9 % (ref 11.5–15.5)
WBC Count: 6.4 10*3/uL (ref 4.0–10.5)
nRBC: 0 % (ref 0.0–0.2)

## 2019-03-03 LAB — CMP (CANCER CENTER ONLY)
ALBUMIN: 4.1 g/dL (ref 3.5–5.0)
ALT: 16 U/L (ref 0–44)
AST: 16 U/L (ref 15–41)
Alkaline Phosphatase: 61 U/L (ref 38–126)
Anion gap: 12 (ref 5–15)
BUN: 15 mg/dL (ref 6–20)
CO2: 24 mmol/L (ref 22–32)
Calcium: 9.1 mg/dL (ref 8.9–10.3)
Chloride: 105 mmol/L (ref 98–111)
Creatinine: 0.83 mg/dL (ref 0.44–1.00)
GFR, Est AFR Am: >60 mL/min (ref >60)
GFR, Estimated: >60 mL/min (ref >60)
Glucose, Bld: 101 mg/dL — ABNORMAL HIGH (ref 70–99)
Potassium: 4.1 mmol/L (ref 3.5–5.1)
Sodium: 141 mmol/L (ref 135–145)
Total Bilirubin: 0.8 mg/dL (ref 0.3–1.2)
Total Protein: 7.1 g/dL (ref 6.5–8.1)

## 2019-03-03 NOTE — Progress Notes (Signed)
[place] Psychosocial Distress Screening Clinical Social Work  Clinical Social Work was referred by distress screening protocol.  The patient scored a 10 on the Psychosocial Distress Thermometer which indicates severe distress. Clinical Social Worker contacted patient by phone to assess for distress and other psychosocial needs. "Im OK, but I have a lot of anxiety any way, have dealt with a lot of tragedy in my family, brother and sister died tragically.  Father died and now brother died last month."  Aware that preexisting anxiety is heightened by current diagnosis, uncertainty about treatment timeline, concerns about COVID and potential delays in surgery.  Talked about ways to increase coping skills including regular exercise, focus on what she can control, connecting w family, focus on gratitude.  Will research available community therapists for referrals and make referral to [employer] program for [employer].   ONCBCN DISTRESS SCREENING 03/03/2019  Screening Type Initial Screening  Distress experienced in past week (1-10) 10  Family Problem type Other (comment)  Emotional problem type Nervousness/Anxiety;Adjusting to illness  Physical Problem type Sleep/insomnia;Loss of appetitie  Physician notified of physical symptoms Yes  Referral to clinical social work Yes  Other cell [phone]    Clinical Social Worker follow up needed: Yes.  Refer for therapy and Al[facility] guide  If yes, follow up plan:  Beverely Pace, [place], LCSW Clinical Social Worker Phone:  [phone]

## 2019-03-03 NOTE — Therapy (Signed)
[place], [place], [address] Phone: [phone]   Fax:  [phone]  Physical Therapy Evaluation  Patient Details  Name: Rhonda [name] MRN: [id number] Date of Birth: December 18, 1960 Referring Provider (PT): Dr. Alphonsa Overall   Encounter Date: 03/03/2019  PT End of Session - 03/03/19 0955    Visit Number  1    Number of Visits  2    Date for PT Re-Evaluation  04/28/19    PT Start Time  0918    PT Stop Time  0938    PT Time Calculation (min)  20 min    Activity Tolerance  Patient tolerated treatment well    Behavior During Therapy  [facility] for tasks assessed/performed       Past Medical History:  Diagnosis Date  . Allergy    seasonal  . Anxiety   . Fibroid   . Glaucoma   . Leg pain, bilateral   . Lung nodules    benign  . Other and unspecified hyperlipidemia 04/05/2013  . Peripheral vascular disease (HCC)    chronic venous insufficiency bilaterally  . PONV (postoperative nausea and vomiting)   . Varicose veins of both lower extremities     Past Surgical History:  Procedure Laterality Date  . BLADDER SUSPENSION  2010   Dr.Silva  . BREAST CYST ASPIRATION Right 09/08/2012  . INSERTION OF MESH N/A 11/01/2016   Procedure: INSERTION OF MESH;  Surgeon: Jackolyn Confer, MD;  Location: [facility];  Service: General;  Laterality: N/A;  . RHINOPLASTY    . TONSILLECTOMY    . varicose veins    . [place]  . VENTRAL HERNIA REPAIR N/A 11/01/2016   Procedure: VENTRAL HERNIA REPAIR WITH MESH;  Surgeon: Jackolyn Confer, MD;  Location: [facility];  Service: General;  Laterality: N/A;    There were no vitals filed for this visit.   Subjective Assessment - 03/03/19 0949    Subjective  Patient reports she is here today to be seen by our medical team for her newly diagnosed right breast cancer.    Patient is accompained by:  Family member    Pertinent History  Patient was diagnosed on 02/23/2019  with right grade I invasive ductal carcinoma breast cancer. It measures 7 mm and is located in the upper iner quadrant. It is ER/PR positive and HER2 negative with a Ki67 of 1%. She is otherwise healthy.    Patient Stated Goals  reduce lymphedema risk and learn post op shoulder ROM HEP    Currently in Pain?  No/denies         [facility] PT Assessment - 03/03/19 0001      Assessment   Medical Diagnosis  Right breast cancer    Referring Provider (PT)  Dr. Alphonsa Overall    Onset Date/Surgical Date  02/23/19    Hand Dominance  Right    Prior Therapy  none      Precautions   Precautions  Other (comment)    Precaution Comments  active cancer      Restrictions   Weight Bearing Restrictions  No      Balance Screen   Has the patient fallen in the past 6 months  No    Has the patient had a decrease in activity level because of a fear of falling?   No    Is the patient reluctant to leave their home because of a fear of falling?   No  Home Environment   Living Environment  Private residence    Living Arrangements  Spouse/significant other;Children   Husband and 60 y.o. son   Available Help at Discharge  Family      Prior Function   Level of [place]  Unemployed    Leisure  She walks 3-4x/week for 1 hour      Cognition   Overall Cognitive Status  Within Functional Limits for tasks assessed      Posture/Postural Control   Posture/Postural Control  Postural limitations    Postural Limitations  Rounded Shoulders;Forward head      ROM / Strength   AROM / PROM / Strength  AROM;Strength      AROM   AROM Assessment Site  Shoulder;Cervical    Right/Left Shoulder  Right;Left    Right Shoulder Extension  59 Degrees    Right Shoulder Flexion  149 Degrees    Right Shoulder ABduction  157 Degrees    Right Shoulder Internal Rotation  50 Degrees    Right Shoulder External Rotation  88 Degrees    Left Shoulder Extension  68 Degrees    Left Shoulder Flexion  147  Degrees    Left Shoulder ABduction  158 Degrees    Left Shoulder Internal Rotation  68 Degrees    Left Shoulder External Rotation  75 Degrees    Cervical Flexion  WNL    Cervical Extension  WNL    Cervical - Right Side Bend  WNL    Cervical - Left Side Bend  WNL    Cervical - Right Rotation  WNL    Cervical - Left Rotation  WNL      Strength   Overall Strength  Within functional limits for tasks performed        LYMPHEDEMA/ONCOLOGY QUESTIONNAIRE - 03/03/19 0953      Type   Cancer Type  Right breast cancer      Lymphedema Assessments   Lymphedema Assessments  Upper extremities      Right Upper Extremity Lymphedema   10 cm Proximal to Olecranon Process  27.2 cm    Olecranon Process  22.7 cm    10 cm Proximal to Ulnar Styloid Process  20.7 cm    Just Proximal to Ulnar Styloid Process  15.5 cm    Across Hand at [employer]  19 cm    At [place] of 2nd Digit  6.3 cm      Left Upper Extremity Lymphedema   10 cm Proximal to Olecranon Process  28.7 cm    Olecranon Process  22.8 cm    10 cm Proximal to Ulnar Styloid Process  20.5 cm    Just Proximal to Ulnar Styloid Process  15.7 cm    Across Hand at [employer]  19.1 cm    At [place] of 2nd Digit  6.2 cm          Quick Dash - 03/03/19 0001    Open a tight or new jar  No difficulty    Do heavy household chores (wash walls, wash floors)  No difficulty    Carry a shopping bag or briefcase  No difficulty    Wash your back  No difficulty    Use a knife to cut food  No difficulty    Recreational activities in which you take some force or impact through your arm, shoulder, or hand (golf, hammering, tennis)  No difficulty    During the  past week, to what extent has your arm, shoulder or hand problem interfered with your normal social activities with family, friends, neighbors, or groups?  Not at all    During the past week, to what extent has your arm, shoulder or hand problem limited your work or other regular daily activities   Not at all    Arm, shoulder, or hand pain.  None    Tingling (pins and needles) in your arm, shoulder, or hand  None    Difficulty Sleeping  No difficulty    DASH Score  0 %        Objective measurements completed on examination: See above findings.     Patient was instructed today in a home exercise program today for post op shoulder range of motion. These included active assist shoulder flexion in sitting, scapular retraction, wall walking with shoulder abduction, and hands behind head external rotation.  She was encouraged to do these twice a day, holding 3 seconds and repeating 5 times when permitted by her physician.      PT Education - 03/03/19 0954    Education Details  Lymphedema risk reduction and post op shoulder ROM HEP    Person(s) Educated  Patient;Spouse    Methods  Explanation;Demonstration;Handout    Comprehension  Returned demonstration;Verbalized understanding          PT Long Term Goals - 03/03/19 1022      PT LONG TERM GOAL #1   Title  Patient will demonstrate she has regained full shoulder ROM and function post operatively compared to baselines.    Time  8    Period  Weeks    Status  New      Breast Clinic Goals - 03/03/19 1022      Patient will be able to verbalize understanding of pertinent lymphedema risk reduction practices relevant to her diagnosis specifically related to skin care.   Time  1    Period  Days    Status  Achieved      Patient will be able to return demonstrate and/or verbalize understanding of the post-op home exercise program related to regaining shoulder range of motion.   Time  1    Period  Days      Patient will be able to verbalize understanding of the importance of attending the postoperative After Breast Cancer Class for further lymphedema risk reduction education and therapeutic exercise.   Time  1    Period  Days    Status  Achieved            Plan - 03/03/19 0956    Clinical Impression Statement  Patient  was diagnosed on 02/23/2019 with right grade I invasive ductal carcinoma breast cancer. It measures 7 mm and is located in the upper iner quadrant. It is ER/PR positive and HER2 negative with a Ki67 of 1%. She is otherwise healthy. Her multidisciplinary medical team met prior to her assessments to determine a recommended treatment plan. She is planning to have a right lumpectomy and sentinel node biopsy followed by possible Oncotype testing, radiation, and anti-estrogen therapy. She will benefit from a post op PT visit to reassess and determine needs.    Stability/Clinical Decision Making  Stable/Uncomplicated    Clinical Decision Making  Low    Rehab Potential  Excellent    PT Frequency  --   Eval and 1 f/u visit   PT Treatment/Interventions  ADLs/Self Care Home Management;Patient/family education;Therapeutic exercise    PT Next Visit  Plan  Will reassess 3-4 weeks post op    PT Home Exercise Plan  Post op shoulder ROM HEP    Consulted and Agree with Plan of Care  Patient;Family member/caregiver    Family Member Consulted  husband       Patient will benefit from skilled therapeutic intervention in order to improve the following deficits and impairments:  Decreased range of motion, Impaired UE functional use, Pain, Decreased knowledge of precautions, Postural dysfunction  Visit Diagnosis: Malignant neoplasm of upper-inner quadrant of right breast in female, estrogen receptor positive ([place]) - Plan: PT plan of care cert/re-cert  Abnormal posture - Plan: PT plan of care cert/re-cert   Patient will follow up at outpatient cancer rehab 3-4 weeks following surgery.  If the patient requires physical therapy at that time, a specific plan will be dictated and sent to the referring physician for approval. The patient was educated today on appropriate basic range of motion exercises to begin post operatively and the importance of attending the After Breast Cancer class following surgery.  Patient was  educated today on lymphedema risk reduction practices as it pertains to recommendations that will benefit the patient immediately following surgery.  She verbalized good understanding.      Problem List Patient Active Problem List   Diagnosis Date Noted  . Malignant neoplasm of upper-inner quadrant of right breast in female, estrogen receptor positive ([place]) 03/01/2019  . DUB (dysfunctional uterine bleeding) 09/21/2013  . Anxiety   . Other and unspecified hyperlipidemia 04/05/2013   Annia Friendly, PT 03/03/19 10:42 AM  [place], [place], [address] Phone: [phone]   Fax:  [phone]  Name: Rhonda [name] MRN: [id number] Date of Birth: [birth year]

## 2019-03-03 NOTE — Progress Notes (Signed)
Radiation Oncology         ([phone]) [phone] ________________________________  Multidisciplinary Breast Oncology Clinic [facility]) Initial Outpatient Consultation  Name: Rhonda [name] MRN: [id number]  Date: 03/03/2019  DOB: [birth year]  GL:OVFIEPPIRJ, Altamese Cabal, MD  Rozetta Nunnery, *   REFERRING PHYSICIAN: Rozetta Nunnery, *  DIAGNOSIS: The encounter diagnosis was Malignant neoplasm of upper-inner quadrant of right breast in female, estrogen receptor positive ([place]).  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 Malignant neoplasm of upper-inner quadrant of right breast in female, estrogen receptor positive ([place]) C50.211    Z17.0     HISTORY OF PRESENT ILLNESS::Rhonda [name] is a 58 y.o. female who is presenting to the office today for evaluation of her newly diagnosed breast cancer. She is accompanied by her husband. She is doing well overall.   She had routine screening mammography on 02/12/19 showing a possible abnormality in the right breast. She underwent bilateral diagnostic mammography with tomography and right breast ultrasonography at [facility] on 02/17/19 showing: a suspicious mass in the right breast at 12 o'clock. No evidence of right axillary lymphadenopathy.  Biopsy of the right breast on 02/22/19 showed: invasive ductal carcinoma at the 12-12:30 o'clock position (appearing grade 1) as well as DCIS. Prognostic indicators significant for: estrogen receptor, 100% positive and progesterone receptor, 100% positive, both with strong staining intensity. Proliferation marker Ki67 at 1%. HER2 negative.  Menarche: 58 years old Age at first live birth: 58 years old GP: GxP2 LMP: 2017 Contraceptive: briefly in 1986, reports they made her sick HRT: No   The patient was referred today for presentation in the multidisciplinary conference.  Radiology studies and pathology slides were presented there for  review and discussion of treatment options.  A consensus was discussed regarding potential next steps.  PREVIOUS RADIATION THERAPY: No  PAST MEDICAL HISTORY:  has a past medical history of Allergy, Anxiety, Fibroid, Glaucoma, Leg pain, bilateral, Lung nodules, Other and unspecified hyperlipidemia (04/05/2013), Peripheral vascular disease ([place]), PONV (postoperative nausea and vomiting), and Varicose veins of both lower extremities.    PAST SURGICAL HISTORY: Past Surgical History:  Procedure Laterality Date   BLADDER SUSPENSION  2010   Dr.Silva   BREAST CYST ASPIRATION Right 09/08/2012   INSERTION OF MESH N/A 11/01/2016   Procedure: INSERTION OF MESH;  Surgeon: Jackolyn Confer, MD;  Location: [facility];  Service: General;  Laterality: N/A;   RHINOPLASTY     TONSILLECTOMY     varicose veins     VEIN LIGATION AND STRIPPING  1995   VENTRAL HERNIA REPAIR N/A 11/01/2016   Procedure: VENTRAL HERNIA REPAIR WITH MESH;  Surgeon: Jackolyn Confer, MD;  Location: [place];  Service: General;  Laterality: N/A;    FAMILY HISTORY: family history includes Alcohol abuse in her brother; Breast cancer in her maternal grandmother; Cancer in her maternal grandmother and mother; Heart disease in her brother, father, and maternal grandfather; Liver disease in her brother; Prostate cancer in her brother and father; Stroke in her brother; Tracheal cancer in her mother; Varicose Veins in her brother, father, and paternal grandmother.  SOCIAL HISTORY:  reports that she has never smoked. She has never used smokeless tobacco. She reports current alcohol use of about 7.0 standard drinks of alcohol per week. She reports that she does not use drugs.  ALLERGIES: Diflucan [fluconazole]  MEDICATIONS:  Current Outpatient Medications  Medication Sig  Dispense Refill   ACZONE 7.5 % GEL      Ascorbic Acid (VITAMIN C) 1000 MG tablet Take 1,000 mg by mouth daily.     lactobacillus acidophilus (BACID) TABS tablet Take 1  tablet by mouth 3 (three) times a week. Reported on 12/12/2015     Magnesium 300 MG CAPS Take 1 capsule by mouth daily.     multivitamin-lutein (OCUVITE-LUTEIN) CAPS Take 1 capsule by mouth daily.     OVER THE COUNTER MEDICATION Vitamin D 3 2000 iu taking in the a.m.     OVER THE COUNTER MEDICATION Flaxseed oil 1000 mg taking daily     OVER THE COUNTER MEDICATION Epax fish oil Omega 3 taking daily     OVER THE COUNTER MEDICATION L-thiamine for anxiety     TRAVATAN Z 0.004 % SOLN ophthalmic solution Place 1 drop into both eyes at bedtime.     No current facility-administered medications for this encounter.     REVIEW OF SYSTEMS:  REVIEW OF SYSTEMS: A 10+ POINT REVIEW OF SYSTEMS WAS OBTAINED including neurology, dermatology, psychiatry, cardiac, respiratory, lymph, extremities, GI, GU, musculoskeletal, constitutional, reproductive, HEENT.On the provided form, she reports wearing glasses and having anxiety. She denies any other symptoms.    PHYSICAL EXAM:  Vitals with BMI 03/03/2019  Height '5\' 5"'$   Weight 158 lbs 3 oz  BMI 43.32  Systolic 951  Diastolic 82  Pulse 90  Respirations 18  Lungs are clear to auscultation bilaterally. Heart has regular rate and rhythm. No palpable cervical, supraclavicular, or axillary adenopathy. Abdomen soft, non-tender, normal bowel sounds. Breast: Left breast with no palpable mass, nipple discharge, or bleeding. Right breast with steri-strips in place in the UOQ. Some mild bruising in the breast area. No dominant mass, nipple discharge or bleeding.    ECOG = 1  0 - Asymptomatic (Fully active, able to carry on all predisease activities without restriction)  1 - Symptomatic but completely ambulatory (Restricted in physically strenuous activity but ambulatory and able to carry out work of a light or sedentary nature. For example, light housework, office work)  2 - Symptomatic, <50% in bed during the day (Ambulatory and capable of all self care but unable  to carry out any work activities. Up and about more than 50% of waking hours)  3 - Symptomatic, >50% in bed, but not bedbound (Capable of only limited self-care, confined to bed or chair 50% or more of waking hours)  4 - Bedbound (Completely disabled. Cannot carry on any self-care. Totally confined to bed or chair)  5 - Death   Eustace Pen MM, Creech RH, Tormey DC, et al. [phone]). "Toxicity and response criteria of the [facility]". [place] Oncol. 5 (6): 649-55  LABORATORY DATA:  Lab Results  Component Value Date   WBC 6.4 03/03/2019   HGB 15.6 (H) 03/03/2019   HCT 47.0 (H) 03/03/2019   MCV 92.9 03/03/2019   PLT 230 03/03/2019   Lab Results  Component Value Date   NA 141 03/03/2019   K 4.1 03/03/2019   CL 105 03/03/2019   CO2 24 03/03/2019   Lab Results  Component Value Date   ALT 16 03/03/2019   AST 16 03/03/2019   ALKPHOS 61 03/03/2019   BILITOT 0.8 03/03/2019    PULMONARY FUNCTION TEST:   Recent Review Flowsheet Data    There is no flowsheet data to display.      RADIOGRAPHY: US Breast Ltd Uni Right Inc Axilla  Result Date:  02/17/2019 CLINICAL DATA:  Screening recall for a possible right breast mass. EXAM: DIGITAL DIAGNOSTIC RIGHT MAMMOGRAM WITH CAD AND TOMO ULTRASOUND RIGHT BREAST COMPARISON:  Previous exam(s). ACR Breast Density Category c: The breast tissue is heterogeneously dense, which may obscure small masses. FINDINGS: Spot compression tomosynthesis images of the superior right breast demonstrates a persistent irregular mass measuring approximately 8 mm. Mammographic images were processed with CAD. Ultrasound of the right breast at 12 o'clock, 5 cm from the nipple demonstrates an irregular hypoechoic mass with indistinct margins measuring 7 x 5 x 7 mm. No blood flow seen within the mass on color Doppler imaging. Ultrasound of the right axilla demonstrates multiple normal-appearing nodes. IMPRESSION: 1.  There is a suspicious mass in the right  breast at 12 o'clock. 2.  No evidence of right axillary lymphadenopathy. RECOMMENDATION: Ultrasound guided biopsy is recommended for the right breast mass. This has been scheduled for 02/22/2019 at 1:45 p.m. I have discussed the findings and recommendations with the patient. Results were also provided in writing at the conclusion of the visit. If applicable, a reminder letter will be sent to the patient regarding the next appointment. BI-RADS CATEGORY  4: Suspicious. Electronically Signed   By: Ammie Ferrier M.D.   On: 02/17/2019 15:00   Mm Diag Breast Tomo Uni Right  Result Date: 02/17/2019 CLINICAL DATA:  Screening recall for a possible right breast mass. EXAM: DIGITAL DIAGNOSTIC RIGHT MAMMOGRAM WITH CAD AND TOMO ULTRASOUND RIGHT BREAST COMPARISON:  Previous exam(s). ACR Breast Density Category c: The breast tissue is heterogeneously dense, which may obscure small masses. FINDINGS: Spot compression tomosynthesis images of the superior right breast demonstrates a persistent irregular mass measuring approximately 8 mm. Mammographic images were processed with CAD. Ultrasound of the right breast at 12 o'clock, 5 cm from the nipple demonstrates an irregular hypoechoic mass with indistinct margins measuring 7 x 5 x 7 mm. No blood flow seen within the mass on color Doppler imaging. Ultrasound of the right axilla demonstrates multiple normal-appearing nodes. IMPRESSION: 1.  There is a suspicious mass in the right breast at 12 o'clock. 2.  No evidence of right axillary lymphadenopathy. RECOMMENDATION: Ultrasound guided biopsy is recommended for the right breast mass. This has been scheduled for 02/22/2019 at 1:45 p.m. I have discussed the findings and recommendations with the patient. Results were also provided in writing at the conclusion of the visit. If applicable, a reminder letter will be sent to the patient regarding the next appointment. BI-RADS CATEGORY  4: Suspicious. Electronically Signed   By: Ammie Ferrier M.D.   On: 02/17/2019 15:00   Mm 3d Screen Breast Bilateral  Result Date: 02/15/2019 CLINICAL DATA:  Screening. EXAM: DIGITAL SCREENING BILATERAL MAMMOGRAM WITH TOMO AND CAD COMPARISON:  Previous exam(s). ACR Breast Density Category c: The breast tissue is heterogeneously dense, which may obscure small masses. FINDINGS: In the right breast, a possible mass warrants further evaluation. In the left breast, no findings suspicious for malignancy. Images were processed with CAD. IMPRESSION: Further evaluation is suggested for possible mass in the right breast. RECOMMENDATION: Diagnostic mammogram and possibly ultrasound of the right breast. (Code:[id number]) The patient will be contacted regarding the findings, and additional imaging will be scheduled. BI-RADS CATEGORY  0: Incomplete. Need additional imaging evaluation and/or prior mammograms for comparison. Electronically Signed   By: Abelardo Diesel M.D.   On: 02/15/2019 08:20   Mm Clip Placement Right  Result Date: 02/22/2019 CLINICAL DATA:  Evaluate clip placement following ultrasound-guided RIGHT breast biopsy.  EXAM: DIAGNOSTIC RIGHT MAMMOGRAM POST ULTRASOUND BIOPSY COMPARISON:  Previous exam(s). FINDINGS: Mammographic images were obtained following ultrasound guided biopsy of the 0.7 cm mass at the 12:00 to 12:30 position of the RIGHT breast. The RIBBON clip is in satisfactory position corresponding to the mass identified mammographically. IMPRESSION: Satisfactory RIBBON clip position following ultrasound-guided RIGHT breast biopsy. Final Assessment: Post Procedure Mammograms for Marker Placement Electronically Signed   By: Margarette Canada M.D.   On: 02/22/2019 14:18   [place] Rt Breast Bx W Loc Dev 1st Lesion Img Bx Spec US Guide  Addendum Date: 02/23/2019   ADDENDUM REPORT: 02/23/2019 11:57 ADDENDUM: Pathology revealed GRADE 1 INVASIVE DUCTAL CARCINOMA, DUCTAL CARCINOMA IN SITU, of the RIGHT breast, upper inner, 12-12:30 o'clock position. This was found to  be concordant by Dr. Hassan Rowan. Pathology results were discussed with the patient by telephone. The patient reported doing well after the biopsy with tenderness at the site. Post biopsy instructions and care were reviewed and questions were answered. The patient was encouraged to call [facility] for any additional concerns. The patient was referred to [facility] on March 03, 2019. Pathology results reported by Stacie Acres, RN on 02/23/2019. Electronically Signed   By: Margarette Canada M.D.   On: 02/23/2019 11:57   Result Date: 02/23/2019 CLINICAL DATA:  58 year old female for tissue sampling of UPPER INNER RIGHT breast mass. EXAM: ULTRASOUND GUIDED RIGHT BREAST CORE NEEDLE BIOPSY COMPARISON:  Previous exam(s). FINDINGS: I met with the patient and we discussed the procedure of ultrasound-guided biopsy, including benefits and alternatives. We discussed the high likelihood of a successful procedure. We discussed the risks of the procedure, including infection, bleeding, tissue injury, clip migration, and inadequate sampling. Informed written consent was given. The usual time-out protocol was performed immediately prior to the procedure. Lesion quadrant: UPPER INNER RIGHT breast Using sterile technique and 1% Lidocaine as local anesthetic, under direct ultrasound visualization, a 14 gauge spring-loaded device was used to perform biopsy of the 0.7 cm mass at the 12:00 - 12:30 position of the RIGHT breast 5 cm from the nipple using a LATERAL approach. At the conclusion of the procedure a rib tissue marker clip was deployed into the biopsy cavity. Follow up 2 view mammogram was performed and dictated separately. IMPRESSION: Ultrasound guided biopsy of 0.7 cm UPPER INNER RIGHT breast mass. No apparent complications. Electronically Signed: By: Margarette Canada M.D. On: 02/22/2019 14:18      IMPRESSION: Clinical Stage IA Invasive  ductal carcinoma of the right breast   Patient will be an excellent candidate for breast conservation with radiotherapy to right breast. We briefly discussed mastectomy as part of her management but she is very interested in breast conservation surgery. We discussed  the general course of radiation, potential side effects, and toxicities with radiation and the patient is interested in this approach.    PLAN:  1. Right Lumpectomy with SLN 2. Genetics testing 3. Adjuvant radiation 4. Oncotype to determine the potential benefit of adjuvant chemotherapy 5. AI   ------------------------------------------------  Blair Promise, PhD, MD This document serves as a record of services personally performed by Gery Pray, MD. It was created on his behalf by Mary-Margaret Loma Messing, a trained medical scribe. The creation of this record is based on the scribe's personal observations and the provider's statements to them. This document has been checked and approved by the attending provider.

## 2019-03-03 NOTE — Telephone Encounter (Signed)
Called regarding 6/22

## 2019-03-03 NOTE — Progress Notes (Signed)
[facility] CSW Progress Notes  Referral made for [facility] guide.  Edwyna Shell, LCSW Clinical Social Worker Phone:  [phone]

## 2019-03-03 NOTE — Progress Notes (Unsigned)
Nutrition Assessment  Reason for Assessment:  Pt seen in Breast Clinic  ASSESSMENT:  58 year old female with new diagnosis of breast cancer.  Past medical history reviewed.    Patient reports decrease in appetite after receiving news of breast cancer diagnosis.  Medications:  reviewed  Labs: reviewed  Anthropometrics:   Height: 65 inches Weight: 158 lb 3.2 oz BMI: 26   NUTRITION DIAGNOSIS: Food and nutrition related knowledge deficit related to new diagnosis of breast cancer as evidenced by no prior need for nutrition related information.  INTERVENTION:   Discussed and provided packet of information regarding nutritional tips for breast cancer patients.  Questions answered.  Teachback method used.  Contact information provided and patient knows to contact me with questions/concerns.    MONITORING, EVALUATION, and GOAL: Pt will consume a healthy plant based diet to maintain lean body mass throughout treatment.   Bellany Elbaum B. Zenia Resides, [place], [place] Registered Dietitian [phone] (pager)

## 2019-03-03 NOTE — Patient Instructions (Signed)

## 2019-03-04 ENCOUNTER — Other Ambulatory Visit: Payer: Self-pay | Admitting: Surgery

## 2019-03-04 ENCOUNTER — Other Ambulatory Visit: Payer: Self-pay | Admitting: *Deleted

## 2019-03-04 DIAGNOSIS — Z170 Estrogen receptor positive status [ER+]: Principal | ICD-10-CM

## 2019-03-04 DIAGNOSIS — F411 Generalized anxiety disorder: Secondary | ICD-10-CM

## 2019-03-04 DIAGNOSIS — C50911 Malignant neoplasm of unspecified site of right female breast: Secondary | ICD-10-CM

## 2019-03-08 ENCOUNTER — Other Ambulatory Visit: Payer: Self-pay | Admitting: *Deleted

## 2019-03-08 DIAGNOSIS — C50211 Malignant neoplasm of upper-inner quadrant of right female breast: Secondary | ICD-10-CM

## 2019-03-08 DIAGNOSIS — Z170 Estrogen receptor positive status [ER+]: Principal | ICD-10-CM

## 2019-03-11 ENCOUNTER — Encounter: Payer: Self-pay | Admitting: Genetic Counselor

## 2019-03-12 ENCOUNTER — Other Ambulatory Visit: Payer: Self-pay

## 2019-03-12 ENCOUNTER — Encounter (HOSPITAL_BASED_OUTPATIENT_CLINIC_OR_DEPARTMENT_OTHER): Payer: Self-pay | Admitting: *Deleted

## 2019-03-17 ENCOUNTER — Other Ambulatory Visit: Payer: BLUE CROSS/BLUE SHIELD

## 2019-03-19 ENCOUNTER — Other Ambulatory Visit: Payer: Self-pay

## 2019-03-19 ENCOUNTER — Ambulatory Visit
Admission: RE | Admit: 2019-03-19 | Discharge: 2019-03-19 | Disposition: A | Discharge status: Home / Self Care | Payer: BLUE CROSS/BLUE SHIELD | Source: Ambulatory Visit | Attending: Surgery | Admitting: Surgery

## 2019-03-19 DIAGNOSIS — Z170 Estrogen receptor positive status [ER+]: Principal | ICD-10-CM

## 2019-03-19 DIAGNOSIS — C50911 Malignant neoplasm of unspecified site of right female breast: Secondary | ICD-10-CM | POA: Diagnosis not present

## 2019-03-19 HISTORY — PX: BREAST LUMPECTOMY: SHX2

## 2019-03-19 NOTE — Progress Notes (Signed)
Ensure Pre-Surgery drink given to patient with instructions to complete by 0415 DOS.  Surgical scrub also given to patient with instructions for use.  Patient verbalized understanding of instructions.

## 2019-03-19 NOTE — H&P (Signed)
Rhonda [name]  Location: [facility] Patient #: [id number] DOB: 1961-01-06 Married / Language: English / Race: White Female  History of Present Illness   The patient is a 58 year old female who presents with a complaint of breast cancer.  The PCP is Dr. Coralyn Mark   The pateint was seen at the [facility] - Oncology is Drs. Magrinat and Kinard [Corona virus days] She is with her husband, Rhonda [name].  she has gotten annual mammograms. She's had no prior breast biopsies or breast problems. She felt nothing in her breast. She went through menopause about 2 years ago. She is not on hormone replacement.  [facility] - She had a mammogram on 02/17/2019 which shows a suspicious mass at 12:00 o'clock which is 7 x 5 mm, no right axillary adenopathy Biopsy: Right breast biopsy, 12:30 o'clock, on 02/22/2019 ([id number]) - IDC grade 1, ER - 100%, PR -100%, Ki67 -1%, Her2neu - negative Family history of breast or ovarian cancer: for genetic testing ??? On hormone therapy: No  I discussed the options for breast cancer treatment with the patient. The patient is at the [facility], which includes medical oncology and radiation oncology. I discussed the surgical options of lumpectomy vs. mastectomy. If mastectomy, there is the possibility of reconstruction. I discussed the options of lymph node biopsy. The treatment plan depends on the pathologic staging of the tumor and the patient's personal wishes. The risks of surgery include, but are not limited to, bleeding, infection, the need for further surgery, and nerve injury. The patient has been given literature on the treatment of breast cancer.  Plan: 1) right breast lumpectomy (seed localization) and right axillary sentinely lymph node biopsy, 2) Rad tx, 3) Antihormone tx  Past Medical History: 1. Sees [facility] for GYN 2. Ventral hernia repair, open -  11/01/2016 - [facility] 3. Urethral sling - 05/30/2009 - [facility] 4. Anxiety 5. Hypercholesterolemia 6. Colonoscopy by Dr. Darrel Hoover - 2018 7. History of vein stripping x 2 - last at Dr. Aleda Grana in 2019  Social History: Married, husband Rhonda [name] with her. She has two sons - Rhonda [name], 74 yo, in [place], and Rhonda [name], 60 yo, Archivist. She is a retired Copywriter, advertising.  Past Surgical History Tawni Pummel, RN; 03/03/2019 7:32 AM) Tonsillectomy  Ventral / Umbilical Hernia Surgery  Bilateral.  Diagnostic Studies History Tawni Pummel, RN; 03/03/2019 7:32 AM) Colonoscopy  within last year never Mammogram  within last year Pap Smear  1-5 years ago  Medication History Tawni Pummel, RN; 03/03/2019 7:32 AM) Medications Reconciled  Social History Tawni Pummel, RN; 03/03/2019 7:32 AM) Alcohol use  Moderate alcohol use, Occasional alcohol use. Caffeine use  Coffee, Tea. No drug use  Tobacco use  Never smoker.  Family History Tawni Pummel, RN; 03/03/2019 7:32 AM) Alcohol Abuse  Brother, Father. Breast Cancer  Family Members In General. Depression  Father, Mother. Hypertension  Brother. Prostate Cancer  Brother, Father. Respiratory Condition  Mother. Seizure disorder  Mother. Thyroid problems  Mother, Sister.  Pregnancy / Birth History Tawni Pummel, RN; 03/03/2019 7:32 AM) Age at menarche  49 years. Age of menopause  33-60 Contraceptive History  Oral contraceptives. Gravida  3 Maternal age  24-30 Para  2  Other Problems Tawni Pummel, RN; 03/03/2019 7:32 AM) Anxiety Disorder  Bladder Problems  General anesthesia - complications  Vascular Disease  Ventral Hernia Repair     Review of Systems Sunday Spillers Ledford RN; 03/03/2019 7:32 AM) General Not Present- Appetite Loss, Chills, Fatigue, Fever,  Night Sweats, Weight Gain and Weight Loss. Skin Not Present- Change in Wart/Mole, Dryness, Hives, Jaundice, New Lesions, Non-Healing Wounds, Rash  and Ulcer. HEENT Present- Seasonal Allergies. Not Present- Earache, Hearing Loss, Hoarseness, Nose Bleed, Oral Ulcers, Ringing in the Ears, Sinus Pain, Sore Throat, Visual Disturbances, Wears glasses/contact lenses and Yellow Eyes. Respiratory Not Present- Bloody sputum, Chronic Cough, Difficulty Breathing, Snoring and Wheezing. Breast Not Present- Breast Mass, Breast Pain, Nipple Discharge and Skin Changes. Cardiovascular Not Present- Chest Pain, Difficulty Breathing Lying Down, Leg Cramps, Palpitations, Rapid Heart Rate, Shortness of Breath and Swelling of Extremities. Gastrointestinal Not Present- Abdominal Pain, Bloating, Bloody Stool, Change in Bowel Habits, Chronic diarrhea, Constipation, Difficulty Swallowing, Excessive gas, Gets full quickly at meals, Hemorrhoids, Indigestion, Nausea, Rectal Pain and Vomiting. Female Genitourinary Present- Nocturia. Not Present- Frequency, Painful Urination, Pelvic Pain and Urgency. Musculoskeletal Not Present- Back Pain, Joint Pain, Joint Stiffness, Muscle Pain, Muscle Weakness and Swelling of Extremities. Neurological Not Present- Decreased Memory, Fainting, Headaches, Numbness, Seizures, Tingling, Tremor, Trouble walking and Weakness. Psychiatric Present- Anxiety. Not Present- Bipolar, Change in Sleep Pattern, Depression, Fearful and Frequent crying. Endocrine Present- Hot flashes. Not Present- Cold Intolerance, Excessive Hunger, Hair Changes, Heat Intolerance and New Diabetes. Hematology Not Present- Blood Thinners, Easy Bruising, Excessive bleeding, Gland problems, HIV and Persistent Infections.   Physical Exam  General: WN WF who isalert and generally healthy appearing. She is nervous. Skin: Inspection and palpation of the skin unremarkable.  Eyes: Conjunctivae white, pupils equal. Face, ears, nose, mouth, and throat: Face - normal. Normal ears and nose. Lips and teeth normal.  Neck: Supple. No mass. Trachea midline. No thyroid mass.  Lymph  Nodes: No supraclavicular or cervical adenopathy. No axillary adenopathy.  Lungs: Normal respiratory effort. Clear to auscultation and symmetric breath sounds. Cardiovascular: Regular rate and rythm. Normal auscultation of the heart. No murmur or rub.  Breasts: Right - Some bruising in the inferior breast, but I am not sure that I feel a mass Left - No mass or nodule  Abdomen: Soft. No mass. Liver and spleen not palpable. No tenderness. No hernia. Normal bowel sounds. No abdominal scars. Rectal: Not done.  Musculoskeletal/extremities: Normal gait. Good strength and ROM in upper and lower extremities.   Neurologic: Grossly intact to motor and sensory function.   Psychiatric: Has normal mood and affect. Judgement and insight appear normal.   Assessment & Plan  1.  MALIGNANT NEOPLASM OF RIGHT BREAST, STAGE 1, ESTROGEN RECEPTOR POSITIVE (C50.911)  Story: Biopsy: Right breast biopsy, 12:30 o'clock ([id number]) - IDC grade 1, ER - 100%, PR -100%, Ki67 -1%, Her2neu - negative  Oncology - Magrinat and Kinard  Plan:   1) Right breast lumpectomy (seed localization) and right axillary sentinely lymph node biopsy,    2) Rad tx,   3) Antihormone tx  2. Ventral hernia repair, open - 11/01/2016 - [facility] 3. Urethral sling - 05/30/2009 - [facility] 4. Anxiety 5. Hypercholesterolemia  Alphonsa Overall, MD, [facility] Pager: [phone] Office phone:  [phone]

## 2019-03-22 ENCOUNTER — Encounter (HOSPITAL_BASED_OUTPATIENT_CLINIC_OR_DEPARTMENT_OTHER)
Admission: RE | Disposition: A | Discharge status: Home / Self Care | Payer: Self-pay | Source: Home / Self Care | Attending: Surgery

## 2019-03-22 ENCOUNTER — Ambulatory Visit (HOSPITAL_BASED_OUTPATIENT_CLINIC_OR_DEPARTMENT_OTHER): Payer: BLUE CROSS/BLUE SHIELD | Admitting: Anesthesiology

## 2019-03-22 ENCOUNTER — Encounter (HOSPITAL_BASED_OUTPATIENT_CLINIC_OR_DEPARTMENT_OTHER): Payer: Self-pay | Admitting: *Deleted

## 2019-03-22 ENCOUNTER — Other Ambulatory Visit: Payer: Self-pay

## 2019-03-22 ENCOUNTER — Ambulatory Visit (HOSPITAL_BASED_OUTPATIENT_CLINIC_OR_DEPARTMENT_OTHER)
Admission: RE | Admit: 2019-03-22 | Discharge: 2019-03-22 | Disposition: A | Discharge status: Home / Self Care | Payer: BLUE CROSS/BLUE SHIELD | Attending: Surgery | Admitting: Surgery

## 2019-03-22 ENCOUNTER — Ambulatory Visit
Admission: RE | Admit: 2019-03-22 | Discharge: 2019-03-22 | Disposition: A | Discharge status: Home / Self Care | Payer: BLUE CROSS/BLUE SHIELD | Source: Ambulatory Visit | Attending: Surgery | Admitting: Surgery

## 2019-03-22 ENCOUNTER — Ambulatory Visit (HOSPITAL_COMMUNITY)
Admission: RE | Admit: 2019-03-22 | Discharge: 2019-03-22 | Disposition: A | Discharge status: Home / Self Care | Payer: BLUE CROSS/BLUE SHIELD | Source: Ambulatory Visit | Attending: Surgery | Admitting: Surgery

## 2019-03-22 DIAGNOSIS — Z820 Family history of epilepsy and other diseases of the nervous system: Secondary | ICD-10-CM | POA: Diagnosis not present

## 2019-03-22 DIAGNOSIS — Z170 Estrogen receptor positive status [ER+]: Principal | ICD-10-CM

## 2019-03-22 DIAGNOSIS — Z8042 Family history of malignant neoplasm of prostate: Secondary | ICD-10-CM | POA: Diagnosis not present

## 2019-03-22 DIAGNOSIS — Z811 Family history of alcohol abuse and dependence: Secondary | ICD-10-CM | POA: Insufficient documentation

## 2019-03-22 DIAGNOSIS — D0511 Intraductal carcinoma in situ of right breast: Secondary | ICD-10-CM | POA: Diagnosis not present

## 2019-03-22 DIAGNOSIS — F419 Anxiety disorder, unspecified: Secondary | ICD-10-CM | POA: Diagnosis not present

## 2019-03-22 DIAGNOSIS — C50411 Malignant neoplasm of upper-outer quadrant of right female breast: Secondary | ICD-10-CM | POA: Diagnosis not present

## 2019-03-22 DIAGNOSIS — C50911 Malignant neoplasm of unspecified site of right female breast: Secondary | ICD-10-CM

## 2019-03-22 DIAGNOSIS — Z8249 Family history of ischemic heart disease and other diseases of the circulatory system: Secondary | ICD-10-CM | POA: Insufficient documentation

## 2019-03-22 DIAGNOSIS — Z803 Family history of malignant neoplasm of breast: Secondary | ICD-10-CM | POA: Insufficient documentation

## 2019-03-22 DIAGNOSIS — Z8349 Family history of other endocrine, nutritional and metabolic diseases: Secondary | ICD-10-CM | POA: Diagnosis not present

## 2019-03-22 DIAGNOSIS — E7800 Pure hypercholesterolemia, unspecified: Secondary | ICD-10-CM | POA: Insufficient documentation

## 2019-03-22 DIAGNOSIS — I739 Peripheral vascular disease, unspecified: Secondary | ICD-10-CM | POA: Diagnosis not present

## 2019-03-22 DIAGNOSIS — C50211 Malignant neoplasm of upper-inner quadrant of right female breast: Secondary | ICD-10-CM | POA: Diagnosis not present

## 2019-03-22 DIAGNOSIS — G8918 Other acute postprocedural pain: Secondary | ICD-10-CM | POA: Diagnosis not present

## 2019-03-22 HISTORY — PX: BREAST LUMPECTOMY WITH RADIOACTIVE SEED AND SENTINEL LYMPH NODE BIOPSY: SHX6550

## 2019-03-22 HISTORY — DX: Malignant (primary) neoplasm, unspecified: C80.1

## 2019-03-22 SURGERY — BREAST LUMPECTOMY WITH RADIOACTIVE SEED AND SENTINEL LYMPH NODE BIOPSY
Anesthesia: General | Site: Breast | Laterality: Right

## 2019-03-22 MED ORDER — ACETAMINOPHEN 500 MG PO TABS
1000.0000 mg | ORAL_TABLET | ORAL | Status: AC
  Administered 2019-03-22: 07:00:00 1000 mg via ORAL

## 2019-03-22 MED ORDER — ROPIVACAINE HCL 5 MG/ML IJ SOLN
INTRAMUSCULAR | Status: DC | PRN
  Administered 2019-03-22: 30 mL via PERINEURAL

## 2019-03-22 MED ORDER — ACETAMINOPHEN 500 MG PO TABS
ORAL_TABLET | ORAL | Status: AC
  Filled 2019-03-22: qty 2

## 2019-03-22 MED ORDER — DEXAMETHASONE SODIUM PHOSPHATE 4 MG/ML IJ SOLN
INTRAMUSCULAR | Status: DC | PRN
  Administered 2019-03-22: 10 mg via INTRAVENOUS

## 2019-03-22 MED ORDER — BUPIVACAINE HCL (PF) 0.25 % IJ SOLN
INTRAMUSCULAR | Status: AC
  Filled 2019-03-22: qty 90

## 2019-03-22 MED ORDER — PROPOFOL 10 MG/ML IV BOLUS
INTRAVENOUS | Status: DC | PRN
  Administered 2019-03-22: 200 mg via INTRAVENOUS

## 2019-03-22 MED ORDER — FENTANYL CITRATE (PF) 100 MCG/2ML IJ SOLN
INTRAMUSCULAR | Status: AC
  Filled 2019-03-22: qty 2

## 2019-03-22 MED ORDER — ONDANSETRON HCL 4 MG/2ML IJ SOLN
INTRAMUSCULAR | Status: AC
  Filled 2019-03-22: qty 2

## 2019-03-22 MED ORDER — 0.9 % SODIUM CHLORIDE (POUR BTL) OPTIME
TOPICAL | Status: DC | PRN
  Administered 2019-03-22: 500 mL

## 2019-03-22 MED ORDER — MIDAZOLAM HCL 2 MG/2ML IJ SOLN
INTRAMUSCULAR | Status: AC
  Filled 2019-03-22: qty 2

## 2019-03-22 MED ORDER — OXYCODONE HCL 5 MG/5ML PO SOLN: 5.0000 mg | Freq: Once | ORAL | Status: DC | PRN

## 2019-03-22 MED ORDER — LACTATED RINGERS IV SOLN
INTRAVENOUS | Status: DC
  Administered 2019-03-22: 07:00:00 via INTRAVENOUS

## 2019-03-22 MED ORDER — DEXAMETHASONE SODIUM PHOSPHATE 10 MG/ML IJ SOLN
INTRAMUSCULAR | Status: AC
  Filled 2019-03-22: qty 1

## 2019-03-22 MED ORDER — CEFAZOLIN SODIUM-DEXTROSE 2-4 GM/100ML-% IV SOLN
2.0000 g | INTRAVENOUS | Status: AC
  Administered 2019-03-22: 08:00:00 2 g via INTRAVENOUS

## 2019-03-22 MED ORDER — METHYLENE BLUE 0.5 % INJ SOLN
INTRAVENOUS | Status: AC
  Filled 2019-03-22: qty 20

## 2019-03-22 MED ORDER — PROMETHAZINE HCL 25 MG/ML IJ SOLN: 6.2500 mg | INTRAMUSCULAR | Status: DC | PRN

## 2019-03-22 MED ORDER — MEPERIDINE HCL 25 MG/ML IJ SOLN: 6.2500 mg | INTRAMUSCULAR | Status: DC | PRN

## 2019-03-22 MED ORDER — BUPIVACAINE-EPINEPHRINE (PF) 0.25% -1:200000 IJ SOLN
INTRAMUSCULAR | Status: DC | PRN
  Administered 2019-03-22: 25 mL

## 2019-03-22 MED ORDER — SODIUM CHLORIDE (PF) 0.9 % IJ SOLN
INTRAMUSCULAR | Status: AC
  Filled 2019-03-22: qty 20

## 2019-03-22 MED ORDER — CEFAZOLIN SODIUM-DEXTROSE 2-4 GM/100ML-% IV SOLN
INTRAVENOUS | Status: AC
  Filled 2019-03-22: qty 100

## 2019-03-22 MED ORDER — LIDOCAINE 2% (20 MG/ML) 5 ML SYRINGE
INTRAMUSCULAR | Status: AC
  Filled 2019-03-22: qty 5

## 2019-03-22 MED ORDER — SCOPOLAMINE 1 MG/3DAYS TD PT72
1.0000 | MEDICATED_PATCH | Freq: Once | TRANSDERMAL | Status: DC | PRN
  Administered 2019-03-22: 07:00:00 1.5 mg via TRANSDERMAL

## 2019-03-22 MED ORDER — BUPIVACAINE-EPINEPHRINE (PF) 0.25% -1:200000 IJ SOLN
INTRAMUSCULAR | Status: AC
  Filled 2019-03-22: qty 30

## 2019-03-22 MED ORDER — KETOROLAC TROMETHAMINE 30 MG/ML IJ SOLN
INTRAMUSCULAR | Status: DC | PRN
  Administered 2019-03-22: 30 mg via INTRAVENOUS

## 2019-03-22 MED ORDER — ONDANSETRON HCL 4 MG/2ML IJ SOLN
INTRAMUSCULAR | Status: DC | PRN
  Administered 2019-03-22: 4 mg via INTRAVENOUS

## 2019-03-22 MED ORDER — OXYCODONE HCL 5 MG PO TABS: 5.0000 mg | ORAL_TABLET | Freq: Once | ORAL | Status: DC | PRN

## 2019-03-22 MED ORDER — FENTANYL CITRATE (PF) 100 MCG/2ML IJ SOLN
INTRAMUSCULAR | Status: DC | PRN
  Administered 2019-03-22 (×4): 25 ug via INTRAVENOUS

## 2019-03-22 MED ORDER — GABAPENTIN 300 MG PO CAPS
ORAL_CAPSULE | ORAL | Status: AC
  Filled 2019-03-22: qty 1

## 2019-03-22 MED ORDER — TRAMADOL HCL 50 MG PO TABS: 50.0000 mg | ORAL_TABLET | Freq: Four times a day (QID) | ORAL | 1 refills | Status: DC | PRN

## 2019-03-22 MED ORDER — PROPOFOL 10 MG/ML IV BOLUS
INTRAVENOUS | Status: AC
  Filled 2019-03-22: qty 20

## 2019-03-22 MED ORDER — HYDROMORPHONE HCL 1 MG/ML IJ SOLN: 0.2500 mg | INTRAMUSCULAR | Status: DC | PRN

## 2019-03-22 MED ORDER — FENTANYL CITRATE (PF) 100 MCG/2ML IJ SOLN
50.0000 ug | INTRAMUSCULAR | Status: DC | PRN
  Administered 2019-03-22: 100 ug via INTRAVENOUS

## 2019-03-22 MED ORDER — TECHNETIUM TC 99M SULFUR COLLOID FILTERED
1.0000 | Freq: Once | INTRAVENOUS | Status: AC | PRN
  Administered 2019-03-22: 1 via INTRADERMAL

## 2019-03-22 MED ORDER — CHLORHEXIDINE GLUCONATE CLOTH 2 % EX PADS: 6.0000 | MEDICATED_PAD | Freq: Once | CUTANEOUS | Status: DC

## 2019-03-22 MED ORDER — MIDAZOLAM HCL 2 MG/2ML IJ SOLN
1.0000 mg | INTRAMUSCULAR | Status: DC | PRN
  Administered 2019-03-22 (×2): 2 mg via INTRAVENOUS

## 2019-03-22 MED ORDER — LIDOCAINE HCL (CARDIAC) PF 100 MG/5ML IV SOSY
PREFILLED_SYRINGE | INTRAVENOUS | Status: DC | PRN
  Administered 2019-03-22: 80 mg via INTRAVENOUS

## 2019-03-22 MED ORDER — GABAPENTIN 300 MG PO CAPS
300.0000 mg | ORAL_CAPSULE | ORAL | Status: AC
  Administered 2019-03-22: 07:00:00 300 mg via ORAL

## 2019-03-22 MED ORDER — KETOROLAC TROMETHAMINE 30 MG/ML IJ SOLN
INTRAMUSCULAR | Status: AC
  Filled 2019-03-22: qty 1

## 2019-03-22 SURGICAL SUPPLY — 52 items
ADH SKN CLS APL DERMABOND .7 (GAUZE/BANDAGES/DRESSINGS) ×1
APL PRP STRL LF DISP 70% ISPRP (MISCELLANEOUS) ×1
BENZOIN TINCTURE PRP APPL 2/3 (GAUZE/BANDAGES/DRESSINGS) IMPLANT
BINDER BREAST LRG (GAUZE/BANDAGES/DRESSINGS) ×3 IMPLANT
BINDER BREAST MEDIUM (GAUZE/BANDAGES/DRESSINGS) IMPLANT
BINDER BREAST XLRG (GAUZE/BANDAGES/DRESSINGS) IMPLANT
BINDER BREAST XXLRG (GAUZE/BANDAGES/DRESSINGS) IMPLANT
BLADE SURG 15 STRL LF DISP TIS (BLADE) ×1 IMPLANT
BLADE SURG 15 STRL SS (BLADE) ×3
CANISTER SUC SOCK COL 7IN (MISCELLANEOUS) IMPLANT
CANISTER SUCT 1200ML W/VALVE (MISCELLANEOUS) ×3 IMPLANT
CHLORAPREP W/TINT 26 (MISCELLANEOUS) ×3 IMPLANT
CLIP VESOCCLUDE SM WIDE 6/CT (CLIP) ×3 IMPLANT
CLOSURE WOUND 1/2 X4 (GAUZE/BANDAGES/DRESSINGS)
COVER BACK TABLE REUSABLE LG (DRAPES) ×3 IMPLANT
COVER MAYO STAND REUSABLE (DRAPES) ×3 IMPLANT
COVER PROBE W GEL 5X96 (DRAPES) ×3 IMPLANT
COVER WAND RF STERILE (DRAPES) IMPLANT
DECANTER SPIKE VIAL GLASS SM (MISCELLANEOUS) IMPLANT
DERMABOND ADVANCED (GAUZE/BANDAGES/DRESSINGS) ×2
DERMABOND ADVANCED .7 DNX12 (GAUZE/BANDAGES/DRESSINGS) ×1 IMPLANT
DRAPE LAPAROSCOPIC ABDOMINAL (DRAPES) ×3 IMPLANT
DRAPE UTILITY XL STRL (DRAPES) ×3 IMPLANT
DRSG PAD ABDOMINAL 8X10 ST (GAUZE/BANDAGES/DRESSINGS) IMPLANT
ELECT COATED BLADE 2.86 ST (ELECTRODE) ×3 IMPLANT
ELECT REM PT RETURN 9FT ADLT (ELECTROSURGICAL) ×3
ELECTRODE REM PT RTRN 9FT ADLT (ELECTROSURGICAL) ×1 IMPLANT
GAUZE SPONGE 4X4 12PLY STRL (GAUZE/BANDAGES/DRESSINGS) ×3 IMPLANT
GLOVE SURG SIGNA 7.5 PF LTX (GLOVE) ×6 IMPLANT
GOWN STRL REUS W/ TWL LRG LVL3 (GOWN DISPOSABLE) ×1 IMPLANT
GOWN STRL REUS W/ TWL XL LVL3 (GOWN DISPOSABLE) ×1 IMPLANT
GOWN STRL REUS W/TWL LRG LVL3 (GOWN DISPOSABLE) ×2
GOWN STRL REUS W/TWL XL LVL3 (GOWN DISPOSABLE) ×3
KIT MARKER MARGIN INK (KITS) ×3 IMPLANT
NDL SAFETY ECLIPSE 18X1.5 (NEEDLE) IMPLANT
NEEDLE HYPO 18GX1.5 SHARP (NEEDLE)
NEEDLE HYPO 25X1 1.5 SAFETY (NEEDLE) ×3 IMPLANT
NS IRRIG 1000ML POUR BTL (IV SOLUTION) ×3 IMPLANT
PACK BASIN DAY SURGERY FS (CUSTOM PROCEDURE TRAY) ×3 IMPLANT
PENCIL BUTTON HOLSTER BLD 10FT (ELECTRODE) ×3 IMPLANT
SHEET MEDIUM DRAPE 40X70 STRL (DRAPES) ×3 IMPLANT
SLEEVE SCD COMPRESS KNEE MED (MISCELLANEOUS) ×3 IMPLANT
SPONGE LAP 18X18 RF (DISPOSABLE) ×3 IMPLANT
STRIP CLOSURE SKIN 1/2X4 (GAUZE/BANDAGES/DRESSINGS) IMPLANT
SUT MNCRL AB 4-0 PS2 18 (SUTURE) ×3 IMPLANT
SUT VICRYL 3-0 CR8 SH (SUTURE) ×6 IMPLANT
SYR CONTROL 10ML LL (SYRINGE) ×3 IMPLANT
TOWEL GREEN STERILE FF (TOWEL DISPOSABLE) ×6 IMPLANT
TRAY FAXITRON CT DISP (TRAY / TRAY PROCEDURE) ×3 IMPLANT
TUBE CONNECTING 20'X1/4 (TUBING) ×1
TUBE CONNECTING 20X1/4 (TUBING) ×2 IMPLANT
YANKAUER SUCT BULB TIP NO VENT (SUCTIONS) ×3 IMPLANT

## 2019-03-22 NOTE — Transfer of Care (Signed)
Immediate Anesthesia Transfer of Care Note  Patient: Rhonda [name]  Procedure(s) Performed: Procedure(s) (LRB): RIGHT BREAST LUMPECTOMY WITH RADIOACTIVE SEED AND RIGHT AXILLARY SENTINEL LYMPH NODE BIOPSY (Right)  Patient Location: PACU  Anesthesia Type: General  Level of Consciousness: awake, sedated, patient cooperative and responds to stimulation  Airway & Oxygen Therapy: Patient Spontanous Breathing and Patient connected to face mask oxygen  Post-op Assessment: Report given to PACU RN, Post -op Vital signs reviewed and stable and Patient moving all extremities  Post vital signs: Reviewed and stable  Complications: No apparent anesthesia complications

## 2019-03-22 NOTE — Anesthesia Procedure Notes (Signed)
Procedure Name: LMA Insertion Date/Time: 03/22/2019 7:36 AM Performed by: Justice Rocher, CRNA Pre-anesthesia Checklist: Patient identified, Emergency Drugs available, Suction available and Patient being monitored Patient Re-evaluated:Patient Re-evaluated prior to induction Oxygen Delivery Method: Circle system utilized Preoxygenation: Pre-oxygenation with 100% oxygen Induction Type: IV induction Ventilation: Mask ventilation without difficulty LMA: LMA inserted LMA Size: 4.0 Number of attempts: 1 Airway Equipment and Method: Bite block Placement Confirmation: positive ETCO2 and breath sounds checked- equal and bilateral Tube secured with: Tape Dental Injury: Teeth and Oropharynx as per pre-operative assessment

## 2019-03-22 NOTE — Anesthesia Procedure Notes (Signed)
Anesthesia Regional Block: Pectoralis block   Pre-Anesthetic Checklist: ,, timeout performed, Correct Patient, Correct Site, Correct Laterality, Correct Procedure, Correct Position, site marked, Risks and benefits discussed,  Surgical consent,  Pre-op evaluation,  At surgeon's request and post-op pain management  Laterality: Right  Prep: chloraprep       Needles:  Injection technique: Single-shot  Needle Type: Stimiplex     Needle Length: 9cm  Needle Gauge: 21     Additional Needles:   Procedures:,,,, ultrasound used (permanent image in chart),,,,  Narrative:  Start time: 03/22/2019 7:03 AM End time: 03/22/2019 7:08 AM Injection made incrementally with aspirations every 5 mL.  Performed by: Personally  Anesthesiologist: Lynda Rainwater, MD

## 2019-03-22 NOTE — Anesthesia Preprocedure Evaluation (Signed)
Anesthesia Evaluation  Patient identified by MRN, date of birth, ID band Patient awake    Reviewed: Allergy & Precautions, H&P , NPO status , Patient's Chart, lab work & pertinent test results  History of Anesthesia Complications (+) PONV  Airway Mallampati: II  TM Distance: >3 FB Neck ROM: full    Dental no notable dental hx.    Pulmonary neg pulmonary ROS,    Pulmonary exam normal breath sounds clear to auscultation       Cardiovascular + Peripheral Vascular Disease  Normal cardiovascular exam Rhythm:regular Rate:Normal     Neuro/Psych Anxiety    GI/Hepatic   Endo/Other    Renal/GU      Musculoskeletal   Abdominal   Peds  Hematology   Anesthesia Other Findings Breast Cancer  Reproductive/Obstetrics                             Anesthesia Physical  Anesthesia Plan  ASA: III  Anesthesia Plan: General   Post-op Pain Management:    Induction: Intravenous  PONV Risk Score and Plan: 4 or greater and Ondansetron, Dexamethasone, Midazolam and Scopolamine patch - Pre-op  Airway Management Planned: LMA  Additional Equipment:   Intra-op Plan:   Post-operative Plan: Extubation in OR  Informed Consent: I have reviewed the patients History and Physical, chart, labs and discussed the procedure including the risks, benefits and alternatives for the proposed anesthesia with the patient or authorized representative who has indicated his/her understanding and acceptance.     Dental advisory given  Plan Discussed with: CRNA, Anesthesiologist and Surgeon  Anesthesia Plan Comments:         Anesthesia Quick Evaluation

## 2019-03-22 NOTE — Interval H&P Note (Signed)
History and Physical Interval Note:  03/22/2019 7:15 AM  Rhonda [name]  has presented today for surgery, with the diagnosis of RIGHT BREAST CANCER.  The various methods of treatment have been discussed with the patient and family.   Seed in place.  After consideration of risks, benefits and other options for treatment, the patient has consented to  Procedure(s): RIGHT BREAST LUMPECTOMY WITH RADIOACTIVE SEED AND RIGHT AXILLARY SENTINEL LYMPH NODE BIOPSY (Right) as a surgical intervention.  The patient's history has been reviewed, patient examined, no change in status, stable for surgery.  I have reviewed the patient's chart and labs.  Questions were answered to the patient's satisfaction.     Shann Medal

## 2019-03-22 NOTE — Anesthesia Postprocedure Evaluation (Signed)
Anesthesia Post Note  Patient: Rhonda [name]  Procedure(s) Performed: RIGHT BREAST LUMPECTOMY WITH RADIOACTIVE SEED AND RIGHT AXILLARY SENTINEL LYMPH NODE BIOPSY (Right Breast)     Patient location during evaluation: PACU Anesthesia Type: General Level of consciousness: awake and alert Pain management: pain level controlled Vital Signs Assessment: post-procedure vital signs reviewed and stable Respiratory status: spontaneous breathing, nonlabored ventilation and respiratory function stable Cardiovascular status: blood pressure returned to baseline and stable Postop Assessment: no apparent nausea or vomiting Anesthetic complications: no    Last Vitals:  Vitals:   03/22/19 0930 03/22/19 1000  BP: 121/66 122/70  Pulse: 70 70  Resp: (!) 21 18  Temp:  36.7 C  SpO2: 98% 100%    Last Pain:  Vitals:   03/22/19 1000  TempSrc:   PainSc: 0-No pain                 Lynda Rainwater

## 2019-03-22 NOTE — Progress Notes (Signed)
Assisted Dr. Miller with right, ultrasound guided, pectoralis block. Side rails up, monitors on throughout procedure. See vital signs in flow sheet. Tolerated Procedure well. 

## 2019-03-22 NOTE — Op Note (Addendum)
03/22/2019  8:51 AM  PATIENT:  Rhonda [name] DOB: 1961-07-03 MRN: [id number]  PREOP DIAGNOSIS:   RIGHT BREAST CANCER  POSTOP DIAGNOSIS:    Right breast cancer, 11:30 o'clock position (T1, N0)  PROCEDURE:   Procedure(s): RIGHT BREAST LUMPECTOMY WITH RADIOACTIVE SEED AND RIGHT AXILLARY SENTINEL LYMPH NODE BIOPSY, deep sentinel lymph node biopsy  SURGEON:   Alphonsa Overall, M.D.  ANESTHESIA:   general  Anesthesiologist: Lynda Rainwater, MD CRNA: Justice Rocher, CRNA  General  EBL:  10  ml  DRAINS:  none   LOCAL MEDICATIONS USED:   25 cc of 1/4% marcaine and right pectoral block by anesthesia  SPECIMEN:   Right breast lumpectomy (6 color) and right axillary sentinel lymph node biopsy (Counts 600, background 30)  COUNTS CORRECT:  YES  INDICATIONS FOR PROCEDURE:  Rhonda [name] is a 58 y.o. (DOB: [birth year]) white female whose primary care physician is Schoenhoff, Altamese Cabal, MD and comes for right breast lumpectomy and right axillary sentinel lymph node biopsy.   Rhonda [name] was seen at the [facility] with Drs. Magrinat and Kinard.   The options for breast cancer treatment have been discussed with the patient. She elected to proceed with lumpectomy and axillary sentinel lymph node.     The indications and potential complications of surgery were explained to the patient. Potential complications include, but are not limited to, bleeding, infection, the need for further surgery, and nerve injury.     She had a I131 seed placed on 03/19/2019 in her right breast at [facility].  The seed is in the 11:30 o'clock position of the right breast.   In the holding area, her right areola was injected with 1 millicurie of Technitium Sulfur Colloid.  OPERATIVE NOTE:   The patient was taken to operating room # 1 at [facility] where she underwent a general anesthesia  supervised by Anesthesiologist: Lynda Rainwater, MD CRNA: Justice Rocher, CRNA. Her right breast and axilla were prepped with   ChloraPrep and sterilely draped.    A time-out and the surgical check list was reviewed.    I turned attention to the cancer which was about at the 11:30 o'clock position of the right breast.   I used the Neoprobe to identify the I131 seed.  I tried to excise an area around the tumor of at least 1 cm.    I excised this block of breast tissue approximately 3 cm by 4 cm  in diameter.  I took the dissection down to the pectoralis major muscle.   I painted the lumpectomy specimen with the 6 color paint kit and did a specimen mammogram which confirmed the mass, clip, and the seed were all in the right position in the specimen.  The specimen was sent to pathology who called back to confirm that they have the seed and the specimen.   I then started the right deep axillary sentinel lymph node biopsy. I made an incision in the right axilla.  I found a hot area at the junction of the breast and the pectoralis major muscle, deep in the axilla. I cut down and  identified a hot node that had counts of 600 and the background has 30 counts.   I checked her internal mammary nodes and supraclavicular nodes with the neoprobe and found no other hot area. The axillary node was then sent to pathology.    I then irrigated the wound with saline. I infiltrated approximately 25 mL  of 1/4% Marcaine between the incisions.  She had a right pectoral block pre op.   I placed 4 clips to mark biopsy cavity, at 12, 3, 6, and 9 o'clock.  I then closed all the wounds in layers using 3-0 Vicryl sutures for the deep layer. At the skin, I closed the incisions with a 4-0 Monocryl suture. The incisions were then painted with Dermabond.  She had gauze place over the wounds and placed in a breast binder.   The patient tolerated the procedure well, was transported to the recovery room in good condition. Sponge and needle count were correct at the end of the case.   Final pathology is pending.   Alphonsa Overall, MD, [facility]  Surgery Pager: [phone] Office phone:  [phone]

## 2019-03-22 NOTE — Progress Notes (Signed)
Assisted nuc med tech with nuc med inj     Side rails up, monitors on throughout procedure. See vital signs in flow sheet. Tolerated Procedure well. 

## 2019-03-22 NOTE — Discharge Instructions (Signed)
[facility] - DISCHARGE INSTRUCTIONS TO PATIENT  Activity:  Driving - May drive in 1 to 3 days, if doing well and off pain meds   Lifting - No lifting more than 15 pounds for 1 week, then no limit  Wound Care:   Leave bandage/binder on for 2 days.  Then remove bandage and shower.  You may wear regular bra or binder after shower      Continue to practice social isolation after surgery.  Diet:  As tolerated  Follow up appointment:  Call Dr. Pollie Friar office [facility]) at [phone] for an appointment in 2 - 3 weeks.  Talk to our office before coming.  We are doing "e visits" on patients who are doing well.  Medications and dosages:  Resume your home medications.  You have a prescription for:  Ultram  Call Dr. Lucia Gaskins or his office  [phone]) if you have:  Temperature greater than 100.4,  Persistent nausea and vomiting,  Severe uncontrolled pain,  Redness, tenderness, or signs of infection (pain, swelling, redness, odor or green/yellow discharge around the site),  Any other questions or concerns you may have after discharge.  In an emergency, call 911 or go to an Emergency Department at a nearby hospital.   Post Anesthesia Home Care Instructions  Activity: Get plenty of rest for the remainder of the day. A responsible adult should stay with you for 24 hours following the procedure.  For the next 24 hours, DO NOT: -Drive a car -Paediatric nurse -Drink alcoholic beverages -Take any medication unless instructed by your physician -Make any legal decisions or sign important papers.  Meals: Start with liquid foods such as gelatin or soup. Progress to regular foods as tolerated. Avoid greasy, spicy, heavy foods. If nausea and/or vomiting occur, drink only clear liquids until the nausea and/or vomiting subsides. Call your physician if vomiting continues.  Special Instructions/Symptoms: Your throat may feel dry or sore from the anesthesia or the  breathing tube placed in your throat during surgery. If this causes discomfort, gargle with warm salt water. The discomfort should disappear within 24 hours.  If you had a scopolamine patch placed behind your ear for the management of post- operative nausea and/or vomiting:  1. The medication in the patch is effective for 72 hours, after which it should be removed.  Wrap patch in a tissue and discard in the trash. Wash hands thoroughly with soap and water. 2. You may remove the patch earlier than 72 hours if you experience unpleasant side effects which may include dry mouth, dizziness or visual disturbances. 3. Avoid touching the patch. Wash your hands with soap and water after contact with the patch.

## 2019-03-23 ENCOUNTER — Encounter (HOSPITAL_BASED_OUTPATIENT_CLINIC_OR_DEPARTMENT_OTHER): Payer: Self-pay | Admitting: Surgery

## 2019-03-24 ENCOUNTER — Telehealth: Payer: Self-pay | Admitting: *Deleted

## 2019-03-24 NOTE — Telephone Encounter (Signed)
Received order for oncotype testing. Requisition faxed to pathology and [place]. Received by Varney Biles

## 2019-03-26 ENCOUNTER — Telehealth: Payer: Self-pay | Admitting: *Deleted

## 2019-03-26 NOTE — Telephone Encounter (Signed)
.  Called pt and discussed next steps and assessed needs. Discussed anastrozole if xrt will be delayed as well as sending onctype testing on final pathology.  Pt relate she wishes to forgo genetic counseling at this time d/t she is unaware if brother actually had prostate cancer. Informed pt to let us know when she has decided to pursue testing. Gave emotional support and encouragement. Discussed ways to protect self during covid  Denies further questions or needs at this time.

## 2019-04-01 ENCOUNTER — Ambulatory Visit: Payer: BLUE CROSS/BLUE SHIELD | Admitting: Obstetrics and Gynecology

## 2019-04-01 DIAGNOSIS — Z170 Estrogen receptor positive status [ER+]: Secondary | ICD-10-CM | POA: Diagnosis not present

## 2019-04-01 DIAGNOSIS — C50211 Malignant neoplasm of upper-inner quadrant of right female breast: Secondary | ICD-10-CM | POA: Diagnosis not present

## 2019-04-02 ENCOUNTER — Telehealth: Payer: Self-pay | Admitting: *Deleted

## 2019-04-02 ENCOUNTER — Encounter: Payer: Self-pay | Admitting: *Deleted

## 2019-04-02 MED ORDER — ANASTROZOLE 1 MG PO TABS: 1.0000 mg | ORAL_TABLET | Freq: Every day | ORAL | 6 refills | Status: DC

## 2019-04-02 NOTE — Telephone Encounter (Signed)
Called patient to notify her that her appointments have been cancelled due to her having more surgery until further notice.

## 2019-04-02 NOTE — Telephone Encounter (Signed)
Received oncoytpe score of 14/4% Physician team notified. Called pt and discussed results and next steps

## 2019-04-02 NOTE — Telephone Encounter (Signed)
Received oncotype score of 14/4%. Physician team notified. Called pt with results and discussed does not need chemotherapy based on the results. Discussed initiation of anastrozole since xrt with Dr. Sondra Come will be delayed until June d/t safety during covid. Received verbal understanding. Confirmed pharmacy.  Denies further questions or concerns at this time.

## 2019-04-05 ENCOUNTER — Encounter: Payer: Self-pay | Admitting: *Deleted

## 2019-04-05 ENCOUNTER — Ambulatory Visit: Payer: BC Managed Care – PPO | Admitting: Radiation Oncology

## 2019-04-05 ENCOUNTER — Ambulatory Visit: Payer: BLUE CROSS/BLUE SHIELD | Admitting: Radiation Oncology

## 2019-04-05 ENCOUNTER — Ambulatory Visit: Payer: BLUE CROSS/BLUE SHIELD

## 2019-04-13 ENCOUNTER — Encounter: Payer: Self-pay | Admitting: Oncology

## 2019-04-26 ENCOUNTER — Encounter: Payer: Self-pay | Admitting: *Deleted

## 2019-05-13 NOTE — Progress Notes (Signed)
Location of Breast Cancer: Malignant neoplasm of upper-inner quadrant of right breast in female, estrogen receptor positive ([place])  Histology per Pathology Report: Diagnosis 1. Breast, lumpectomy, Right w/seed - INVASIVE DUCTAL CARCINOMA, 1.1 CM, NOTTINGHAM GRADE 1 OF 3. - MARGINS OF RESECTION ARE NOT INVOLVED (CLOSEST MARGIN: 7 MM, POSTERIOR). - DUCTAL CARCINOMA IN SITU. - BIOPSY SITE CHANGES. - SEE ONCOLOGY TABLE. 2. Lymph node, sentinel, biopsy, Right Axillary - ONE LYMPH NODE, NEGATIVE FOR CARCINOMA (0/1). 3. Lymph node, sentinel, biopsy, Right - ONE LYMPH NODE, NEGATIVE FOR CARCINOMA (0/1). 4. Lymph node, sentinel, biopsy, Right - ONE LYMPH NODE, NEGATIVE FOR CARCINOMA (0/1).  Receptor Status: ER(100%), PR (100%), Her2-neu (NEG), Ki67 (1%)  Did patient present with symptoms (if so, please note symptoms) or was this found on screening mammography?: Routine Mammogram  Past/Anticipated interventions by surgeon, if any: 03/22/19:  PROCEDURE:   Procedure(s): RIGHT BREAST LUMPECTOMY WITH RADIOACTIVE SEED AND RIGHT AXILLARY SENTINEL LYMPH NODE BIOPSY, deep sentinel lymph node biopsy  SURGEON:   Alphonsa Overall, M.D.  Past/Anticipated interventions by medical oncology, if any: Chemotherapy. Pt is on neoadjuvant anastrozole per Dr. Jana Hakim  Lymphedema issues, if any:  No  Pain issues, if any:  Pt denies c/o pain.  SAFETY ISSUES:  Prior radiation? no  Pacemaker/ICD? no  Possible current pregnancy?no  Is the patient on methotrexate? no  Current Complaints / other details:  Pt presents today for consult with Dr. Sondra Come for Radiation Oncology. Pt is anxious.     Loma Sousa, RN 05/17/2019,8:59 AM

## 2019-05-17 ENCOUNTER — Encounter (INDEPENDENT_AMBULATORY_CARE_PROVIDER_SITE_OTHER): Payer: Self-pay

## 2019-05-17 ENCOUNTER — Ambulatory Visit
Admission: RE | Admit: 2019-05-17 | Discharge: 2019-05-17 | Disposition: A | Discharge status: Home / Self Care | Payer: BC Managed Care – PPO | Source: Ambulatory Visit | Attending: Radiation Oncology | Admitting: Radiation Oncology

## 2019-05-17 ENCOUNTER — Other Ambulatory Visit: Payer: Self-pay

## 2019-05-17 ENCOUNTER — Encounter: Payer: Self-pay | Admitting: Radiation Oncology

## 2019-05-17 VITALS — BP 142/90 | HR 86 | Temp 98.7°F | Resp 18 | Ht 65.25 in | Wt 160.4 lb

## 2019-05-17 DIAGNOSIS — Z79899 Other long term (current) drug therapy: Secondary | ICD-10-CM | POA: Insufficient documentation

## 2019-05-17 DIAGNOSIS — C50211 Malignant neoplasm of upper-inner quadrant of right female breast: Secondary | ICD-10-CM

## 2019-05-17 DIAGNOSIS — Z170 Estrogen receptor positive status [ER+]: Secondary | ICD-10-CM | POA: Insufficient documentation

## 2019-05-17 DIAGNOSIS — Z79811 Long term (current) use of aromatase inhibitors: Secondary | ICD-10-CM | POA: Diagnosis not present

## 2019-05-17 DIAGNOSIS — F419 Anxiety disorder, unspecified: Secondary | ICD-10-CM | POA: Insufficient documentation

## 2019-05-17 DIAGNOSIS — Z9889 Other specified postprocedural states: Secondary | ICD-10-CM | POA: Diagnosis not present

## 2019-05-17 NOTE — Patient Instructions (Signed)
Coronavirus (COVID-19) Are you at risk?  Are you at risk for the Coronavirus (COVID-19)?  To be considered HIGH RISK for Coronavirus (COVID-19), you have to meet the following criteria:  . Traveled to [place], [place], [place], [place] or [place]; or in the [place] to [place], [place], [place], or [place]; and have fever, cough, and shortness of breath within the last 2 weeks of travel OR . Been in close contact with a person diagnosed with COVID-19 within the last 2 weeks and have fever, cough, and shortness of breath . IF YOU DO NOT MEET THESE CRITERIA, YOU ARE CONSIDERED LOW RISK FOR COVID-19.  What to do if you are HIGH RISK for COVID-19?  . If you are having a medical emergency, call 911. . Seek medical care right away. Before you go to a doctor's office, urgent care or emergency department, call ahead and tell them about your recent travel, contact with someone diagnosed with COVID-19, and your symptoms. You should receive instructions from your physician's office regarding next steps of care.  . When you arrive at healthcare provider, tell the healthcare staff immediately you have returned from visiting [place], [place], [place], [place] or [place]; or traveled in the [place] to [place], [place], [place], or [place]; in the last two weeks or you have been in close contact with a person diagnosed with COVID-19 in the last 2 weeks.   . Tell the health care staff about your symptoms: fever, cough and shortness of breath. . After you have been seen by a medical provider, you will be either: o Tested for (COVID-19) and discharged home on quarantine except to seek medical care if symptoms worsen, and asked to  - Stay home and avoid contact with others until you get your results (4-5 days)  - Avoid travel on public transportation if possible (such as bus, train, or airplane) or o Sent to the Emergency Department by EMS for evaluation, COVID-19 testing, and possible  admission depending on your condition and test results.  What to do if you are LOW RISK for COVID-19?  Reduce your risk of any infection by using the same precautions used for avoiding the common cold or flu:  . Wash your hands often with soap and warm water for at least 20 seconds.  If soap and water are not readily available, use an alcohol-based hand sanitizer with at least 60% alcohol.  . If coughing or sneezing, cover your mouth and nose by coughing or sneezing into the elbow areas of your shirt or coat, into a tissue or into your sleeve (not your hands). . Avoid shaking hands with others and consider head nods or verbal greetings only. . Avoid touching your eyes, nose, or mouth with unwashed hands.  . Avoid close contact with people who are sick. . Avoid places or events with large numbers of people in one location, like concerts or sporting events. . Carefully consider travel plans you have or are making. . If you are planning any travel outside or inside the [place], visit the CDC's Travelers' Health webpage for the latest health notices. . If you have some symptoms but not all symptoms, continue to monitor at home and seek medical attention if your symptoms worsen. . If you are having a medical emergency, call 911.   ADDITIONAL HEALTHCARE OPTIONS FOR PATIENTS  [facility] / e-Visit: [email or web address]         [facility]: [phone]  [facility]: [phone]                   [facility]: [phone]   

## 2019-05-17 NOTE — Progress Notes (Signed)
Radiation Oncology         [phone] ________________________________  Name: Rhonda [name] MRN: [id number]  Date: 05/17/2019  DOB: 1961/07/22  Reevaluation Visit Note  CC: Lanice Shirts, MD  Magrinat, Virgie Dad, MD    ICD-10-CM   1. 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  she is doing well overall. She was seen in the [facility] on 03/03/19 and returns today following definitive surgery. She is currently on neoadjuvant anastrozole per Dr. Jana Hakim.  Since they were last seen in the office, they had right lumpectomy with SLN procedure on 03/22/19. Pathology from this procedure confirmed invasive ductal carcinoma, measuring 1.1 cm, grade 1 of 3. Margins of resection were not involved (closest margin: 7 mm, posterior). DCIS was also present. 4/4 lymph nodes biopsied in the right axilla were negative for carcinoma.  Oncotype DX testing was performed showing a low risk for distant recurrence and therefore adjuvant chemotherapy was not recommended.                On review of systems, she reports feeling anxious today. she denies pain, issues with lymphedema and any other symptoms. Pertinent positives are listed and detailed within the above HPI.                  ALLERGIES:  is allergic to diflucan [fluconazole].  Meds: Current Outpatient Medications  Medication Sig Dispense Refill  . ACZONE 7.5 % GEL     . anastrozole (ARIMIDEX) 1 MG tablet Take 1 tablet (1 mg total) by mouth daily. 30 tablet 6  . Ascorbic Acid (VITAMIN C) 1000 MG tablet Take 1,000 mg by mouth daily.    Marland Kitchen lactobacillus acidophilus (BACID) TABS tablet Take 1 tablet by mouth 3 (three) times a week. Reported on 12/12/2015    . LORazepam (ATIVAN) 0.5 MG tablet Take  0.5 mg by mouth at bedtime.    . Magnesium 300 MG CAPS Take 1 capsule by mouth daily.    . multivitamin-lutein (OCUVITE-LUTEIN) CAPS Take 1 capsule by mouth daily.    Marland Kitchen OVER THE COUNTER MEDICATION Vitamin D 3 2000 iu taking in the a.m.    . OVER THE COUNTER MEDICATION Flaxseed oil 1000 mg taking daily    . OVER THE COUNTER MEDICATION Epax fish oil Omega 3 taking daily    . TRAVATAN Z 0.004 % SOLN ophthalmic solution Place 1 drop into both eyes at bedtime.    . traMADol (ULTRAM) 50 MG tablet Take 1-2 tablets (50-100 mg total) by mouth every 6 (six) hours as needed. (Patient not taking: Reported on 05/17/2019) 12 tablet 1   No current facility-administered medications for this encounter.     Physical Findings: The patient is in no acute distress. Patient is alert and oriented.  height is 5' 5.25" (1.657 m) and weight is 160 lb 6 oz (72.7 kg). Her temporal temperature is 98.7 F (37.1 C). Her blood pressure is 142/90 (abnormal) and her pulse is 86. Her respiration is 18 and oxygen saturation is 100%. .  Lungs are clear to auscultation bilaterally. Heart has regular rate and rhythm. No palpable cervical, supraclavicular, or axillary adenopathy. Abdomen soft, non-tender, normal bowel sounds.  Examination of the right breast reveals  a well-healing scar in the upper inner quadrant.  There is a separate scar in the axillary region which is also healed well.  No signs of infection within the breast dominant mass nipple discharge or bleeding.  Lab Findings: Lab Results  Component Value Date   WBC 6.4 03/03/2019   HGB 15.6 (H) 03/03/2019   HCT 47.0 (H) 03/03/2019   MCV 92.9 03/03/2019   PLT 230 03/03/2019    Radiographic Findings: No results found.  Impression: Stage IA invasive ductal carcinoma of the right breast.  The patient will be an excellent candidate for breast conservation with radiation therapy directed at the right breast..  I discussed the general course of treatment side effects and  potential toxicities of radiation therapy in the situation with the patient.  She appears to understand and wishes to proceed with planned course of treatment.  Also discussed consideration for hypofractionated accelerated radiation therapy for convenience issues for her.  The patient however feels most comfortable with conventional fractionation radiation therapy over 6-1/2 weeks.  Plan: Patient will proceed with CT simulation later this morning with treatment to begin next week.  Anticipant 6-1/2 weeks of radiation therapy.  ____________________________________   Blair Promise, PhD, MD    This document serves as a record of services personally performed by Gery Pray, MD. It was created on his behalf by Mary-Margaret Loma Messing, a trained medical scribe. The creation of this record is based on the scribe's personal observations and the provider's statements to them. This document has been checked and approved by the attending provider.

## 2019-05-17 NOTE — Progress Notes (Signed)
  Radiation Oncology         [phone] ________________________________  Name: Rhonda [name] MRN: [id number]  Date: 05/17/2019  DOB: 1960/12/21  SIMULATION AND TREATMENT PLANNING NOTE    ICD-10-CM   1. Malignant neoplasm of upper-inner quadrant of right breast in female, estrogen receptor positive ([place]) C50.211    Z17.0     DIAGNOSIS:  ClinicalStageIA(pT1c, pN0) RightBreast UIQ Invasive Ductal Carcinoma, ER+/ PR+/ Her2-, Grade1  NARRATIVE:  The patient was brought to the [facility].  Identity was confirmed.  All relevant records and images related to the planned course of therapy were reviewed.  The patient freely provided informed written consent to proceed with treatment after reviewing the details related to the planned course of therapy. The consent form was witnessed and verified by the simulation staff.  Then, the patient was set-up in a stable reproducible  supine position for radiation therapy.  CT images were obtained.  Surface markings were placed.  The CT images were loaded into the planning software.  Then the target and avoidance structures were contoured.  Treatment planning then occurred.  The radiation prescription was entered and confirmed.  Then, I designed and supervised the construction of a total of 5 medically necessary complex treatment devices.  I have requested : 3D Simulation  I have requested a DVH of the following structures: heart, lungs, lumpectomy cavity.  I have ordered:dose calc.  PLAN:  The patient will receive 50.4 Gy in 28 fractions followed by a boost to the lumpectomy cavity 10 Gray in 5 fractions for a cumulative dose of 60.4 Gray.   Optical Surface Tracking Plan:  Since intensity modulated radiotherapy (IMRT) and 3D conformal radiation treatment methods are predicated on accurate and precise positioning for treatment, intrafraction motion monitoring is medically necessary to ensure accurate and safe treatment delivery.  The  ability to quantify intrafraction motion without excessive ionizing radiation dose can only be performed with optical surface tracking. Accordingly, surface imaging offers the opportunity to obtain 3D measurements of patient position throughout IMRT and 3D treatments without excessive radiation exposure.  I am ordering optical surface tracking for this patient's upcoming course of radiotherapy. ________________________________    Blair Promise, PhD, MD  This document serves as a record of services personally performed by Gery Pray, MD. It was created on his behalf by Mary-Margaret Loma Messing, a trained medical scribe. The creation of this record is based on the scribe's personal observations and the provider's statements to them. This document has been checked and approved by the attending provider.

## 2019-05-19 ENCOUNTER — Telehealth: Payer: Self-pay | Admitting: *Deleted

## 2019-05-19 NOTE — Telephone Encounter (Signed)
  Oncology Nurse Navigator Documentation  Navigator Location: [facility] (05/19/19 1500)   )Navigator Encounter Type: Other(appt review) (05/19/19 1500)                                          Acuity: Level 2 (05/19/19 1500)         Time Spent with Patient: 15 (05/19/19 1500)

## 2019-05-20 DIAGNOSIS — Z170 Estrogen receptor positive status [ER+]: Secondary | ICD-10-CM | POA: Insufficient documentation

## 2019-05-20 DIAGNOSIS — C50211 Malignant neoplasm of upper-inner quadrant of right female breast: Secondary | ICD-10-CM | POA: Insufficient documentation

## 2019-05-24 ENCOUNTER — Other Ambulatory Visit: Payer: Self-pay

## 2019-05-24 ENCOUNTER — Ambulatory Visit
Admission: RE | Admit: 2019-05-24 | Discharge: 2019-05-24 | Disposition: A | Discharge status: Home / Self Care | Payer: BC Managed Care – PPO | Source: Ambulatory Visit | Attending: Radiation Oncology | Admitting: Radiation Oncology

## 2019-05-24 DIAGNOSIS — C50211 Malignant neoplasm of upper-inner quadrant of right female breast: Secondary | ICD-10-CM | POA: Diagnosis not present

## 2019-05-24 DIAGNOSIS — Z170 Estrogen receptor positive status [ER+]: Secondary | ICD-10-CM | POA: Diagnosis not present

## 2019-05-24 NOTE — Progress Notes (Signed)
  Radiation Oncology         [phone] ________________________________  Name: Rhonda [name] MRN: [id number]  Date: 05/24/2019  DOB: 09-18-1961  Simulation Verification Note    ICD-10-CM   1. Malignant neoplasm of upper-inner quadrant of right breast in female, estrogen receptor positive ([place]) C50.211    Z17.0     Status: outpatient  NARRATIVE: The patient was brought to the treatment unit and placed in the planned treatment position. The clinical setup was verified. Then port films were obtained and uploaded to the radiation oncology medical record software.  The treatment beams were carefully compared against the planned radiation fields. The position location and shape of the radiation fields was reviewed. They targeted volume of tissue appears to be appropriately covered by the radiation beams. Organs at risk appear to be excluded as planned.  Based on my personal review, I approved the simulation verification. The patient's treatment will proceed as planned.  -----------------------------------  Blair Promise, PhD, MD  This document serves as a record of services personally performed by Gery Pray, MD. It was created on his behalf by Mary-Margaret Loma Messing, a trained medical scribe. The creation of this record is based on the scribe's personal observations and the provider's statements to them. This document has been checked and approved by the attending provider.

## 2019-05-25 ENCOUNTER — Encounter (HOSPITAL_COMMUNITY): Payer: Self-pay | Admitting: Oncology

## 2019-05-25 ENCOUNTER — Other Ambulatory Visit: Payer: Self-pay | Admitting: Oncology

## 2019-05-25 ENCOUNTER — Other Ambulatory Visit: Payer: Self-pay

## 2019-05-25 ENCOUNTER — Ambulatory Visit
Admission: RE | Admit: 2019-05-25 | Discharge: 2019-05-25 | Disposition: A | Discharge status: Home / Self Care | Payer: BC Managed Care – PPO | Source: Ambulatory Visit | Attending: Radiation Oncology | Admitting: Radiation Oncology

## 2019-05-25 DIAGNOSIS — Z170 Estrogen receptor positive status [ER+]: Secondary | ICD-10-CM | POA: Diagnosis not present

## 2019-05-25 DIAGNOSIS — C50211 Malignant neoplasm of upper-inner quadrant of right female breast: Secondary | ICD-10-CM

## 2019-05-25 MED ORDER — RADIAPLEXRX EX GEL
Freq: Two times a day (BID) | CUTANEOUS | Status: DC
  Administered 2019-05-25: 15:00:00 via TOPICAL

## 2019-05-25 MED ORDER — ALRA NON-METALLIC DEODORANT (RAD-ONC)
1.0000 "application " | Freq: Once | TOPICAL | Status: AC
  Administered 2019-05-25: 1 via TOPICAL

## 2019-05-26 ENCOUNTER — Other Ambulatory Visit: Payer: Self-pay

## 2019-05-26 ENCOUNTER — Ambulatory Visit
Admission: RE | Admit: 2019-05-26 | Discharge: 2019-05-26 | Disposition: A | Discharge status: Home / Self Care | Payer: BC Managed Care – PPO | Source: Ambulatory Visit | Attending: Radiation Oncology | Admitting: Radiation Oncology

## 2019-05-26 DIAGNOSIS — C50211 Malignant neoplasm of upper-inner quadrant of right female breast: Secondary | ICD-10-CM | POA: Diagnosis not present

## 2019-05-26 DIAGNOSIS — Z170 Estrogen receptor positive status [ER+]: Secondary | ICD-10-CM | POA: Diagnosis not present

## 2019-05-27 ENCOUNTER — Ambulatory Visit
Admission: RE | Admit: 2019-05-27 | Discharge: 2019-05-27 | Disposition: A | Discharge status: Home / Self Care | Payer: BC Managed Care – PPO | Source: Ambulatory Visit | Attending: Radiation Oncology | Admitting: Radiation Oncology

## 2019-05-27 ENCOUNTER — Other Ambulatory Visit: Payer: Self-pay

## 2019-05-27 DIAGNOSIS — Z170 Estrogen receptor positive status [ER+]: Secondary | ICD-10-CM | POA: Diagnosis not present

## 2019-05-27 DIAGNOSIS — C50211 Malignant neoplasm of upper-inner quadrant of right female breast: Secondary | ICD-10-CM | POA: Diagnosis not present

## 2019-05-28 ENCOUNTER — Other Ambulatory Visit: Payer: Self-pay

## 2019-05-28 ENCOUNTER — Ambulatory Visit
Admission: RE | Admit: 2019-05-28 | Discharge: 2019-05-28 | Disposition: A | Discharge status: Home / Self Care | Payer: BC Managed Care – PPO | Source: Ambulatory Visit | Attending: Radiation Oncology | Admitting: Radiation Oncology

## 2019-05-28 DIAGNOSIS — Z170 Estrogen receptor positive status [ER+]: Secondary | ICD-10-CM | POA: Diagnosis not present

## 2019-05-28 DIAGNOSIS — C50211 Malignant neoplasm of upper-inner quadrant of right female breast: Secondary | ICD-10-CM | POA: Diagnosis not present

## 2019-05-31 ENCOUNTER — Ambulatory Visit
Admission: RE | Admit: 2019-05-31 | Discharge: 2019-05-31 | Disposition: A | Discharge status: Home / Self Care | Payer: BC Managed Care – PPO | Source: Ambulatory Visit | Attending: Radiation Oncology | Admitting: Radiation Oncology

## 2019-05-31 ENCOUNTER — Other Ambulatory Visit: Payer: Self-pay

## 2019-05-31 DIAGNOSIS — Z170 Estrogen receptor positive status [ER+]: Secondary | ICD-10-CM | POA: Diagnosis not present

## 2019-05-31 DIAGNOSIS — C50211 Malignant neoplasm of upper-inner quadrant of right female breast: Secondary | ICD-10-CM | POA: Diagnosis not present

## 2019-06-01 ENCOUNTER — Ambulatory Visit
Admission: RE | Admit: 2019-06-01 | Discharge: 2019-06-01 | Disposition: A | Discharge status: Home / Self Care | Payer: BC Managed Care – PPO | Source: Ambulatory Visit | Attending: Radiation Oncology | Admitting: Radiation Oncology

## 2019-06-01 ENCOUNTER — Other Ambulatory Visit: Payer: Self-pay

## 2019-06-01 DIAGNOSIS — Z170 Estrogen receptor positive status [ER+]: Secondary | ICD-10-CM | POA: Diagnosis not present

## 2019-06-01 DIAGNOSIS — C50211 Malignant neoplasm of upper-inner quadrant of right female breast: Secondary | ICD-10-CM | POA: Diagnosis not present

## 2019-06-02 ENCOUNTER — Ambulatory Visit
Admission: RE | Admit: 2019-06-02 | Discharge: 2019-06-02 | Disposition: A | Discharge status: Home / Self Care | Payer: BC Managed Care – PPO | Source: Ambulatory Visit | Attending: Radiation Oncology | Admitting: Radiation Oncology

## 2019-06-02 ENCOUNTER — Other Ambulatory Visit: Payer: Self-pay

## 2019-06-02 DIAGNOSIS — C50211 Malignant neoplasm of upper-inner quadrant of right female breast: Secondary | ICD-10-CM | POA: Diagnosis not present

## 2019-06-02 DIAGNOSIS — Z170 Estrogen receptor positive status [ER+]: Secondary | ICD-10-CM | POA: Diagnosis not present

## 2019-06-03 ENCOUNTER — Other Ambulatory Visit: Payer: Self-pay

## 2019-06-03 ENCOUNTER — Ambulatory Visit
Admission: RE | Admit: 2019-06-03 | Discharge: 2019-06-03 | Disposition: A | Discharge status: Home / Self Care | Payer: BC Managed Care – PPO | Source: Ambulatory Visit | Attending: Radiation Oncology | Admitting: Radiation Oncology

## 2019-06-03 DIAGNOSIS — Z170 Estrogen receptor positive status [ER+]: Secondary | ICD-10-CM | POA: Diagnosis not present

## 2019-06-03 DIAGNOSIS — C50211 Malignant neoplasm of upper-inner quadrant of right female breast: Secondary | ICD-10-CM | POA: Diagnosis not present

## 2019-06-04 ENCOUNTER — Other Ambulatory Visit: Payer: Self-pay

## 2019-06-04 ENCOUNTER — Ambulatory Visit
Admission: RE | Admit: 2019-06-04 | Discharge: 2019-06-04 | Disposition: A | Discharge status: Home / Self Care | Payer: BC Managed Care – PPO | Source: Ambulatory Visit | Attending: Radiation Oncology | Admitting: Radiation Oncology

## 2019-06-04 DIAGNOSIS — C50211 Malignant neoplasm of upper-inner quadrant of right female breast: Secondary | ICD-10-CM | POA: Diagnosis not present

## 2019-06-04 DIAGNOSIS — Z170 Estrogen receptor positive status [ER+]: Secondary | ICD-10-CM | POA: Diagnosis not present

## 2019-06-06 NOTE — Progress Notes (Signed)
[facility]  Telephone:([phone]) [phone] [phone]    ID: Rhonda [name] DOB: [birth year]  MR#: [id number]  HYW#:[id number]  Patient Care Team: Lanice Shirts, MD as PCP - General (Internal Medicine) Mauro Kaufmann, RN as Oncology Nurse Navigator Rockwell Germany, RN as Oncology Nurse Navigator Alphonsa Overall, MD as Consulting Physician (General Surgery) Shariq Puig, Virgie Dad, MD as Consulting Physician (Oncology) Gery Pray, MD as Consulting Physician (Radiation Oncology) Nunzio Cobbs, MD as Consulting Physician (Obstetrics and Gynecology) Macario Carls, MD as Referring Physician (Dermatology) Katy Apo, MD as Consulting Physician (Ophthalmology) Linus Mako, MD as Consulting Physician (Vascular Surgery) OTHER MD:   CHIEF COMPLAINT: Estrogen receptor positive breast cancer  CURRENT TREATMENT: Currently undergoing radiation; on anastrozole   HISTORY OF CURRENT ILLNESS: From the original intake note:  Rhonda [name] had routine screening mammography on 02/12/2019 showing a possible abnormality in the right breast. She underwent unilateral right diagnostic mammography with tomography and right breast ultrasonography at [facility] on 02/17/2019 showing: Breast Density Category C. a persistent irregular mass measuring approximately 0.8 cm. Ultrasound of the right breast at 12 o'clock, 5 cm from the nipple demonstrates an irregular hypoechoic mass with indistinct margins measuring 0.7 x 0.5 x 0.7 cm. No blood flow seen within the mass on color Doppler imaging. Ultrasound of the right axilla demonstrates multiple normal-appearing nodes.  Accordingly on 02/22/2019 she proceeded to biopsy of the right breast area in question. 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 Prognostic indicators significant for: estrogen receptor, 100% positive and progesterone receptor, 100%  positive, both with strong staining intensity. Proliferation marker Ki67 at 1%. HER2 equivocal (2+) by immunohistochemistry but negative by fluorescent in situ hybridization with a signals ratio 1.32 and number per cell 1.85.   The patient's subsequent history is as detailed below.   INTERVAL HISTORY: Jamaris returns today for follow-up and treatment of her estrogen receptor positive breast cancer.  Since her last visit here, she underwent a right breast lumpectomy on 03/22/2019. The pathology from this procedure showed ([id number]):  1. Breast, lumpectomy, right w/seed - invasive ductal carcinoma, 1.1 cm, nottingham grade 1 of 3. - margins of resection are not involved (closest margin: 7 mm, posterior). - ductal carcinoma in situ. - biopsy site changes. - see oncology table. 2. Lymph node, sentinel, biopsy, right axillary - one lymph node, negative for carcinoma (0/1). 3. Lymph node, sentinel, biopsy, right - one lymph node, negative for carcinoma (0/1). 4. Lymph node, sentinel, biopsy, right   - one lymph node, negative for carcinoma (0/1).  The Oncotype DX score was 14 predicting a risk of outside the breast recurrence over the next 9 years of 4 % if the patient's only systemic therapy is tamoxifen for 5 years.   She started anastrozole. She is experiencing hot flashes, a few times per day, but they do not wake her up during the day. She is having some mild vaginal dryness.   She started adjuvant radiation on 05/25/2019. She has some fatigue from radiation, but she powers through it.  She wants to think about genetics testing more because she is nervous about what the results might be.    REVIEW OF SYSTEMS: Venia notes that her surgery went well. She had very minimal pain following surgery. For exercise, she likes to walk for about an hour or an hour and a half. The patient denies unusual headaches, visual changes, nausea, vomiting,  or dizziness. There has been no unusual cough,  phlegm production, or pleurisy. This been no change in bowel or bladder habits. The patient denies unexplained fatigue or unexplained weight loss, bleeding, rash, or fever. A detailed review of systems was otherwise noncontributory.    PAST MEDICAL HISTORY: Past Medical History:  Diagnosis Date  . Allergy    seasonal  . Anxiety   . Cancer ([facility]) 02/2019   right breast IDC  . Fibroid   . Glaucoma   . Leg pain, bilateral   . Lung nodules    benign  . Other and unspecified hyperlipidemia 04/05/2013  . Peripheral vascular disease ([facility])    chronic venous insufficiency bilaterally  . PONV (postoperative nausea and vomiting)   . Varicose veins of both lower extremities      PAST SURGICAL HISTORY: Past Surgical History:  Procedure Laterality Date  . BLADDER SUSPENSION  2010   Dr.Silva  . BREAST CYST ASPIRATION Right 09/08/2012  . BREAST LUMPECTOMY WITH RADIOACTIVE SEED AND SENTINEL LYMPH NODE BIOPSY Right 03/22/2019   Procedure: RIGHT BREAST LUMPECTOMY WITH RADIOACTIVE SEED AND RIGHT AXILLARY SENTINEL LYMPH NODE BIOPSY;  Surgeon: Alphonsa Overall, MD;  Location: [facility];  Service: General;  Laterality: Right;  . INSERTION OF MESH N/A 11/01/2016   Procedure: INSERTION OF MESH;  Surgeon: Jackolyn Confer, MD;  Location: [facility];  Service: General;  Laterality: N/A;  . RHINOPLASTY    . TONSILLECTOMY    . varicose veins    . [place]  . VENTRAL HERNIA REPAIR N/A 11/01/2016   Procedure: VENTRAL HERNIA REPAIR WITH MESH;  Surgeon: Jackolyn Confer, MD;  Location: [facility];  Service: General;  Laterality: N/A;     FAMILY HISTORY: Family History  Problem Relation Age of Onset  . Cancer Maternal Grandmother        breast cancer  . Breast cancer Maternal Grandmother        unsure of age  . Heart disease Maternal Grandfather   . Stroke Brother   . Alcohol abuse Brother   . Liver disease Brother   . Varicose Veins Brother   . Heart disease Brother   .  Prostate cancer Brother        d. 20  . Cancer Mother   . Tracheal cancer Mother   . Varicose Veins Father   . Heart disease Father   . Prostate cancer Father   . Heart attack Brother   . Varicose Veins Paternal Grandmother    Yuridia's father died from congestive heart failure at age 81. Patients' mother died from tracheal carcinoma at age 2. The patient has 4 brothers and 1 sister. Patient denies anyone in her family having ovarian, or pancreatic cancer. Selita's mother was diagnosed with tracheal carcinoma and acoustic neuroma at age 42. Sharda's father was diagnosed with prostate cancer in his 38's. Christyanna's brother was diagnosed with prostate cancer in his mid 65's. Estefanny's maternal grandmother was diagnosed with breast cancer in her late 31's.    GYNECOLOGIC HISTORY:  Patient's last menstrual period was 04/05/2016 (approximate). 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 Her husband, Richardson Landry, is a retired Community education officer for [employer]. They have two children, Copywriter, advertising and [employer]. Remo Lipps is 19, lives in  [place], and is a music professor. Cherly Beach is 26, lives in [place], [place], and recently graduated with for business. Our [employer] of [employer].   ADVANCED DIRECTIVES: In the absence of any documentation, Breasia's spouse, Richardson Landry, is her healthcare power of attorney.      HEALTH MAINTENANCE: Social History   Tobacco Use  . Smoking status: Never Smoker  . Smokeless tobacco: Never Used  Substance Use Topics  . Alcohol use: Yes    Alcohol/week: 7.0 standard drinks    Types: 7 Glasses of wine per week    Comment: 3-4 glasses of wine a week  . Drug use: No    Colonoscopy: yes, 05/2017, normal; Dr. Darrel Hoover   PAP: 03/2017  Bone density: yes, 2008 Mammography:  02/12/2019  Allergies  Allergen Reactions  . Diflucan [Fluconazole] Anaphylaxis    Tongue swelling    Current Outpatient Medications  Medication Sig Dispense Refill  . ACZONE 7.5 % GEL     . anastrozole (ARIMIDEX) 1 MG tablet Take 1 tablet (1 mg total) by mouth daily. 30 tablet 6  . Ascorbic Acid (VITAMIN C) 1000 MG tablet Take 1,000 mg by mouth daily.    Marland Kitchen lactobacillus acidophilus (BACID) TABS tablet Take 1 tablet by mouth 3 (three) times a week. Reported on 12/12/2015    . LORazepam (ATIVAN) 0.5 MG tablet Take 0.5 mg by mouth at bedtime.    . Magnesium 300 MG CAPS Take 1 capsule by mouth daily.    . multivitamin-lutein (OCUVITE-LUTEIN) CAPS Take 1 capsule by mouth daily.    Marland Kitchen OVER THE COUNTER MEDICATION Vitamin D 3 2000 iu taking in the a.m.    . OVER THE COUNTER MEDICATION Flaxseed oil 1000 mg taking daily    . OVER THE COUNTER MEDICATION Epax fish oil Omega 3 taking daily    . traMADol (ULTRAM) 50 MG tablet Take 1-2 tablets (50-100 mg total) by mouth every 6 (six) hours as needed. (Patient not taking: Reported on 05/17/2019) 12 tablet 1  . TRAVATAN Z 0.004 % SOLN ophthalmic solution Place 1 drop into both eyes at bedtime.     No current facility-administered medications for this visit.      OBJECTIVE: Middle-aged white woman in no acute distress  Vitals:   06/07/19 1429  BP: 139/66  Pulse: 90  Resp: 18  Temp: 98.5 F (36.9 C)  SpO2: 100%     Body mass index is 26.37 kg/m.   Wt Readings from Last 3 Encounters:  06/07/19 159 lb 11.2 oz (72.4 kg)  05/17/19 160 lb 6 oz (72.7 kg)  03/22/19 156 lb 8.4 oz (71 kg)      ECOG FS:0 - Asymptomatic  Sclerae unicteric, EOMs intact No cervical or supraclavicular adenopathy Lungs no rales or rhonchi Heart regular rate and rhythm Abd soft, nontender, positive bowel sounds MSK no focal spinal tenderness, no upper extremity lymphedema Neuro: nonfocal, well oriented, appropriate affect Breasts: The right breast is status post  lumpectomy and currently receiving radiation.  The cosmetic result is excellent.  There is minimal erythema.  There is no evidence of residual or recurrent disease.  The left breast shows the nipple to be pointing slightly down.  There are no findings of concern.  Both axillae are benign.   LAB RESULTS:  CMP     Component Value Date/Time   NA 141 06/07/2019 1351   K 4.0 06/07/2019 1351   CL 105 06/07/2019 1351   CO2 27 06/07/2019 1351   GLUCOSE 104 (  H) 06/07/2019 1351   BUN 15 06/07/2019 1351   CREATININE 0.86 06/07/2019 1351   CREATININE 0.83 03/03/2019 0820   CREATININE 0.74 12/12/2015 1722   CALCIUM 9.3 06/07/2019 1351   PROT 7.4 06/07/2019 1351   ALBUMIN 4.3 06/07/2019 1351   AST 23 06/07/2019 1351   AST 16 03/03/2019 0820   ALT 22 06/07/2019 1351   ALT 16 03/03/2019 0820   ALKPHOS 67 06/07/2019 1351   BILITOT 0.4 06/07/2019 1351   BILITOT 0.8 03/03/2019 0820   GFRNONAA >60 06/07/2019 1351   GFRNONAA >60 03/03/2019 0820   GFRNONAA >89 12/12/2015 1722   GFRAA >60 06/07/2019 1351   GFRAA >60 03/03/2019 0820   GFRAA >89 12/12/2015 1722    No results found for: TOTALPROTELP, ALBUMINELP, A1GS, A2GS, BETS, BETA2SER, GAMS, MSPIKE, SPEI  No results found for: KPAFRELGTCHN, LAMBDASER, KAPLAMBRATIO  Lab Results  Component Value Date   WBC 5.8 06/07/2019   NEUTROABS 4.1 06/07/2019   HGB 15.1 (H) 06/07/2019   HCT 45.9 06/07/2019   MCV 91.6 06/07/2019   PLT 225 06/07/2019    '@LASTCHEMISTRY' @  No results found for: LABCA2  No components found for: TFTDDU202  No results for input(s): INR in the last 168 hours.  No results found for: LABCA2  No results found for: RKY706  No results found for: CBJ628  No results found for: BTD176  No results found for: CA2729  No components found for: HGQUANT  No results found for: CEA1 / No results found for: CEA1   No results found for: AFPTUMOR  No results found for: CHROMOGRNA  No results found for: PSA1  Appointment  on 06/07/2019  Component Date Value Ref Range Status  . Sodium 06/07/2019 141  135 - 145 mmol/L Final  . Potassium 06/07/2019 4.0  3.5 - 5.1 mmol/L Final  . Chloride 06/07/2019 105  98 - 111 mmol/L Final  . CO2 06/07/2019 27  22 - 32 mmol/L Final  . Glucose, Bld 06/07/2019 104* 70 - 99 mg/dL Final  . BUN 06/07/2019 15  6 - 20 mg/dL Final  . Creatinine, Ser 06/07/2019 0.86  0.44 - 1.00 mg/dL Final  . Calcium 06/07/2019 9.3  8.9 - 10.3 mg/dL Final  . Total Protein 06/07/2019 7.4  6.5 - 8.1 g/dL Final  . Albumin 06/07/2019 4.3  3.5 - 5.0 g/dL Final  . AST 06/07/2019 23  15 - 41 U/L Final  . ALT 06/07/2019 22  0 - 44 U/L Final  . Alkaline Phosphatase 06/07/2019 67  38 - 126 U/L Final  . Total Bilirubin 06/07/2019 0.4  0.3 - 1.2 mg/dL Final  . GFR calc non Af Amer 06/07/2019 >60  >60 mL/min Final  . GFR calc Af Amer 06/07/2019 >60  >60 mL/min Final  . Anion gap 06/07/2019 9  5 - 15 Final   Performed at [facility], [place] [address] [place], [place] [address]  . WBC 06/07/2019 5.8  4.0 - 10.5 K/uL Final  . RBC 06/07/2019 5.01  3.87 - 5.11 MIL/uL Final  . Hemoglobin 06/07/2019 15.1* 12.0 - 15.0 g/dL Final  . HCT 06/07/2019 45.9  36.0 - 46.0 % Final  . MCV 06/07/2019 91.6  80.0 - 100.0 fL Final  . MCH 06/07/2019 30.1  26.0 - 34.0 pg Final  . MCHC 06/07/2019 32.9  30.0 - 36.0 g/dL Final  . RDW 06/07/2019 11.9  11.5 - 15.5 % Final  . Platelets 06/07/2019 225  150 - 400 K/uL Final  .  nRBC 06/07/2019 0.0  0.0 - 0.2 % Final  . Neutrophils Relative % 06/07/2019 72  % Final  . Neutro Abs 06/07/2019 4.1  1.7 - 7.7 K/uL Final  . Lymphocytes Relative 06/07/2019 19  % Final  . Lymphs Abs 06/07/2019 1.1  0.7 - 4.0 K/uL Final  . Monocytes Relative 06/07/2019 6  % Final  . Monocytes Absolute 06/07/2019 0.4  0.1 - 1.0 K/uL Final  . Eosinophils Relative 06/07/2019 2  % Final  . Eosinophils Absolute 06/07/2019 0.1  0.0 - 0.5 K/uL Final  . Basophils Relative 06/07/2019 1  % Final   . Basophils Absolute 06/07/2019 0.0  0.0 - 0.1 K/uL Final  . Immature Granulocytes 06/07/2019 0  % Final  . Abs Immature Granulocytes 06/07/2019 0.01  0.00 - 0.07 K/uL Final   Performed at [facility], [address] [place], [place] [address]    (this displays the last labs from the last 3 days)  No results found for: TOTALPROTELP, ALBUMINELP, A1GS, A2GS, BETS, BETA2SER, GAMS, MSPIKE, SPEI (this displays SPEP labs)  No results found for: KPAFRELGTCHN, LAMBDASER, KAPLAMBRATIO (kappa/lambda light chains)  No results found for: HGBA, HGBA2QUANT, HGBFQUANT, HGBSQUAN (Hemoglobinopathy evaluation)   No results found for: LDH  No results found for: IRON, TIBC, IRONPCTSAT (Iron and TIBC)  No results found for: FERRITIN  Urinalysis    Component Value Date/Time   COLORURINE YELLOW 05/29/2009 1120   APPEARANCEUR CLEAR 05/29/2009 1120   LABSPEC 1.015 05/29/2009 1120   PHURINE 7.5 05/29/2009 1120   GLUCOSEU NEGATIVE 05/29/2009 1120   HGBUR NEGATIVE 05/29/2009 1120   BILIRUBINUR negative 12/12/2015 1735   BILIRUBINUR n 02/13/2015 1150   KETONESUR negative 12/12/2015 1735   KETONESUR NEGATIVE 05/29/2009 1120   PROTEINUR negative 12/12/2015 1735   PROTEINUR n 02/13/2015 1150   PROTEINUR NEGATIVE 05/29/2009 1120   UROBILINOGEN 0.2 12/12/2015 1735   UROBILINOGEN 0.2 05/29/2009 1120   NITRITE Negative 12/12/2015 1735   NITRITE n 02/13/2015 1150   NITRITE NEGATIVE 05/29/2009 1120   LEUKOCYTESUR Negative 12/12/2015 1735     STUDIES:  No results found.   ELIGIBLE FOR AVAILABLE RESEARCH PROTOCOL: no   ASSESSMENT: 58 y.o. [place], [place] woman status post right breast upper inner quadrant biopsy 02/22/2019 for a clinical T1b N0, stage IA base of ductal carcinoma, rate 1, estrogen and progesterone receptor positive, HER-2 negative by FISH, with an MIB-1 of 1%  (1) status post right lumpectomy and sentinel lymph node sampling 03/22/2019 for a pT1c pN0,  stage IA invasive ductal carcinoma, with negative margins.  (a) a total of 3 sentinel lymph nodes were removed  (2) Oncotype DX score of 14 predicts a risk of recurrence outside the breast in the next 9 years of 4% if the patient's only systemic treatment is antiestrogens for 5 years.  It also predicts no benefit from chemotherapy  (3) adjuvant radiation to be completed 07/08/2019  (4) anastrozole started 04/02/2019  (a) bone density 10/17/2003 showed a T score of -2.1   PLAN: Artasia is tolerating radiation well and will be completing it late July.  We started her on anastrozole in case there was a delay in her surgery.  The reason we picked anastrozole is that it would not interfere with surgery the way tamoxifen might.  However if she does have significant osteopenia or possibly early osteoporosis we could consider switching to tamoxifen.  On the other hand she is concerned because of her history of superficial phlebitis.  Accordingly I am  setting her up for a bone density late November.  She will have her mammography at that time.  That will be her new baseline.  We discussed genetics testing extensively.  She does meet criteria.  At this point she is not ready that she wishes to proceed  She tells me her younger son has been found to be mildly thrombocytopenic.  She will let me know if she wants [place] to do further evaluation  She has lost her primary care physician and I suggested she call the lobe our group and see if there is a clinic near her that she could establish herself with  Otherwise she will see me early December  She knows to call for any issue that may develop before then.   Fawaz Borquez, Virgie Dad, MD  06/07/19 3:04 PM Medical Oncology and Hematology [facility] [address] [place], [place] [address] Tel. [phone]    Fax. [phone]   I, Jacqualyn Posey am acting as a Education administrator for Chauncey Cruel, MD.   I, Lurline Del MD, have reviewed the  above documentation for accuracy and completeness, and I agree with the above.

## 2019-06-07 ENCOUNTER — Inpatient Hospital Stay: Payer: BC Managed Care – PPO

## 2019-06-07 ENCOUNTER — Inpatient Hospital Stay: Payer: BC Managed Care – PPO | Attending: Oncology | Admitting: Oncology

## 2019-06-07 ENCOUNTER — Other Ambulatory Visit: Payer: Self-pay

## 2019-06-07 ENCOUNTER — Ambulatory Visit
Admission: RE | Admit: 2019-06-07 | Discharge: 2019-06-07 | Disposition: A | Discharge status: Home / Self Care | Payer: BC Managed Care – PPO | Source: Ambulatory Visit | Attending: Radiation Oncology | Admitting: Radiation Oncology

## 2019-06-07 VITALS — BP 139/66 | HR 90 | Temp 98.5°F | Resp 18 | Ht 65.25 in | Wt 159.7 lb

## 2019-06-07 DIAGNOSIS — C50211 Malignant neoplasm of upper-inner quadrant of right female breast: Secondary | ICD-10-CM | POA: Insufficient documentation

## 2019-06-07 DIAGNOSIS — Z170 Estrogen receptor positive status [ER+]: Secondary | ICD-10-CM

## 2019-06-07 DIAGNOSIS — Z79811 Long term (current) use of aromatase inhibitors: Secondary | ICD-10-CM | POA: Diagnosis not present

## 2019-06-07 DIAGNOSIS — M8580 Other specified disorders of bone density and structure, unspecified site: Secondary | ICD-10-CM

## 2019-06-07 DIAGNOSIS — F419 Anxiety disorder, unspecified: Secondary | ICD-10-CM | POA: Insufficient documentation

## 2019-06-07 DIAGNOSIS — Z800 Family history of malignant neoplasm of digestive organs: Secondary | ICD-10-CM | POA: Insufficient documentation

## 2019-06-07 DIAGNOSIS — R918 Other nonspecific abnormal finding of lung field: Secondary | ICD-10-CM | POA: Diagnosis not present

## 2019-06-07 DIAGNOSIS — Z79899 Other long term (current) drug therapy: Secondary | ICD-10-CM | POA: Diagnosis not present

## 2019-06-07 DIAGNOSIS — Z8042 Family history of malignant neoplasm of prostate: Secondary | ICD-10-CM | POA: Diagnosis not present

## 2019-06-07 DIAGNOSIS — R232 Flushing: Secondary | ICD-10-CM | POA: Diagnosis not present

## 2019-06-07 DIAGNOSIS — E785 Hyperlipidemia, unspecified: Secondary | ICD-10-CM | POA: Insufficient documentation

## 2019-06-07 DIAGNOSIS — Z803 Family history of malignant neoplasm of breast: Secondary | ICD-10-CM | POA: Diagnosis not present

## 2019-06-07 DIAGNOSIS — R5383 Other fatigue: Secondary | ICD-10-CM | POA: Insufficient documentation

## 2019-06-07 DIAGNOSIS — I739 Peripheral vascular disease, unspecified: Secondary | ICD-10-CM | POA: Insufficient documentation

## 2019-06-07 LAB — COMPREHENSIVE METABOLIC PANEL
ALT: 22 U/L (ref 0–44)
AST: 23 U/L (ref 15–41)
Albumin: 4.3 g/dL (ref 3.5–5.0)
Alkaline Phosphatase: 67 U/L (ref 38–126)
Anion gap: 9 (ref 5–15)
BUN: 15 mg/dL (ref 6–20)
CO2: 27 mmol/L (ref 22–32)
Calcium: 9.3 mg/dL (ref 8.9–10.3)
Chloride: 105 mmol/L (ref 98–111)
Creatinine, Ser: 0.86 mg/dL (ref 0.44–1.00)
GFR calc Af Amer: >60 mL/min (ref >60)
GFR calc non Af Amer: >60 mL/min (ref >60)
Glucose, Bld: 104 mg/dL — ABNORMAL HIGH (ref 70–99)
Potassium: 4 mmol/L (ref 3.5–5.1)
Sodium: 141 mmol/L (ref 135–145)
Total Bilirubin: 0.4 mg/dL (ref 0.3–1.2)
Total Protein: 7.4 g/dL (ref 6.5–8.1)

## 2019-06-07 LAB — CBC WITH DIFFERENTIAL/PLATELET
Abs Immature Granulocytes: 0.01 10*3/uL (ref 0.00–0.07)
Basophils Absolute: 0 10*3/uL (ref 0.0–0.1)
Basophils Relative: 1 %
Eosinophils Absolute: 0.1 10*3/uL (ref 0.0–0.5)
Eosinophils Relative: 2 %
HCT: 45.9 % (ref 36.0–46.0)
Hemoglobin: 15.1 g/dL — ABNORMAL HIGH (ref 12.0–15.0)
Immature Granulocytes: 0 %
Lymphocytes Relative: 19 %
Lymphs Abs: 1.1 10*3/uL (ref 0.7–4.0)
MCH: 30.1 pg (ref 26.0–34.0)
MCHC: 32.9 g/dL (ref 30.0–36.0)
MCV: 91.6 fL (ref 80.0–100.0)
Monocytes Absolute: 0.4 10*3/uL (ref 0.1–1.0)
Monocytes Relative: 6 %
Neutro Abs: 4.1 10*3/uL (ref 1.7–7.7)
Neutrophils Relative %: 72 %
Platelets: 225 10*3/uL (ref 150–400)
RBC: 5.01 MIL/uL (ref 3.87–5.11)
RDW: 11.9 % (ref 11.5–15.5)
WBC: 5.8 10*3/uL (ref 4.0–10.5)
nRBC: 0 % (ref 0.0–0.2)

## 2019-06-08 ENCOUNTER — Ambulatory Visit
Admission: RE | Admit: 2019-06-08 | Discharge: 2019-06-08 | Disposition: A | Discharge status: Home / Self Care | Payer: BC Managed Care – PPO | Source: Ambulatory Visit | Attending: Radiation Oncology | Admitting: Radiation Oncology

## 2019-06-08 ENCOUNTER — Other Ambulatory Visit: Payer: Self-pay

## 2019-06-08 ENCOUNTER — Other Ambulatory Visit: Payer: Self-pay | Admitting: Genetic Counselor

## 2019-06-08 DIAGNOSIS — Z170 Estrogen receptor positive status [ER+]: Secondary | ICD-10-CM | POA: Diagnosis not present

## 2019-06-08 DIAGNOSIS — C50211 Malignant neoplasm of upper-inner quadrant of right female breast: Secondary | ICD-10-CM | POA: Diagnosis not present

## 2019-06-09 ENCOUNTER — Encounter: Payer: Self-pay | Admitting: Genetic Counselor

## 2019-06-09 ENCOUNTER — Inpatient Hospital Stay: Payer: BC Managed Care – PPO

## 2019-06-09 ENCOUNTER — Ambulatory Visit
Admission: RE | Admit: 2019-06-09 | Discharge: 2019-06-09 | Disposition: A | Discharge status: Home / Self Care | Payer: BC Managed Care – PPO | Source: Ambulatory Visit | Attending: Radiation Oncology | Admitting: Radiation Oncology

## 2019-06-09 ENCOUNTER — Other Ambulatory Visit: Payer: Self-pay

## 2019-06-09 ENCOUNTER — Inpatient Hospital Stay (HOSPITAL_BASED_OUTPATIENT_CLINIC_OR_DEPARTMENT_OTHER): Payer: BC Managed Care – PPO | Admitting: Genetic Counselor

## 2019-06-09 DIAGNOSIS — Z8042 Family history of malignant neoplasm of prostate: Secondary | ICD-10-CM | POA: Diagnosis not present

## 2019-06-09 DIAGNOSIS — C50211 Malignant neoplasm of upper-inner quadrant of right female breast: Secondary | ICD-10-CM

## 2019-06-09 DIAGNOSIS — Z170 Estrogen receptor positive status [ER+]: Secondary | ICD-10-CM

## 2019-06-09 DIAGNOSIS — Z803 Family history of malignant neoplasm of breast: Secondary | ICD-10-CM

## 2019-06-09 NOTE — Progress Notes (Signed)
REFERRING PROVIDER: Chauncey Cruel, MD [address] [place],  [place] [address]  PRIMARY PROVIDER:  Coralyn Mark Altamese Cabal, MD  PRIMARY REASON FOR VISIT:  1. Malignant neoplasm of upper-inner quadrant of right breast in female, estrogen receptor positive ([facility])   2. Family history of breast cancer   3. Family history of prostate cancer      HISTORY OF PRESENT ILLNESS:   Rhonda [name], a 58 y.o. female, was seen for a [facility] cancer genetics consultation at the request of Dr. Jana Hakim due to a personal and family history of cancer.  Rhonda [name] presents to clinic today to discuss the possibility of a hereditary predisposition to cancer, genetic testing, and to further clarify her future cancer risks, as well as potential cancer risks for family members.   In March 2020, at the age of 34, Rhonda [name] was diagnosed with invasive ductal carcinoma of the right breast. The treatment plan lumpectomy and radiation.    CANCER HISTORY:  Oncology History   No history exists.     RISK FACTORS:  Menarche was at age 49.  First live birth at age 23.  OCP use for approximately 0 years.  Ovaries intact: yes.  Hysterectomy: no.  Menopausal status: postmenopausal.  HRT use: 0 years. Colonoscopy: yes; normal. Mammogram within the last year: yes. Number of breast biopsies: 1. Up to date with pelvic exams: yes. Any excessive radiation exposure in the past: no  Past Medical History:  Diagnosis Date  . Allergy    seasonal  . Anxiety   . Cancer ([facility]) 02/2019   right breast IDC  . Family history of breast cancer   . Family history of prostate cancer   . Fibroid   . Glaucoma   . Leg pain, bilateral   . Lung nodules    benign  . Other and unspecified hyperlipidemia 04/05/2013  . Peripheral vascular disease ([facility])    chronic venous insufficiency bilaterally  . PONV (postoperative nausea and vomiting)   . Varicose veins of both lower extremities     Past Surgical History:  Procedure  Laterality Date  . BLADDER SUSPENSION  2010   Dr.Silva  . BREAST CYST ASPIRATION Right 09/08/2012  . BREAST LUMPECTOMY WITH RADIOACTIVE SEED AND SENTINEL LYMPH NODE BIOPSY Right 03/22/2019   Procedure: RIGHT BREAST LUMPECTOMY WITH RADIOACTIVE SEED AND RIGHT AXILLARY SENTINEL LYMPH NODE BIOPSY;  Surgeon: Alphonsa Overall, MD;  Location: [facility];  Service: General;  Laterality: Right;  . INSERTION OF MESH N/A 11/01/2016   Procedure: INSERTION OF MESH;  Surgeon: Jackolyn Confer, MD;  Location: [facility];  Service: General;  Laterality: N/A;  . RHINOPLASTY    . TONSILLECTOMY    . varicose veins    . [place]  . VENTRAL HERNIA REPAIR N/A 11/01/2016   Procedure: VENTRAL HERNIA REPAIR WITH MESH;  Surgeon: Jackolyn Confer, MD;  Location: [facility];  Service: General;  Laterality: N/A;    Social History   Socioeconomic History  . Marital status: Married    Spouse name: Not on file  . Number of children: Not on file  . Years of education: Not on file  . Highest education level: Not on file  Occupational History  . Occupation: Registered Archivist: UNEMPLOYED  Social Needs  . Financial resource strain: Not on file  . Food insecurity    Worry: Not on file    Inability: Not on file  . Transportation needs  Medical: Not on file    Non-medical: Not on file  Tobacco Use  . Smoking status: Never Smoker  . Smokeless tobacco: Never Used  Substance and Sexual Activity  . Alcohol use: Yes    Alcohol/week: 7.0 standard drinks    Types: 7 Glasses of wine per week    Comment: 3-4 glasses of wine a week  . Drug use: No  . Sexual activity: Not Currently    Partners: Male    Birth control/protection: Post-menopausal  Lifestyle  . Physical activity    Days per week: Not on file    Minutes per session: Not on file  . Stress: Not on file  Relationships  . Social Herbalist on phone: Not on file    Gets together: Not on file     Attends religious service: Not on file    Active member of club or organization: Not on file    Attends meetings of clubs or organizations: Not on file    Relationship status: Not on file  Other Topics Concern  . Not on file  Social History Narrative   Married. Education: college. Exercise: walk for 1 hour everyday.     FAMILY HISTORY:  We obtained a detailed, 4-generation family history.  Significant diagnoses are listed below: Family History  Problem Relation Age of Onset  . Cancer Maternal Grandmother        breast cancer  . Breast cancer Maternal Grandmother        unsure of age  . Heart disease Maternal Grandfather   . Stroke Brother   . Alcohol abuse Brother   . Liver disease Brother   . Varicose Veins Brother   . Heart disease Brother   . Cancer Mother   . Tracheal cancer Mother   . Varicose Veins Father   . Heart disease Father   . Prostate cancer Father   . Heart attack Brother   . Mental illness Brother        d. 62  . Varicose Veins Paternal Grandmother   . Prostate cancer Brother        possible, unsure if BPA vs prostate cancer    The patient has two sons who are cancer free.  She has one sister and four brothers.  Her sister was hit by a car at age 10 and died.  One brother died from complications of alcoholism, a second brother died from a heart attack and complications of mental illness, and a third brother died from many causes, one which is possibly prostate cancer. The third brother is reportedly healthy.  Both parents are deceased.  The patient's mother had tracheal cancer.  She was an only child.  Her parents died of natural causes.  The patient's father had prostate cancer and died at 4.  He had three sisters who are reportedly cancer free.  The paternal grandparents died of non-cancer related issues.  Rhonda [name] is unaware of previous family history of genetic testing for hereditary cancer risks. Patient's maternal ancestors are of [place], [place],  [place] and [place] descent, and paternal ancestors are of [place] and [place] descent. There is no reported Ashkenazi Jewish ancestry. There is no known consanguinity.  GENETIC COUNSELING ASSESSMENT: Rhonda [name] is a 58 y.o. female with a personal and family history of cancer which is somewhat suggestive of a hereditary cancer syndrome and predisposition to cancer. We, therefore, discussed and recommended the following at today's visit.   DISCUSSION: We discussed that 5 -  10% of breast cancer is hereditary, with most cases associated with BRCA mutations.  There are other genes that can be associated with hereditary breast cancer syndromes.  These include ATM, CHEK2 and PALB2.  We discussed that testing is beneficial for several reasons including knowing how to follow individuals after completing their treatment, identifying whether potential treatment options such as PARP inhibitors would be beneficial, and understand if other family members could be at risk for cancer and allow them to undergo genetic testing.   We reviewed the characteristics, features and inheritance patterns of hereditary cancer syndromes. We also discussed genetic testing, including the appropriate family members to test, the process of testing, insurance coverage and turn-around-time for results. We discussed the implications of a negative, positive and/or variant of uncertain significant result. We recommended Rhonda [name] pursue genetic testing for the common hereditary cancer gene panel. The Common Hereditary Gene Panel offered by [facility] includes sequencing and/or deletion duplication testing of the following 48 genes: APC, ATM, AXIN2, BARD1, BMPR1A, BRCA1, BRCA2, BRIP1, CDH1, CDK4, CDKN2A (p14ARF), CDKN2A (p16INK4a), CHEK2, CTNNA1, DICER1, EPCAM (Deletion/duplication testing only), GREM1 (promoter region deletion/duplication testing only), KIT, MEN1, MLH1, MSH2, MSH3, MSH6, MUTYH, NBN, NF1, NHTL1, PALB2, PDGFRA, PMS2, POLD1, POLE, PTEN,  RAD50, RAD51C, RAD51D, RNF43, SDHB, SDHC, SDHD, SMAD4, SMARCA4. STK11, TP53, TSC1, TSC2, and VHL.  The following genes were evaluated for sequence changes only: SDHA and HOXB13 c.251G>A variant only.   Based on Rhonda [name]'s personal and family history of cancer, she meets medical criteria for genetic testing. Despite that she meets criteria, she may still have an out of pocket cost. We discussed that if her out of pocket cost for testing is over $100, the laboratory will call and confirm whether she wants to proceed with testing.  If the out of pocket cost of testing is less than $100 she will be billed by the genetic testing laboratory.   PLAN: After considering the risks, benefits, and limitations, Rhonda [name] provided informed consent to pursue genetic testing and the blood sample was sent to [facility] for analysis of the common hereditary cancer panel. Results should be available within approximately 2-3 weeks' time, at which point they will be disclosed by telephone to Rhonda [name], as will any additional recommendations warranted by these results. Rhonda [name] will receive a summary of her genetic counseling visit and a copy of her results once available. This information will also be available in Epic.   Lastly, we encouraged Rhonda [name] to remain in contact with cancer genetics annually so that we can continuously update the family history and inform her of any changes in cancer genetics and testing that may be of benefit for this family.   Rhonda [name] questions were answered to her satisfaction today. Our contact information was provided should additional questions or concerns arise. Thank you for the referral and allowing [place] to share in the care of your patient.   Lysha Schrade P. Florene Glen, [place], [facility] Certified Genetic Counselor [email or web address] phone: [phone]  The patient was seen for a total of 55 minutes in face-to-face genetic counseling.  This patient was discussed with Drs. Magrinat,  Lindi Adie and/or Burr Medico who agrees with the above.    _______________________________________________________________________ For Office Staff:  Number of people involved in session: 1 Was an Intern/ student involved with case: no

## 2019-06-10 ENCOUNTER — Telehealth: Payer: Self-pay | Admitting: Oncology

## 2019-06-10 ENCOUNTER — Other Ambulatory Visit: Payer: Self-pay

## 2019-06-10 ENCOUNTER — Ambulatory Visit
Admission: RE | Admit: 2019-06-10 | Discharge: 2019-06-10 | Disposition: A | Discharge status: Home / Self Care | Payer: BC Managed Care – PPO | Source: Ambulatory Visit | Attending: Radiation Oncology | Admitting: Radiation Oncology

## 2019-06-10 DIAGNOSIS — C50211 Malignant neoplasm of upper-inner quadrant of right female breast: Secondary | ICD-10-CM | POA: Diagnosis not present

## 2019-06-10 DIAGNOSIS — Z170 Estrogen receptor positive status [ER+]: Secondary | ICD-10-CM | POA: Diagnosis not present

## 2019-06-10 NOTE — Telephone Encounter (Signed)
I left a message and I will mail

## 2019-06-11 ENCOUNTER — Ambulatory Visit
Admission: RE | Admit: 2019-06-11 | Discharge: 2019-06-11 | Disposition: A | Discharge status: Home / Self Care | Payer: BC Managed Care – PPO | Source: Ambulatory Visit | Attending: Radiation Oncology | Admitting: Radiation Oncology

## 2019-06-11 ENCOUNTER — Other Ambulatory Visit: Payer: Self-pay

## 2019-06-11 DIAGNOSIS — Z170 Estrogen receptor positive status [ER+]: Secondary | ICD-10-CM | POA: Diagnosis not present

## 2019-06-11 DIAGNOSIS — C50211 Malignant neoplasm of upper-inner quadrant of right female breast: Secondary | ICD-10-CM | POA: Diagnosis not present

## 2019-06-14 ENCOUNTER — Other Ambulatory Visit: Payer: Self-pay

## 2019-06-14 ENCOUNTER — Ambulatory Visit
Admission: RE | Admit: 2019-06-14 | Discharge: 2019-06-14 | Disposition: A | Discharge status: Home / Self Care | Payer: BC Managed Care – PPO | Source: Ambulatory Visit | Attending: Radiation Oncology | Admitting: Radiation Oncology

## 2019-06-14 DIAGNOSIS — Z170 Estrogen receptor positive status [ER+]: Secondary | ICD-10-CM | POA: Diagnosis not present

## 2019-06-14 DIAGNOSIS — C50211 Malignant neoplasm of upper-inner quadrant of right female breast: Secondary | ICD-10-CM | POA: Diagnosis not present

## 2019-06-15 ENCOUNTER — Other Ambulatory Visit: Payer: Self-pay

## 2019-06-15 ENCOUNTER — Ambulatory Visit
Admission: RE | Admit: 2019-06-15 | Discharge: 2019-06-15 | Disposition: A | Discharge status: Home / Self Care | Payer: BC Managed Care – PPO | Source: Ambulatory Visit | Attending: Radiation Oncology | Admitting: Radiation Oncology

## 2019-06-15 DIAGNOSIS — Z170 Estrogen receptor positive status [ER+]: Secondary | ICD-10-CM | POA: Diagnosis not present

## 2019-06-15 DIAGNOSIS — C50211 Malignant neoplasm of upper-inner quadrant of right female breast: Secondary | ICD-10-CM | POA: Diagnosis not present

## 2019-06-16 ENCOUNTER — Ambulatory Visit
Admission: RE | Admit: 2019-06-16 | Discharge: 2019-06-16 | Disposition: A | Discharge status: Home / Self Care | Payer: BC Managed Care – PPO | Source: Ambulatory Visit | Attending: Radiation Oncology | Admitting: Radiation Oncology

## 2019-06-16 ENCOUNTER — Other Ambulatory Visit: Payer: Self-pay

## 2019-06-16 DIAGNOSIS — Z170 Estrogen receptor positive status [ER+]: Secondary | ICD-10-CM | POA: Insufficient documentation

## 2019-06-16 DIAGNOSIS — C50211 Malignant neoplasm of upper-inner quadrant of right female breast: Secondary | ICD-10-CM | POA: Insufficient documentation

## 2019-06-17 ENCOUNTER — Ambulatory Visit
Admission: RE | Admit: 2019-06-17 | Discharge: 2019-06-17 | Disposition: A | Discharge status: Home / Self Care | Payer: BC Managed Care – PPO | Source: Ambulatory Visit | Attending: Radiation Oncology | Admitting: Radiation Oncology

## 2019-06-17 ENCOUNTER — Other Ambulatory Visit: Payer: Self-pay

## 2019-06-17 DIAGNOSIS — Z170 Estrogen receptor positive status [ER+]: Secondary | ICD-10-CM | POA: Diagnosis not present

## 2019-06-17 DIAGNOSIS — C50211 Malignant neoplasm of upper-inner quadrant of right female breast: Secondary | ICD-10-CM | POA: Diagnosis not present

## 2019-06-21 ENCOUNTER — Ambulatory Visit
Admission: RE | Admit: 2019-06-21 | Discharge: 2019-06-21 | Disposition: A | Discharge status: Home / Self Care | Payer: BC Managed Care – PPO | Source: Ambulatory Visit | Attending: Radiation Oncology | Admitting: Radiation Oncology

## 2019-06-21 ENCOUNTER — Other Ambulatory Visit: Payer: Self-pay

## 2019-06-21 DIAGNOSIS — Z170 Estrogen receptor positive status [ER+]: Secondary | ICD-10-CM | POA: Diagnosis not present

## 2019-06-21 DIAGNOSIS — C50211 Malignant neoplasm of upper-inner quadrant of right female breast: Secondary | ICD-10-CM | POA: Diagnosis not present

## 2019-06-22 ENCOUNTER — Other Ambulatory Visit: Payer: Self-pay

## 2019-06-22 ENCOUNTER — Ambulatory Visit
Admission: RE | Admit: 2019-06-22 | Discharge: 2019-06-22 | Disposition: A | Discharge status: Home / Self Care | Payer: BC Managed Care – PPO | Source: Ambulatory Visit | Attending: Radiation Oncology | Admitting: Radiation Oncology

## 2019-06-22 DIAGNOSIS — Z170 Estrogen receptor positive status [ER+]: Secondary | ICD-10-CM | POA: Diagnosis not present

## 2019-06-22 DIAGNOSIS — C50211 Malignant neoplasm of upper-inner quadrant of right female breast: Secondary | ICD-10-CM | POA: Diagnosis not present

## 2019-06-23 ENCOUNTER — Ambulatory Visit
Admission: RE | Admit: 2019-06-23 | Discharge: 2019-06-23 | Disposition: A | Discharge status: Home / Self Care | Payer: BC Managed Care – PPO | Source: Ambulatory Visit | Attending: Radiation Oncology | Admitting: Radiation Oncology

## 2019-06-23 ENCOUNTER — Other Ambulatory Visit: Payer: Self-pay

## 2019-06-23 DIAGNOSIS — Z170 Estrogen receptor positive status [ER+]: Secondary | ICD-10-CM | POA: Diagnosis not present

## 2019-06-23 DIAGNOSIS — C50211 Malignant neoplasm of upper-inner quadrant of right female breast: Secondary | ICD-10-CM | POA: Diagnosis not present

## 2019-06-24 ENCOUNTER — Other Ambulatory Visit: Payer: Self-pay

## 2019-06-24 ENCOUNTER — Ambulatory Visit
Admission: RE | Admit: 2019-06-24 | Discharge: 2019-06-24 | Disposition: A | Discharge status: Home / Self Care | Payer: BC Managed Care – PPO | Source: Ambulatory Visit | Attending: Radiation Oncology | Admitting: Radiation Oncology

## 2019-06-24 DIAGNOSIS — Z170 Estrogen receptor positive status [ER+]: Secondary | ICD-10-CM | POA: Diagnosis not present

## 2019-06-24 DIAGNOSIS — C50211 Malignant neoplasm of upper-inner quadrant of right female breast: Secondary | ICD-10-CM | POA: Diagnosis not present

## 2019-06-25 ENCOUNTER — Ambulatory Visit
Admission: RE | Admit: 2019-06-25 | Discharge: 2019-06-25 | Disposition: A | Discharge status: Home / Self Care | Payer: BC Managed Care – PPO | Source: Ambulatory Visit | Attending: Radiation Oncology | Admitting: Radiation Oncology

## 2019-06-25 ENCOUNTER — Other Ambulatory Visit: Payer: Self-pay

## 2019-06-25 DIAGNOSIS — C50211 Malignant neoplasm of upper-inner quadrant of right female breast: Secondary | ICD-10-CM | POA: Diagnosis not present

## 2019-06-25 DIAGNOSIS — Z170 Estrogen receptor positive status [ER+]: Secondary | ICD-10-CM | POA: Diagnosis not present

## 2019-06-28 ENCOUNTER — Other Ambulatory Visit: Payer: Self-pay

## 2019-06-28 ENCOUNTER — Ambulatory Visit
Admission: RE | Admit: 2019-06-28 | Discharge: 2019-06-28 | Disposition: A | Discharge status: Home / Self Care | Payer: BC Managed Care – PPO | Source: Ambulatory Visit | Attending: Radiation Oncology | Admitting: Radiation Oncology

## 2019-06-28 DIAGNOSIS — C50211 Malignant neoplasm of upper-inner quadrant of right female breast: Secondary | ICD-10-CM | POA: Diagnosis not present

## 2019-06-28 DIAGNOSIS — Z170 Estrogen receptor positive status [ER+]: Secondary | ICD-10-CM | POA: Diagnosis not present

## 2019-06-29 ENCOUNTER — Other Ambulatory Visit: Payer: Self-pay

## 2019-06-29 ENCOUNTER — Ambulatory Visit
Admission: RE | Admit: 2019-06-29 | Discharge: 2019-06-29 | Disposition: A | Discharge status: Home / Self Care | Payer: BC Managed Care – PPO | Source: Ambulatory Visit | Attending: Radiation Oncology | Admitting: Radiation Oncology

## 2019-06-29 ENCOUNTER — Ambulatory Visit: Payer: BC Managed Care – PPO | Admitting: Radiation Oncology

## 2019-06-29 DIAGNOSIS — C50211 Malignant neoplasm of upper-inner quadrant of right female breast: Secondary | ICD-10-CM | POA: Diagnosis not present

## 2019-06-29 DIAGNOSIS — Z170 Estrogen receptor positive status [ER+]: Secondary | ICD-10-CM

## 2019-06-29 MED ORDER — RADIAPLEXRX EX GEL
Freq: Once | CUTANEOUS | Status: AC
  Administered 2019-06-29: 13:00:00 via TOPICAL

## 2019-06-30 ENCOUNTER — Ambulatory Visit
Admission: RE | Admit: 2019-06-30 | Discharge: 2019-06-30 | Disposition: A | Discharge status: Home / Self Care | Payer: BC Managed Care – PPO | Source: Ambulatory Visit | Attending: Radiation Oncology | Admitting: Radiation Oncology

## 2019-06-30 ENCOUNTER — Other Ambulatory Visit: Payer: Self-pay

## 2019-06-30 DIAGNOSIS — C50211 Malignant neoplasm of upper-inner quadrant of right female breast: Secondary | ICD-10-CM | POA: Diagnosis not present

## 2019-06-30 DIAGNOSIS — Z170 Estrogen receptor positive status [ER+]: Secondary | ICD-10-CM | POA: Diagnosis not present

## 2019-07-01 ENCOUNTER — Ambulatory Visit
Admission: RE | Admit: 2019-07-01 | Discharge: 2019-07-01 | Disposition: A | Discharge status: Home / Self Care | Payer: BC Managed Care – PPO | Source: Ambulatory Visit | Attending: Radiation Oncology | Admitting: Radiation Oncology

## 2019-07-01 ENCOUNTER — Other Ambulatory Visit: Payer: Self-pay

## 2019-07-01 DIAGNOSIS — C50211 Malignant neoplasm of upper-inner quadrant of right female breast: Secondary | ICD-10-CM | POA: Diagnosis not present

## 2019-07-01 DIAGNOSIS — Z170 Estrogen receptor positive status [ER+]: Secondary | ICD-10-CM | POA: Diagnosis not present

## 2019-07-02 ENCOUNTER — Other Ambulatory Visit: Payer: Self-pay

## 2019-07-02 ENCOUNTER — Other Ambulatory Visit: Payer: Self-pay | Admitting: Genetic Counselor

## 2019-07-02 ENCOUNTER — Telehealth: Payer: Self-pay | Admitting: Genetic Counselor

## 2019-07-02 ENCOUNTER — Ambulatory Visit
Admission: RE | Admit: 2019-07-02 | Discharge: 2019-07-02 | Disposition: A | Discharge status: Home / Self Care | Payer: BC Managed Care – PPO | Source: Ambulatory Visit | Attending: Radiation Oncology | Admitting: Radiation Oncology

## 2019-07-02 DIAGNOSIS — Z170 Estrogen receptor positive status [ER+]: Secondary | ICD-10-CM

## 2019-07-02 DIAGNOSIS — C50211 Malignant neoplasm of upper-inner quadrant of right female breast: Secondary | ICD-10-CM | POA: Diagnosis not present

## 2019-07-02 NOTE — Telephone Encounter (Signed)
Called patient to let her know that the sample will be delayed due to the TRF having a discrepant DOB to the blood.  The lab has updated the TRF and will start her sample.  Therefore, testing will be delayed.  Patient voiced her understanding.

## 2019-07-05 ENCOUNTER — Ambulatory Visit
Admission: RE | Admit: 2019-07-05 | Discharge: 2019-07-05 | Disposition: A | Discharge status: Home / Self Care | Payer: BC Managed Care – PPO | Source: Ambulatory Visit | Attending: Radiation Oncology | Admitting: Radiation Oncology

## 2019-07-05 ENCOUNTER — Inpatient Hospital Stay: Payer: BC Managed Care – PPO | Attending: Oncology

## 2019-07-05 ENCOUNTER — Other Ambulatory Visit: Payer: Self-pay

## 2019-07-05 DIAGNOSIS — Z170 Estrogen receptor positive status [ER+]: Secondary | ICD-10-CM

## 2019-07-05 DIAGNOSIS — C50211 Malignant neoplasm of upper-inner quadrant of right female breast: Secondary | ICD-10-CM | POA: Diagnosis not present

## 2019-07-06 ENCOUNTER — Other Ambulatory Visit: Payer: Self-pay

## 2019-07-06 ENCOUNTER — Ambulatory Visit
Admission: RE | Admit: 2019-07-06 | Discharge: 2019-07-06 | Disposition: A | Discharge status: Home / Self Care | Payer: BC Managed Care – PPO | Source: Ambulatory Visit | Attending: Radiation Oncology | Admitting: Radiation Oncology

## 2019-07-06 DIAGNOSIS — Z803 Family history of malignant neoplasm of breast: Secondary | ICD-10-CM | POA: Diagnosis not present

## 2019-07-06 DIAGNOSIS — Z170 Estrogen receptor positive status [ER+]: Secondary | ICD-10-CM | POA: Diagnosis not present

## 2019-07-06 DIAGNOSIS — Z8042 Family history of malignant neoplasm of prostate: Secondary | ICD-10-CM | POA: Diagnosis not present

## 2019-07-06 DIAGNOSIS — C50211 Malignant neoplasm of upper-inner quadrant of right female breast: Secondary | ICD-10-CM | POA: Diagnosis not present

## 2019-07-07 ENCOUNTER — Ambulatory Visit
Admission: RE | Admit: 2019-07-07 | Discharge: 2019-07-07 | Disposition: A | Discharge status: Home / Self Care | Payer: BC Managed Care – PPO | Source: Ambulatory Visit | Attending: Radiation Oncology | Admitting: Radiation Oncology

## 2019-07-07 ENCOUNTER — Other Ambulatory Visit: Payer: Self-pay

## 2019-07-07 DIAGNOSIS — Z170 Estrogen receptor positive status [ER+]: Secondary | ICD-10-CM | POA: Diagnosis not present

## 2019-07-07 DIAGNOSIS — C50211 Malignant neoplasm of upper-inner quadrant of right female breast: Secondary | ICD-10-CM | POA: Diagnosis not present

## 2019-07-08 ENCOUNTER — Telehealth: Payer: Self-pay | Admitting: Adult Health

## 2019-07-08 ENCOUNTER — Other Ambulatory Visit: Payer: Self-pay

## 2019-07-08 ENCOUNTER — Ambulatory Visit
Admission: RE | Admit: 2019-07-08 | Discharge: 2019-07-08 | Disposition: A | Discharge status: Home / Self Care | Payer: BC Managed Care – PPO | Source: Ambulatory Visit | Attending: Radiation Oncology | Admitting: Radiation Oncology

## 2019-07-08 ENCOUNTER — Encounter: Payer: Self-pay | Admitting: *Deleted

## 2019-07-08 DIAGNOSIS — C50211 Malignant neoplasm of upper-inner quadrant of right female breast: Secondary | ICD-10-CM | POA: Diagnosis not present

## 2019-07-08 DIAGNOSIS — Z170 Estrogen receptor positive status [ER+]: Secondary | ICD-10-CM | POA: Diagnosis not present

## 2019-07-08 NOTE — Telephone Encounter (Signed)
Scheduled appt per 7/23 sch message - unable to reach pt . Mailed letter with appt date and time

## 2019-07-12 ENCOUNTER — Encounter: Payer: Self-pay | Admitting: Radiation Oncology

## 2019-07-12 NOTE — Progress Notes (Incomplete)
Patient Name: Rhonda [name] MRN: [id number] DOB: 12/31/1960 Referring Physician: Emi Belfast (Profile Not Attached) Date of Service: 07/08/2019 [facility], [place]                                                        End Of Treatment Note  Diagnoses: C50.211-Malignant neoplasm of upper-inner quadrant of right female breast  Cancer Staging: ClinicalStageIA(pT1c, pN0)RightBreast UIQ Invasive Ductal Carcinoma, ER+/ PR+/ Her2-, Grade1  Intent: Curative  Radiation Treatment Dates: 05/24/2019 through 07/08/2019 Site Technique Total Dose Dose per Fx Completed Fx Beam Energies  Breast: Breast_Rt 3D 50.4/50.4 1.8 28/28 6X  Breast: Breast_Rt_Bst 3D 10/10 2 5/5 6X, 10X   Narrative: The patient tolerated radiation therapy relatively well. By the end of treatment, she reported mild fatigue and occasional shooting pain and tingling in the right breast. The right breast area showed erythema but no skin breakdown. A small amount of radiation dermatitis was present. Brest was minimally swollen.  Plan: The patient will follow-up with radiation oncology in 1 month.  ________________________________________________ -----------------------------------  Blair Promise, PhD, MD This document serves as a record of services personally performed by Gery Pray, MD. It was created on his behalf by Mary-Margaret Loma Messing, a trained medical scribe. The creation of this record is based on the scribe's personal observations and the provider's statements to them. This document has been checked and approved by the attending provider.

## 2019-07-19 ENCOUNTER — Ambulatory Visit: Payer: Self-pay | Admitting: Genetic Counselor

## 2019-07-19 ENCOUNTER — Encounter: Payer: Self-pay | Admitting: Genetic Counselor

## 2019-07-19 ENCOUNTER — Telehealth: Payer: Self-pay | Admitting: Genetic Counselor

## 2019-07-19 DIAGNOSIS — Z1379 Encounter for other screening for genetic and chromosomal anomalies: Secondary | ICD-10-CM | POA: Insufficient documentation

## 2019-07-19 NOTE — Progress Notes (Signed)
HPI:  Ms. Whidby was previously seen in the [facility] clinic due to a personal and family history of cancer and concerns regarding a hereditary predisposition to cancer. Please refer to our prior cancer genetics clinic note for more information regarding our discussion, assessment and recommendations, at the time. Ms. Gatz recent genetic test results were disclosed to her, as were recommendations warranted by these results. These results and recommendations are discussed in more detail below.  CANCER HISTORY:  Oncology History  Malignant neoplasm of upper-inner quadrant of right breast in female, estrogen receptor positive ([facility])  03/01/2019 Initial Diagnosis   Malignant neoplasm of upper-inner quadrant of right breast in female, estrogen receptor positive ([facility])   07/19/2019 Genetic Testing   Negative genetic testing on the common hereditary cancer panel.  The Common Hereditary Gene Panel offered by [facility] includes sequencing and/or deletion duplication testing of the following 48 genes: APC, ATM, AXIN2, BARD1, BMPR1A, BRCA1, BRCA2, BRIP1, CDH1, CDK4, CDKN2A (p14ARF), CDKN2A (p16INK4a), CHEK2, CTNNA1, DICER1, EPCAM (Deletion/duplication testing only), GREM1 (promoter region deletion/duplication testing only), KIT, MEN1, MLH1, MSH2, MSH3, MSH6, MUTYH, NBN, NF1, NHTL1, PALB2, PDGFRA, PMS2, POLD1, POLE, PTEN, RAD50, RAD51C, RAD51D, RNF43, SDHB, SDHC, SDHD, SMAD4, SMARCA4. STK11, TP53, TSC1, TSC2, and VHL.  The following genes were evaluated for sequence changes only: SDHA and HOXB13 c.251G>A variant only. The report date is July 19, 2019.     FAMILY HISTORY:  We obtained a detailed, 4-generation family history.  Significant diagnoses are listed below: Family History  Problem Relation Age of Onset   Cancer Maternal Grandmother        breast cancer   Breast cancer Maternal Grandmother        unsure of age   Heart disease Maternal Grandfather    Stroke Brother    Alcohol abuse  Brother    Liver disease Brother    Varicose Veins Brother    Heart disease Brother    Cancer Mother    Tracheal cancer Mother    Varicose Veins Father    Heart disease Father    Prostate cancer Father    Heart attack Brother    Mental illness Brother        d. 66   Varicose Veins Paternal Grandmother    Prostate cancer Brother        possible, unsure if BPA vs prostate cancer    The patient has two sons who are cancer free.  She has one sister and four brothers.  Her sister was hit by a car at age 73 and died.  One brother died from complications of alcoholism, a second brother died from a heart attack and complications of mental illness, and a third brother died from many causes, one which is possibly prostate cancer. The third brother is reportedly healthy.  Both parents are deceased.  The patient's mother had tracheal cancer.  She was an only child.  Her parents died of natural causes.  The patient's father had prostate cancer and died at 23.  He had three sisters who are reportedly cancer free.  The paternal grandparents died of non-cancer related issues.  Ms. Mccandlish is unaware of previous family history of genetic testing for hereditary cancer risks. Patient's maternal ancestors are of [place], [place], [place] and [place] descent, and paternal ancestors are of [place] and [place] descent. There is no reported Ashkenazi Jewish ancestry. There is no known consanguinity.    GENETIC TEST RESULTS: Genetic testing reported out on July 19, 2019 through the  common hereditary cancer panel found no pathogenic mutations. The Common Hereditary Gene Panel offered by [facility] includes sequencing and/or deletion duplication testing of the following 48 genes: APC, ATM, AXIN2, BARD1, BMPR1A, BRCA1, BRCA2, BRIP1, CDH1, CDK4, CDKN2A (p14ARF), CDKN2A (p16INK4a), CHEK2, CTNNA1, DICER1, EPCAM (Deletion/duplication testing only), GREM1 (promoter region deletion/duplication testing only), KIT,  MEN1, MLH1, MSH2, MSH3, MSH6, MUTYH, NBN, NF1, NHTL1, PALB2, PDGFRA, PMS2, POLD1, POLE, PTEN, RAD50, RAD51C, RAD51D, RNF43, SDHB, SDHC, SDHD, SMAD4, SMARCA4. STK11, TP53, TSC1, TSC2, and VHL.  The following genes were evaluated for sequence changes only: SDHA and HOXB13 c.251G>A variant only. The test report has been scanned into EPIC and is located under the Molecular Pathology section of the Results Review tab.  A portion of the result report is included below for reference.     We discussed with Ms. Muise that because current genetic testing is not perfect, it is possible there may be a gene mutation in one of these genes that current testing cannot detect, but that chance is small.  We also discussed, that there could be another gene that has not yet been discovered, or that we have not yet tested, that is responsible for the cancer diagnoses in the family. It is also possible there is a hereditary cause for the cancer in the family that Ms. Oakland did not inherit and therefore was not identified in her testing.  Therefore, it is important to remain in touch with cancer genetics in the future so that we can continue to offer Ms. Intriago the most up to date genetic testing.   ADDITIONAL GENETIC TESTING: We discussed with Ms. Kea that there are other genes that are associated with increased cancer risk that can be analyzed. Should Ms. Tecson wish to pursue additional genetic testing, we are happy to discuss and coordinate this testing, at any time.    CANCER SCREENING RECOMMENDATIONS: Ms. Caloca test result is considered negative (normal).  This means that we have not identified a hereditary cause for her personal and family history of cancer at this time. Most cancers happen by chance and this negative test suggests that her cancer may fall into this category.    While reassuring, this does not definitively rule out a hereditary predisposition to cancer. It is still possible that there could be genetic  mutations that are undetectable by current technology. There could be genetic mutations in genes that have not been tested or identified to increase cancer risk.  Therefore, it is recommended she continue to follow the cancer management and screening guidelines provided by her oncology and primary healthcare provider.   An individual's cancer risk and medical management are not determined by genetic test results alone. Overall cancer risk assessment incorporates additional factors, including personal medical history, family history, and any available genetic information that may result in a personalized plan for cancer prevention and surveillance  RECOMMENDATIONS FOR FAMILY MEMBERS:  Individuals in this family might be at some increased risk of developing cancer, over the general population risk, simply due to the family history of cancer.  We recommended women in this family have a yearly mammogram beginning at age 66, or 53 years younger than the earliest onset of cancer, an annual clinical breast exam, and perform monthly breast self-exams. Women in this family should also have a gynecological exam as recommended by their primary provider. All family members should have a colonoscopy by age 31.  FOLLOW-UP: Lastly, we discussed with Ms. Garro that cancer genetics is a rapidly advancing field  and it is possible that new genetic tests will be appropriate for her and/or her family members in the future. We encouraged her to remain in contact with cancer genetics on an annual basis so we can update her personal and family histories and let her know of advances in cancer genetics that may benefit this family.   Our contact number was provided. Ms. Rawlinson questions were answered to her satisfaction, and she knows she is welcome to call us at anytime with additional questions or concerns.   Roma Kayser, [place], [facility] Licensed, Certified Genetic Counselor [email or web address]

## 2019-07-19 NOTE — Telephone Encounter (Signed)
Revealed negative genetic testing.  Discussed that we do not know why she has breast cancer or why there is cancer in the family. It could be due to a different gene that we are not testing, or maybe our current technology may not be able to pick something up.  It will be important for her to keep in contact with genetics to keep up with whether additional testing may be needed. 

## 2019-07-22 ENCOUNTER — Other Ambulatory Visit: Payer: Self-pay

## 2019-07-22 DIAGNOSIS — H401131 Primary open-angle glaucoma, bilateral, mild stage: Secondary | ICD-10-CM | POA: Diagnosis not present

## 2019-07-23 NOTE — Progress Notes (Signed)
58 y.o. G47P2012 Married Caucasian female here for annual exam.    Dx with breast cancer in the last year.   Taking anti-anxiety medication.   Good bladder control.  Brother died from a stroke during the pandemic.  No funeral due to Covid 19.   PCP: Emi Belfast, MD - Retired.   Patient's last menstrual period was 04/05/2016 (approximate).           Sexually active: No.  The current method of family planning is post menopausal status.    Exercising: Yes.    power walking. 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 She has never used smokeless tobacco. She reports current alcohol use of about 1.0 standard drinks of alcohol per week. She reports that she does not use drugs.  Past Medical History:  Diagnosis Date  . Allergy    seasonal  . Anxiety   . Cancer ([place]) 02/2019   right breast IDC  . Family history of breast cancer   . Family history of prostate cancer   . Fibroid   . Glaucoma   . Leg pain, bilateral   . Lung nodules    benign  . Other and unspecified hyperlipidemia 04/05/2013  . Peripheral vascular disease ([facility])    chronic venous insufficiency bilaterally  . PONV (postoperative nausea and vomiting)   . Varicose veins of both lower extremities     Past Surgical History:  Procedure Laterality Date  . BLADDER SUSPENSION  2010   Dr.Silva  . BREAST CYST ASPIRATION Right 09/08/2012  . BREAST LUMPECTOMY WITH RADIOACTIVE SEED AND SENTINEL LYMPH NODE BIOPSY Right 03/22/2019   Procedure: RIGHT BREAST LUMPECTOMY WITH RADIOACTIVE SEED AND RIGHT AXILLARY SENTINEL LYMPH NODE BIOPSY;   Surgeon: Alphonsa Overall, MD;  Location: [facility];  Service: General;  Laterality: Right;  . INSERTION OF MESH N/A 11/01/2016   Procedure: INSERTION OF MESH;  Surgeon: Jackolyn Confer, MD;  Location: [facility];  Service: General;  Laterality: N/A;  . RHINOPLASTY    . TONSILLECTOMY    . varicose veins    . [place]  . VENTRAL HERNIA REPAIR N/A 11/01/2016   Procedure: VENTRAL HERNIA REPAIR WITH MESH;  Surgeon: Jackolyn Confer, MD;  Location: [facility];  Service: General;  Laterality: N/A;    Current Outpatient Medications  Medication Sig Dispense Refill  . ACZONE 7.5 % GEL     . anastrozole (ARIMIDEX) 1 MG tablet Take 1 tablet (1 mg total) by mouth daily. 30 tablet 6  . Ascorbic Acid (VITAMIN C) 1000 MG tablet Take 1,000 mg by mouth daily.    Marland Kitchen lactobacillus acidophilus (BACID) TABS tablet Take 1 tablet by mouth 3 (three) times a week. Reported on 12/12/2015    . LORazepam (ATIVAN) 0.5 MG tablet Take 0.5 mg by mouth at bedtime.    . Magnesium 300 MG CAPS Take 1 capsule by mouth daily.    . multivitamin-lutein (OCUVITE-LUTEIN) CAPS Take 1 capsule by mouth daily.    Marland Kitchen OVER THE COUNTER MEDICATION Vitamin D 3 2000 iu taking in the a.m.    . OVER  THE COUNTER MEDICATION Flaxseed oil 1000 mg taking daily    . OVER THE COUNTER MEDICATION Epax fish oil Omega 3 taking daily    . TRAVATAN Z 0.004 % SOLN ophthalmic solution Place 1 drop into both eyes at bedtime.     No current facility-administered medications for this visit.     Family History  Problem Relation Age of Onset  . Cancer Maternal Grandmother        breast cancer  . Breast cancer Maternal Grandmother        unsure of age  . Heart disease Maternal Grandfather   . Stroke Brother   . Alcohol abuse Brother   . Liver disease Brother   . Varicose Veins Brother   . Heart disease Brother   . Cancer Mother   . Tracheal cancer Mother   . Varicose Veins Father   . Heart disease Father   . Prostate  cancer Father   . Heart attack Brother   . Mental illness Brother        d. 68  . Varicose Veins Paternal Grandmother   . Prostate cancer Brother        possible, unsure if BPA vs prostate cancer    Review of Systems  All other systems reviewed and are negative.   Exam:   BP 130/84   Pulse 100   Temp 98.8 F (37.1 C) (Temporal)   Resp 16   Ht 5\' 5"  (1.651 m)   Wt 160 lb 6.4 oz (72.8 kg)   LMP 04/05/2016 (Approximate)   BMI 26.69 kg/m     General appearance: alert, cooperative and appears stated age Head: normocephalic, without obvious abnormality, atraumatic Neck: no adenopathy, supple, symmetrical, trachea midline and thyroid normal to inspection and palpation Lungs: clear to auscultation bilaterally Breasts: normal appearance, no masses or tenderness, No nipple retraction or dimpling, No nipple discharge or bleeding, No axillary adenopathy Heart: regular rate and rhythm Abdomen: soft, non-tender; no masses, no organomegaly Extremities: extremities normal, atraumatic, no cyanosis or edema Skin: skin color, texture, turgor normal. No rashes or lesions Lymph nodes: cervical, supraclavicular, and axillary nodes normal. Neurologic: grossly normal  Pelvic: External genitalia:  no lesions              No abnormal inguinal nodes palpated.              Urethra:  normal appearing urethra with no masses, tenderness or lesions              Bartholins and Skenes: normal                 Vagina: normal appearing vagina with normal color and discharge, no lesions              Cervix: no lesions              Pap taken: No. Bimanual Exam:  Uterus:  normal size, contour, position, consistency, mobility, non-tender              Adnexa: no mass, fullness, tenderness              Rectal exam: Yes.  .  Confirms.              Anus:  normal sphincter tone, no lesions  Chaperone was present for exam.  Assessment:   Well woman visit with normal exam. Postmenopausal female. Hx of  mid-urethral sling.  Remote hx of ASCUS.  Follow up normal.  Remote hx of  osteopenia.Follow up BMD normal per patient. Hx varicose veins.  Right breast cancer.  Status post lumpectomy with sentinal node, and XRT.  On Arimidex. Negative genetic testing.   Plan: Mammogram screening discussed. Self breast awareness reviewed. Pap and HR HPV as above. Guidelines for Calcium, Vitamin D, regular exercise program including cardiovascular and weight bearing exercise. She will establish care with a new PCP.   Follow up annually and prn.   After visit summary provided.

## 2019-07-27 ENCOUNTER — Ambulatory Visit (INDEPENDENT_AMBULATORY_CARE_PROVIDER_SITE_OTHER): Payer: BC Managed Care – PPO | Admitting: Obstetrics and Gynecology

## 2019-07-27 ENCOUNTER — Encounter (HOSPITAL_COMMUNITY): Payer: Self-pay | Admitting: General Practice

## 2019-07-27 ENCOUNTER — Other Ambulatory Visit: Payer: Self-pay

## 2019-07-27 ENCOUNTER — Encounter: Payer: Self-pay | Admitting: Obstetrics and Gynecology

## 2019-07-27 VITALS — BP 130/84 | HR 100 | Temp 98.8°F | Resp 16 | Ht 65.0 in | Wt 160.4 lb

## 2019-07-27 DIAGNOSIS — Z01419 Encounter for gynecological examination (general) (routine) without abnormal findings: Secondary | ICD-10-CM | POA: Diagnosis not present

## 2019-07-27 NOTE — Progress Notes (Signed)
[facility] CSW Progress Notes  Call from patient, wants resources for counseling.  Returned call, left generic VM as mailbox was not personally identified.  Edwyna Shell, LCSW Clinical Social Worker Phone:  [phone] Cell:  [phone]

## 2019-07-27 NOTE — Patient Instructions (Signed)

## 2019-07-28 ENCOUNTER — Other Ambulatory Visit: Payer: Self-pay

## 2019-07-28 DIAGNOSIS — F419 Anxiety disorder, unspecified: Secondary | ICD-10-CM

## 2019-07-28 DIAGNOSIS — C50211 Malignant neoplasm of upper-inner quadrant of right female breast: Secondary | ICD-10-CM

## 2019-07-28 DIAGNOSIS — Z170 Estrogen receptor positive status [ER+]: Secondary | ICD-10-CM

## 2019-07-28 NOTE — Progress Notes (Signed)
Referral placed to [facility] for mental health therapy.

## 2019-08-12 ENCOUNTER — Other Ambulatory Visit: Payer: Self-pay

## 2019-08-12 ENCOUNTER — Encounter: Payer: Self-pay | Admitting: Radiation Oncology

## 2019-08-12 ENCOUNTER — Ambulatory Visit
Admission: RE | Admit: 2019-08-12 | Discharge: 2019-08-12 | Disposition: A | Discharge status: Home / Self Care | Payer: BC Managed Care – PPO | Source: Ambulatory Visit | Attending: Radiation Oncology | Admitting: Radiation Oncology

## 2019-08-12 VITALS — BP 130/82 | HR 75 | Temp 98.2°F | Resp 18 | Ht 65.0 in | Wt 162.2 lb

## 2019-08-12 DIAGNOSIS — C50211 Malignant neoplasm of upper-inner quadrant of right female breast: Secondary | ICD-10-CM | POA: Diagnosis not present

## 2019-08-12 DIAGNOSIS — Z923 Personal history of irradiation: Secondary | ICD-10-CM | POA: Insufficient documentation

## 2019-08-12 DIAGNOSIS — R5383 Other fatigue: Secondary | ICD-10-CM | POA: Insufficient documentation

## 2019-08-12 DIAGNOSIS — R351 Nocturia: Secondary | ICD-10-CM | POA: Insufficient documentation

## 2019-08-12 DIAGNOSIS — Z170 Estrogen receptor positive status [ER+]: Secondary | ICD-10-CM | POA: Diagnosis not present

## 2019-08-12 DIAGNOSIS — L299 Pruritus, unspecified: Secondary | ICD-10-CM | POA: Insufficient documentation

## 2019-08-12 DIAGNOSIS — Z79811 Long term (current) use of aromatase inhibitors: Secondary | ICD-10-CM | POA: Insufficient documentation

## 2019-08-12 NOTE — Progress Notes (Signed)
Radiation Oncology         [phone] ________________________________  Name: Rhonda [name] MRN: [id number]  Date: 08/12/2019  DOB: 08-18-1961  Follow-Up Visit Note  CC: Lanice Shirts, MD  Coralyn Mark Altamese Cabal, *    ICD-10-CM   1. Malignant neoplasm of upper-inner quadrant of right breast in female, estrogen receptor positive ([place])  C50.211    Z17.0     Diagnosis:   ClinicalStageIA(pT1c, pN0)RightBreast UIQ Invasive Ductal Carcinoma, ER+/ PR+/ Her2-, Grade1  Interval Since Last Radiation:  1 month 05/24/2019 - 07/08/2019: Right breast +boost / total of 60.4 Gy in 33 fractions  Narrative:  The patient returns today for routine follow-up.   She underwent genetics testing on 06/09/2019 (result date 07/19/2019). Results were negative for any pathogenic mutations.  On review of systems, she repots fatigue that is improving, nocturia x3-4, and itching to the breast. She endorses using radiaplex to the skin and hydrocortisone cream to the places that itch. She denies pain.  ALLERGIES:  is allergic to diflucan [fluconazole].  Meds: Current Outpatient Medications  Medication Sig Dispense Refill  . ACZONE 7.5 % GEL     . anastrozole (ARIMIDEX) 1 MG tablet Take 1 tablet (1 mg total) by mouth daily. 30 tablet 6  . Ascorbic Acid (VITAMIN C) 1000 MG tablet Take 1,000 mg by mouth daily.    Marland Kitchen lactobacillus acidophilus (BACID) TABS tablet Take 1 tablet by mouth 3 (three) times a week. Reported on 12/12/2015    . Magnesium 300 MG CAPS Take 1 capsule by mouth daily.    . multivitamin-lutein (OCUVITE-LUTEIN) CAPS Take 1 capsule by mouth daily.    Marland Kitchen OVER THE COUNTER MEDICATION Vitamin D 3 2000 iu taking in the a.m.    . OVER THE COUNTER MEDICATION Flaxseed oil 1000 mg taking daily    . OVER THE COUNTER MEDICATION Epax fish oil Omega 3 taking daily    . TRAVATAN Z 0.004 % SOLN ophthalmic solution Place 1 drop into both eyes at bedtime.    Marland Kitchen LORazepam (ATIVAN) 0.5 MG tablet Take 0.5  mg by mouth at bedtime.     No current facility-administered medications for this encounter.     Physical Findings: The patient is in no acute distress. Patient is alert and oriented.  height is _0  (1.651 m) and weight is 162 lb 4 oz (73.6 kg). Her temporal temperature is 98.2 F (36.8 C). Her blood pressure is 130/82 and her pulse is 75. Her respiration is 18 and oxygen saturation is 100%. .  Lungs are clear to auscultation bilaterally. Heart has regular rate and rhythm. No palpable cervical, supraclavicular, or axillary adenopathy. Abdomen soft, non-tender, normal bowel sounds.  The right breast area has healed well.  She has mild hyperpigmentation changes and mild erythema.  No dominant mass appreciated, no right breast nipple discharge or bleeding.  Lab Findings: Lab Results  Component Value Date   WBC 5.8 06/07/2019   HGB 15.1 (H) 06/07/2019   HCT 45.9 06/07/2019   MCV 91.6 06/07/2019   PLT 225 06/07/2019    Radiographic Findings: No results found.  Impression:  The patient is recovering from the effects of radiation.  No evidence of recurrence on clinical exam today.  Skin is healed well  Plan: PRN follow-up in radiation oncology.  Patient will continue regular follow-ups with Dr. Jana Hakim.  The patient continues on anastrozole.  She has had some nocturia and will speak with medical oncology to see if this may be  a potential side effect of her anastrozole.  ____________________________________ Gery Pray, MD   This document serves as a record of services personally performed by Gery Pray, MD. It was created on his behalf by Wilburn Mylar, a trained medical scribe. The creation of this record is based on the scribe's personal observations and the provider's statements to them. This document has been checked and approved by the attending provider.

## 2019-08-12 NOTE — Patient Instructions (Signed)
Coronavirus (COVID-19) Are you at risk?  Are you at risk for the Coronavirus (COVID-19)?  To be considered HIGH RISK for Coronavirus (COVID-19), you have to meet the following criteria:  . Traveled to [place], [place], [place], [place] or [place]; or in the [place] to [place], [place], [place], or [place]; and have fever, cough, and shortness of breath within the last 2 weeks of travel OR . Been in close contact with a person diagnosed with COVID-19 within the last 2 weeks and have fever, cough, and shortness of breath . IF YOU DO NOT MEET THESE CRITERIA, YOU ARE CONSIDERED LOW RISK FOR COVID-19.  What to do if you are HIGH RISK for COVID-19?  . If you are having a medical emergency, call 911. . Seek medical care right away. Before you go to a doctor's office, urgent care or emergency department, call ahead and tell them about your recent travel, contact with someone diagnosed with COVID-19, and your symptoms. You should receive instructions from your physician's office regarding next steps of care.  . When you arrive at healthcare provider, tell the healthcare staff immediately you have returned from visiting [place], [place], [place], [place] or [place]; or traveled in the [place] to [place], [place], [place], or [place]; in the last two weeks or you have been in close contact with a person diagnosed with COVID-19 in the last 2 weeks.   . Tell the health care staff about your symptoms: fever, cough and shortness of breath. . After you have been seen by a medical provider, you will be either: o Tested for (COVID-19) and discharged home on quarantine except to seek medical care if symptoms worsen, and asked to  - Stay home and avoid contact with others until you get your results (4-5 days)  - Avoid travel on public transportation if possible (such as bus, train, or airplane) or o Sent to the Emergency Department by EMS for evaluation, COVID-19 testing, and possible  admission depending on your condition and test results.  What to do if you are LOW RISK for COVID-19?  Reduce your risk of any infection by using the same precautions used for avoiding the common cold or flu:  . Wash your hands often with soap and warm water for at least 20 seconds.  If soap and water are not readily available, use an alcohol-based hand sanitizer with at least 60% alcohol.  . If coughing or sneezing, cover your mouth and nose by coughing or sneezing into the elbow areas of your shirt or coat, into a tissue or into your sleeve (not your hands). . Avoid shaking hands with others and consider head nods or verbal greetings only. . Avoid touching your eyes, nose, or mouth with unwashed hands.  . Avoid close contact with people who are sick. . Avoid places or events with large numbers of people in one location, like concerts or sporting events. . Carefully consider travel plans you have or are making. . If you are planning any travel outside or inside the [place], visit the CDC's Travelers' Health webpage for the latest health notices. . If you have some symptoms but not all symptoms, continue to monitor at home and seek medical attention if your symptoms worsen. . If you are having a medical emergency, call 911.   ADDITIONAL HEALTHCARE OPTIONS FOR PATIENTS  [facility] / e-Visit: [email or web address]         [facility]: [phone]  [facility]: [phone]                   [facility]: [phone]   

## 2019-08-12 NOTE — Progress Notes (Signed)
Pt presents today for f/u with Dr. Sondra Come. Pt denies c/o pain. Pt reports fatigue is improving, but reports nocturia 3-4 times per night. Pt continues to use Radiaplex on skin. 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 Pt reports using hydrocortisone cream as needed on that area.  BP 130/82 (BP Location: Left Arm, Patient Position: Sitting)   Pulse 75   Temp 98.2 F (36.8 C) (Temporal)   Resp 18   Ht 5\' 5"  (1.651 m)   Wt 162 lb 4 oz (73.6 kg)   LMP 04/05/2016 (Approximate)   SpO2 100%   BMI 27.00 kg/m   Wt Readings from Last 3 Encounters:  08/12/19 162 lb 4 oz (73.6 kg)  07/27/19 160 lb 6.4 oz (72.8 kg)  06/07/19 159 lb 11.2 oz (72.4 kg)   Loma Sousa, RN BSN

## 2019-08-13 ENCOUNTER — Encounter: Payer: Self-pay | Admitting: *Deleted

## 2019-08-13 ENCOUNTER — Telehealth: Payer: Self-pay | Admitting: *Deleted

## 2019-08-13 NOTE — Telephone Encounter (Signed)
Patient in MMG hold.  02/22/19: right breast biopsy Grade 1 DCIS S/p lumpectomy and radiation Patient is being followed by Oncology  Dr. Quincy Simmonds -ok to remove form MMG hold?

## 2019-08-16 NOTE — Telephone Encounter (Signed)
Ok to remove from mammogram hold.  

## 2019-08-16 NOTE — Telephone Encounter (Signed)
Patient removed from MMG hold.   Encounter closed.  

## 2019-09-13 ENCOUNTER — Other Ambulatory Visit: Payer: Self-pay | Admitting: Oncology

## 2019-09-16 DIAGNOSIS — C50911 Malignant neoplasm of unspecified site of right female breast: Secondary | ICD-10-CM | POA: Diagnosis not present

## 2019-09-16 DIAGNOSIS — Z170 Estrogen receptor positive status [ER+]: Secondary | ICD-10-CM | POA: Diagnosis not present

## 2019-09-17 ENCOUNTER — Telehealth: Payer: Self-pay

## 2019-09-17 ENCOUNTER — Telehealth: Payer: Self-pay | Admitting: *Deleted

## 2019-09-17 NOTE — Telephone Encounter (Signed)
Pt called to discuss SCP, will cancel at this time and r/s at a later date.

## 2019-09-17 NOTE — Telephone Encounter (Signed)
Returned TC to patient in regard to her question about her survivorship appointment scheduled on Oct 30th. Patient had concerns about coming into the office due to covid stating that she had just seen Dr. Pollie Friar office yesterday and that she feels fine. I let her know that Mendel Ryder will be out of the office that day anyway and that when they reschedule her appointment it will be a virtual visit to go over her survivorship care plan and follow up care plan as well. Patient verbalized understanding. No further problems or concerns at this time.

## 2019-10-09 DIAGNOSIS — Z23 Encounter for immunization: Secondary | ICD-10-CM | POA: Diagnosis not present

## 2019-10-15 ENCOUNTER — Encounter: Payer: BC Managed Care – PPO | Admitting: Adult Health

## 2019-11-15 ENCOUNTER — Encounter: Payer: Self-pay | Admitting: *Deleted

## 2019-11-17 NOTE — Progress Notes (Signed)
Rhonda [name]  Telephone:([phone]) [phone] Fax:([phone]) [phone]    ID: Drucie Ip DOB: 1961/02/07  MR#: [id number]  XKG#:[id number]  Patient Care Team: Lanice Shirts, MD as PCP - General (Internal Medicine) Mauro Kaufmann, RN as Oncology Nurse Navigator Rockwell Germany, RN as Oncology Nurse Navigator Alphonsa Overall, MD as Consulting Physician (General Surgery) Adriane Guglielmo, Virgie Dad, MD as Consulting Physician (Oncology) Gery Pray, MD as Consulting Physician (Radiation Oncology) Nunzio Cobbs, MD as Consulting Physician (Obstetrics and Gynecology) Macario Carls, MD as Referring Physician (Dermatology) Katy Apo, MD as Consulting Physician (Ophthalmology) Linus Mako, MD as Consulting Physician (Vascular Surgery) OTHER MD:   CHIEF COMPLAINT: Estrogen receptor positive breast cancer  CURRENT TREATMENT: anastrozole   INTERVAL HISTORY: Kaytlynne returns today for follow-up and treatment of her estrogen receptor positive breast cancer.  She continues on anastrozole.  The nighttime hot flashes have improved.  She does not have arthralgias or myalgias from this medication.  She completed adjuvant radiation on 07/08/2019.  The erythema has subsided but she still has a little bit of itching in that breast.  She is using an aloe cream and that is helping some.  Since her last visit, she underwent genetic testing on 06/09/2019, which was negative.  She is scheduled for repeat mammography on 02/14/2020 at [facility].  She does not have a bone density on file with [place]. An order is in place for one, but this has not yet been scheduled.   REVIEW OF SYSTEMS: Ila is still grieving for her sister, who was killed by a reckless driver and to make matters worse he never went to jail (he was a Engineer, structural).  She has told her son who lives in [place] not to come for Christmas and that is the right decision but it is a painful 1 as well.  She is exercising  regularly, walking 3-1/2 miles most days.  She is taking vitamin D.  She is concerned about radiation for her bone density scan which is scheduled for March  Aside from these issues a detailed review of systems today was stable   HISTORY OF CURRENT ILLNESS: From the original intake note:  Drucie Ip had routine screening mammography on 02/12/2019 showing a possible abnormality in the right breast. She underwent unilateral right diagnostic mammography with tomography and right breast ultrasonography at [facility] on 02/17/2019 showing: Breast Density Category C. a persistent irregular mass measuring approximately 0.8 cm. Ultrasound of the right breast at 12 o'clock, 5 cm from the nipple demonstrates an irregular hypoechoic mass with indistinct margins measuring 0.7 x 0.5 x 0.7 cm. No blood flow seen within the mass on color Doppler imaging. Ultrasound of the right axilla demonstrates multiple normal-appearing nodes.  Accordingly on 02/22/2019 she proceeded to biopsy of the right breast area in question. 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 Prognostic indicators significant for: estrogen receptor, 100% positive and progesterone receptor, 100% positive, both with strong staining intensity. Proliferation marker Ki67 at 1%. HER2 equivocal (2+) by immunohistochemistry but negative by fluorescent in situ hybridization with a signals ratio 1.32 and number per cell 1.85.   The patient's subsequent history is as detailed below.   PAST MEDICAL HISTORY: Past Medical History:  Diagnosis Date   Allergy    seasonal   Anxiety    Cancer ([facility]) 02/2019   right breast IDC   Family history of breast cancer  Family history of prostate cancer    Fibroid    Glaucoma    Leg pain, bilateral    Lung nodules    benign   Other and unspecified hyperlipidemia 04/05/2013   Peripheral vascular disease ([facility])    chronic  venous insufficiency bilaterally   PONV (postoperative nausea and vomiting)    Varicose veins of both lower extremities     PAST SURGICAL HISTORY: Past Surgical History:  Procedure Laterality Date   BLADDER SUSPENSION  2010   Dr.Silva   BREAST CYST ASPIRATION Right 09/08/2012   BREAST LUMPECTOMY WITH RADIOACTIVE SEED AND SENTINEL LYMPH NODE BIOPSY Right 03/22/2019   Procedure: RIGHT BREAST LUMPECTOMY WITH RADIOACTIVE SEED AND RIGHT AXILLARY SENTINEL LYMPH NODE BIOPSY;  Surgeon: Alphonsa Overall, MD;  Location: [facility];  Service: General;  Laterality: Right;   INSERTION OF MESH N/A 11/01/2016   Procedure: INSERTION OF MESH;  Surgeon: Jackolyn Confer, MD;  Location: [facility];  Service: General;  Laterality: N/A;   RHINOPLASTY     TONSILLECTOMY     varicose veins     VEIN LIGATION AND STRIPPING  1995   VENTRAL HERNIA REPAIR N/A 11/01/2016   Procedure: VENTRAL HERNIA REPAIR WITH MESH;  Surgeon: Jackolyn Confer, MD;  Location: [facility];  Service: General;  Laterality: N/A;    FAMILY HISTORY: Family History  Problem Relation Age of Onset   Cancer Maternal Grandmother        breast cancer   Breast cancer Maternal Grandmother        unsure of age   Heart disease Maternal Grandfather    Stroke Brother    Alcohol abuse Brother    Liver disease Brother    Varicose Veins Brother    Heart disease Brother    Cancer Mother    Tracheal cancer Mother    Varicose Veins Father    Heart disease Father    Prostate cancer Father    Heart attack Brother    Mental illness Brother        d. 62   Varicose Veins Paternal Grandmother    Prostate cancer Brother        possible, unsure if BPA vs prostate cancer   Shirl's father died from congestive heart failure at age 65. Patients' mother died from tracheal carcinoma at age 70. The patient has 4 brothers and 1 sister. Patient denies anyone in her family having ovarian, or pancreatic cancer. Angline's mother was  diagnosed with tracheal carcinoma and acoustic neuroma at age 60. Cyriah's father was diagnosed with prostate cancer in his 70's. Adajah's brother was diagnosed with prostate cancer in his mid 18's. Mayola's maternal grandmother was diagnosed with breast cancer in her late 78's.    GYNECOLOGIC HISTORY:  Patient's last menstrual period was 04/05/2016 (approximate). 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 Her husband, Richardson Landry, is a retired Community education officer for [employer]. They have two children, Copywriter, advertising and [employer]. Remo Lipps is 2, lives in [place], [place], and is a music professor. Cherly Beach is 86, lives in [place], [place], and recently graduated with for business. Our [employer] of [employer].   ADVANCED DIRECTIVES: In the absence of any documentation, Danyle's spouse, Richardson Landry, is her healthcare power of attorney.      HEALTH MAINTENANCE:  Social History   Tobacco Use   Smoking status: Never Smoker   Smokeless tobacco: Never Used  Substance Use Topics   Alcohol use: Yes    Alcohol/week: 1.0 standard drinks    Types: 1 Glasses of wine per week   Drug use: No    Colonoscopy: yes, 05/2017, normal; Dr. Darrel Hoover   PAP: 03/2017  Bone density: yes, 2008 Mammography: 02/12/2019  Allergies  Allergen Reactions   Diflucan [Fluconazole] Anaphylaxis    Tongue swelling    Current Outpatient Medications  Medication Sig Dispense Refill   ACZONE 7.5 % GEL      anastrozole (ARIMIDEX) 1 MG tablet TAKE 1 TABLET BY MOUTH EVERY DAY 90 tablet 2   Ascorbic Acid (VITAMIN C) 1000 MG tablet Take 1,000 mg by mouth daily.     lactobacillus acidophilus (BACID) TABS tablet Take 1 tablet by mouth 3 (three) times a week. Reported on 12/12/2015     LORazepam (ATIVAN) 0.5 MG tablet Take 0.5 mg by  mouth at bedtime.     Magnesium 300 MG CAPS Take 1 capsule by mouth daily.     multivitamin-lutein (OCUVITE-LUTEIN) CAPS Take 1 capsule by mouth daily.     OVER THE COUNTER MEDICATION Vitamin D 3 2000 iu taking in the a.m.     OVER THE COUNTER MEDICATION Flaxseed oil 1000 mg taking daily     OVER THE COUNTER MEDICATION Epax fish oil Omega 3 taking daily     TRAVATAN Z 0.004 % SOLN ophthalmic solution Place 1 drop into both eyes at bedtime.     No current facility-administered medications for this visit.      OBJECTIVE: Middle-aged white woman who appears stated age  37:   11/18/19 1033  BP: (!) 152/77  Pulse: 90  Resp: 18  Temp: 98.3 F (36.8 C)  SpO2: 100%     Body mass index is 27.22 kg/m.   Wt Readings from Last 3 Encounters:  11/18/19 163 lb 9.6 oz (74.2 kg)  08/12/19 162 lb 4 oz (73.6 kg)  07/27/19 160 lb 6.4 oz (72.8 kg)      ECOG FS:1 - Symptomatic but completely ambulatory  Sclerae unicteric, EOMs intact Wearing a mask No cervical or supraclavicular adenopathy Lungs no rales or rhonchi Heart regular rate and rhythm Abd soft, nontender, positive bowel sounds MSK no focal spinal tenderness, no upper extremity lymphedema Neuro: nonfocal, well oriented, appropriate affect Breasts: The right breast is status post lumpectomy and radiation.  There is no residual erythema.  There are no palpable masses.  The cosmetic result is good.  The left breast is benign.  Both axillae are benign.   LAB RESULTS:  CMP     Component Value Date/Time   NA 141 11/18/2019 0955   K 4.3 11/18/2019 0955   CL 106 11/18/2019 0955   CO2 28 11/18/2019 0955   GLUCOSE 96 11/18/2019 0955   BUN 21 (H) 11/18/2019 0955   CREATININE 0.82 11/18/2019 0955   CREATININE 0.83 03/03/2019 0820   CREATININE 0.74 12/12/2015 1722   CALCIUM 9.4 11/18/2019 0955   PROT 7.2 11/18/2019 0955   ALBUMIN 4.2 11/18/2019 0955   AST 25 11/18/2019 0955   AST 16 03/03/2019 0820   ALT 23 11/18/2019  0955   ALT 16 03/03/2019 0820   ALKPHOS 80 11/18/2019 0955   BILITOT 0.9 11/18/2019 0955   BILITOT 0.8 03/03/2019 0820   GFRNONAA >60 11/18/2019 0955   GFRNONAA >60 03/03/2019 0820   GFRNONAA >89 12/12/2015 1722  GFRAA >60 11/18/2019 0955   GFRAA >60 03/03/2019 0820   GFRAA >89 12/12/2015 1722    No results found for: TOTALPROTELP, ALBUMINELP, A1GS, A2GS, BETS, BETA2SER, GAMS, MSPIKE, SPEI  No results found for: KPAFRELGTCHN, LAMBDASER, [facility]  Lab Results  Component Value Date   WBC 4.4 11/18/2019   NEUTROABS 2.9 11/18/2019   HGB 15.3 (H) 11/18/2019   HCT 45.2 11/18/2019   MCV 89.7 11/18/2019   PLT 203 11/18/2019   No results found for: LABCA2  No components found for: XENMMH680  No results for input(s): INR in the last 168 hours.  No results found for: LABCA2  No results found for: SUP103  No results found for: PRX458  No results found for: PFY924  No results found for: CA2729  No components found for: HGQUANT  No results found for: CEA1 / No results found for: CEA1   No results found for: AFPTUMOR  No results found for: CHROMOGRNA  No results found for: PSA1  Appointment on 11/18/2019  Component Date Value Ref Range Status   Sodium 11/18/2019 141  135 - 145 mmol/L Final   Potassium 11/18/2019 4.3  3.5 - 5.1 mmol/L Final   Chloride 11/18/2019 106  98 - 111 mmol/L Final   CO2 11/18/2019 28  22 - 32 mmol/L Final   Glucose, Bld 11/18/2019 96  70 - 99 mg/dL Final   BUN 11/18/2019 21* 6 - 20 mg/dL Final   Creatinine, Ser 11/18/2019 0.82  0.44 - 1.00 mg/dL Final   Calcium 11/18/2019 9.4  8.9 - 10.3 mg/dL Final   Total Protein 11/18/2019 7.2  6.5 - 8.1 g/dL Final   Albumin 11/18/2019 4.2  3.5 - 5.0 g/dL Final   AST 11/18/2019 25  15 - 41 U/L Final   ALT 11/18/2019 23  0 - 44 U/L Final   Alkaline Phosphatase 11/18/2019 80  38 - 126 U/L Final   Total Bilirubin 11/18/2019 0.9  0.3 - 1.2 mg/dL Final   GFR calc non Af Amer 11/18/2019 >60   >60 mL/min Final   GFR calc Af Amer 11/18/2019 >60  >60 mL/min Final   Anion gap 11/18/2019 7  5 - 15 Final   Performed at [facility], [place] [address] [place], [place] [address]   WBC 11/18/2019 4.4  4.0 - 10.5 K/uL Final   RBC 11/18/2019 5.04  3.87 - 5.11 MIL/uL Final   Hemoglobin 11/18/2019 15.3* 12.0 - 15.0 g/dL Final   HCT 11/18/2019 45.2  36.0 - 46.0 % Final   MCV 11/18/2019 89.7  80.0 - 100.0 fL Final   MCH 11/18/2019 30.4  26.0 - 34.0 pg Final   MCHC 11/18/2019 33.8  30.0 - 36.0 g/dL Final   RDW 11/18/2019 12.1  11.5 - 15.5 % Final   Platelets 11/18/2019 203  150 - 400 K/uL Final   nRBC 11/18/2019 0.0  0.0 - 0.2 % Final   Neutrophils Relative % 11/18/2019 65  % Final   Neutro Abs 11/18/2019 2.9  1.7 - 7.7 K/uL Final   Lymphocytes Relative 11/18/2019 22  % Final   Lymphs Abs 11/18/2019 1.0  0.7 - 4.0 K/uL Final   Monocytes Relative 11/18/2019 9  % Final   Monocytes Absolute 11/18/2019 0.4  0.1 - 1.0 K/uL Final   Eosinophils Relative 11/18/2019 2  % Final   Eosinophils Absolute 11/18/2019 0.1  0.0 - 0.5 K/uL Final   Basophils Relative 11/18/2019 1  % Final   Basophils Absolute 11/18/2019 0.0  0.0 - 0.1 K/uL Final   Immature Granulocytes 11/18/2019 1  % Final   Abs Immature Granulocytes 11/18/2019 0.02  0.00 - 0.07 K/uL Final   Performed at [facility], [place] [address] [place], [place] [address]    (this displays the last labs from the last 3 days)  No results found for: TOTALPROTELP, ALBUMINELP, A1GS, A2GS, BETS, BETA2SER, GAMS, MSPIKE, SPEI (this displays SPEP labs)  No results found for: KPAFRELGTCHN, LAMBDASER, KAPLAMBRATIO (kappa/lambda light chains)  No results found for: HGBA, HGBA2QUANT, HGBFQUANT, HGBSQUAN (Hemoglobinopathy evaluation)   No results found for: LDH  No results found for: IRON, TIBC, IRONPCTSAT (Iron and TIBC)  No results found for: FERRITIN  Urinalysis    Component  Value Date/Time   COLORURINE YELLOW 05/29/2009 1120   APPEARANCEUR CLEAR 05/29/2009 1120   LABSPEC 1.015 05/29/2009 1120   PHURINE 7.5 05/29/2009 1120   GLUCOSEU NEGATIVE 05/29/2009 1120   HGBUR NEGATIVE 05/29/2009 1120   BILIRUBINUR negative 12/12/2015 1735   BILIRUBINUR n 02/13/2015 1150   KETONESUR negative 12/12/2015 1735   KETONESUR NEGATIVE 05/29/2009 1120   PROTEINUR negative 12/12/2015 1735   PROTEINUR n 02/13/2015 1150   PROTEINUR NEGATIVE 05/29/2009 1120   UROBILINOGEN 0.2 12/12/2015 1735   UROBILINOGEN 0.2 05/29/2009 1120   NITRITE Negative 12/12/2015 1735   NITRITE n 02/13/2015 1150   NITRITE NEGATIVE 05/29/2009 1120   LEUKOCYTESUR Negative 12/12/2015 1735     STUDIES:  No results found.   ELIGIBLE FOR AVAILABLE RESEARCH PROTOCOL: no   ASSESSMENT: 58 y.o. [place], [place] woman status post right breast upper inner quadrant biopsy 02/22/2019 for a clinical T1b N0, stage IA base of ductal carcinoma, rate 1, estrogen and progesterone receptor positive, HER-2 negative by FISH, with an MIB-1 of 1%  (1) status post right lumpectomy and sentinel lymph node sampling 03/22/2019 for a pT1c pN0, stage IA invasive ductal carcinoma, with negative margins.  (a) a total of 3 sentinel lymph nodes were removed  (2) Oncotype DX score of 14 predicts a risk of recurrence outside the breast in the next 9 years of 4% if the patient's only systemic treatment is antiestrogens for 5 years.  It also predicts no benefit from chemotherapy  (3) adjuvant radiation 05/24/2019 - 07/08/2019  (a) right breast / 50.4 Gy in 28 fractions  (b) boost / 10 Gy in 5 fractions  (4) anastrozole started 04/02/2019  (a) bone density 10/17/2003 showed a T score of -2.1  (5) genetics testing 07/19/2019 through the Common Hereditary Gene Panel offered by [facility] found no deleterious mutations in APC, ATM, AXIN2, BARD1, BMPR1A, BRCA1, BRCA2, BRIP1, CDH1, CDK4, CDKN2A (p14ARF), CDKN2A (p16INK4a), CHEK2, CTNNA1,  DICER1, EPCAM (Deletion/duplication testing only), GREM1 (promoter region deletion/duplication testing only), KIT, MEN1, MLH1, MSH2, MSH3, MSH6, MUTYH, NBN, NF1, NHTL1, PALB2, PDGFRA, PMS2, POLD1, POLE, PTEN, RAD50, RAD51C, RAD51D, RNF43, SDHB, SDHC, SDHD, SMAD4, SMARCA4. STK11, TP53, TSC1, TSC2, and VHL.  The following genes were evaluated for sequence changes only: SDHA and HOXB13 c.251G>A variant only.   PLAN: Mailee is tolerating anastrozole well and the plan will be to continue that a minimum of 5 years.  She is already thinking that perhaps she would like to go to 7.  She is concerned about radiation.  She had some CT scans in the past.  I think the amount of radiation from a bone density scan is minimal but in any case what she would like to do is try to find out records from her prior bone  densities which were obtained through Dr. Sander Radon and of course Dr. Theda Sers this been gone for a while.  Lucresia is going to try to work on that.  She and her husband are going to have Christmas by themselves.  They do not want her son from [place] to be coming.  Of course they do have a son here in town who lives with them.  He is currently looking for a job and is very careful as far as the pandemic exposures are concerned  She will see me again in April after her mammogram in March.  She knows to call for any other issue that may develop before the next visit.   Christina Waldrop, Virgie Dad, MD  11/18/19 11:09 AM Medical Oncology and Hematology [facility] [place], [place] [address] Tel. [phone]    Fax. [phone]    I, Wilburn Mylar, am acting as scribe for Dr. Virgie Dad. Nickalous Stingley.  I, Lurline Del MD, have reviewed the above documentation for accuracy and completeness, and I agree with the above.

## 2019-11-18 ENCOUNTER — Inpatient Hospital Stay: Payer: BC Managed Care – PPO | Attending: Oncology | Admitting: Oncology

## 2019-11-18 ENCOUNTER — Other Ambulatory Visit: Payer: Self-pay

## 2019-11-18 ENCOUNTER — Inpatient Hospital Stay: Payer: BC Managed Care – PPO

## 2019-11-18 VITALS — BP 152/77 | HR 90 | Temp 98.3°F | Resp 18 | Ht 65.0 in | Wt 163.6 lb

## 2019-11-18 DIAGNOSIS — R232 Flushing: Secondary | ICD-10-CM | POA: Diagnosis not present

## 2019-11-18 DIAGNOSIS — F419 Anxiety disorder, unspecified: Secondary | ICD-10-CM | POA: Insufficient documentation

## 2019-11-18 DIAGNOSIS — C50211 Malignant neoplasm of upper-inner quadrant of right female breast: Secondary | ICD-10-CM | POA: Diagnosis not present

## 2019-11-18 DIAGNOSIS — Z170 Estrogen receptor positive status [ER+]: Secondary | ICD-10-CM

## 2019-11-18 DIAGNOSIS — I739 Peripheral vascular disease, unspecified: Secondary | ICD-10-CM | POA: Diagnosis not present

## 2019-11-18 DIAGNOSIS — C50911 Malignant neoplasm of unspecified site of right female breast: Secondary | ICD-10-CM | POA: Insufficient documentation

## 2019-11-18 DIAGNOSIS — Z803 Family history of malignant neoplasm of breast: Secondary | ICD-10-CM | POA: Diagnosis not present

## 2019-11-18 DIAGNOSIS — Z79899 Other long term (current) drug therapy: Secondary | ICD-10-CM | POA: Insufficient documentation

## 2019-11-18 DIAGNOSIS — Z79811 Long term (current) use of aromatase inhibitors: Secondary | ICD-10-CM | POA: Diagnosis not present

## 2019-11-18 DIAGNOSIS — Z801 Family history of malignant neoplasm of trachea, bronchus and lung: Secondary | ICD-10-CM | POA: Insufficient documentation

## 2019-11-18 DIAGNOSIS — Z8042 Family history of malignant neoplasm of prostate: Secondary | ICD-10-CM | POA: Insufficient documentation

## 2019-11-18 DIAGNOSIS — M8580 Other specified disorders of bone density and structure, unspecified site: Secondary | ICD-10-CM | POA: Diagnosis not present

## 2019-11-18 LAB — COMPREHENSIVE METABOLIC PANEL
ALT: 23 U/L (ref 0–44)
AST: 25 U/L (ref 15–41)
Albumin: 4.2 g/dL (ref 3.5–5.0)
Alkaline Phosphatase: 80 U/L (ref 38–126)
Anion gap: 7 (ref 5–15)
BUN: 21 mg/dL — ABNORMAL HIGH (ref 6–20)
CO2: 28 mmol/L (ref 22–32)
Calcium: 9.4 mg/dL (ref 8.9–10.3)
Chloride: 106 mmol/L (ref 98–111)
Creatinine, Ser: 0.82 mg/dL (ref 0.44–1.00)
GFR calc Af Amer: >60 mL/min (ref >60)
GFR calc non Af Amer: >60 mL/min (ref >60)
Glucose, Bld: 96 mg/dL (ref 70–99)
Potassium: 4.3 mmol/L (ref 3.5–5.1)
Sodium: 141 mmol/L (ref 135–145)
Total Bilirubin: 0.9 mg/dL (ref 0.3–1.2)
Total Protein: 7.2 g/dL (ref 6.5–8.1)

## 2019-11-18 LAB — CBC WITH DIFFERENTIAL/PLATELET
Abs Immature Granulocytes: 0.02 10*3/uL (ref 0.00–0.07)
Basophils Absolute: 0 10*3/uL (ref 0.0–0.1)
Basophils Relative: 1 %
Eosinophils Absolute: 0.1 10*3/uL (ref 0.0–0.5)
Eosinophils Relative: 2 %
HCT: 45.2 % (ref 36.0–46.0)
Hemoglobin: 15.3 g/dL — ABNORMAL HIGH (ref 12.0–15.0)
Immature Granulocytes: 1 %
Lymphocytes Relative: 22 %
Lymphs Abs: 1 10*3/uL (ref 0.7–4.0)
MCH: 30.4 pg (ref 26.0–34.0)
MCHC: 33.8 g/dL (ref 30.0–36.0)
MCV: 89.7 fL (ref 80.0–100.0)
Monocytes Absolute: 0.4 10*3/uL (ref 0.1–1.0)
Monocytes Relative: 9 %
Neutro Abs: 2.9 10*3/uL (ref 1.7–7.7)
Neutrophils Relative %: 65 %
Platelets: 203 10*3/uL (ref 150–400)
RBC: 5.04 MIL/uL (ref 3.87–5.11)
RDW: 12.1 % (ref 11.5–15.5)
WBC: 4.4 10*3/uL (ref 4.0–10.5)
nRBC: 0 % (ref 0.0–0.2)

## 2019-11-19 ENCOUNTER — Telehealth: Payer: Self-pay | Admitting: Oncology

## 2019-11-19 NOTE — Telephone Encounter (Signed)
I talk with patient regarding schedule  

## 2019-12-16 ENCOUNTER — Encounter: Payer: Self-pay | Admitting: *Deleted

## 2020-01-03 ENCOUNTER — Telehealth: Payer: Self-pay | Admitting: *Deleted

## 2020-01-03 NOTE — Telephone Encounter (Addendum)
[place] Work  Patient returned [employer]- Mrs. Rhan shared many layers of grief and external stressors she's experienced over the past 10 years.  Patient identified cancer diagnosis, family deaths, death of a cherished pet, and difficult family dynamics as sources of grief and anxiety.  Patient is seeking individual counseling and would prefer to meet in person.  CSW will discuss with CSW team- patient interested in meeting with Edwyna Shell for limited session and then plan for referral to community therapy.  CSW reviewed [facility] program and patient agreed to referral.  CSW will make referral for upcoming February program.   [name], [place]

## 2020-01-03 NOTE — Telephone Encounter (Signed)
Rhonda [name]  Clinical Social [name] received voicemail from patient.  CSW attempted to call patient back but unable to reach, left voicemail to return call.  [name], LCSW  Clinical Social Worker [facility]

## 2020-01-07 ENCOUNTER — Telehealth: Payer: Self-pay | Admitting: General Practice

## 2020-01-07 NOTE — Telephone Encounter (Signed)
[facility] CSW Progress Notes  CSW received request from [place] to contact patient to discuss options for short term supportive counseling and referral for more long term support if needed.  Unable to reach, left VM and requested return call at patient's convenience.  Edwyna Shell, LCSW Clinical Social Worker Phone:  [phone]

## 2020-01-21 ENCOUNTER — Encounter: Payer: Self-pay | Admitting: General Practice

## 2020-01-21 NOTE — Progress Notes (Signed)
[facility] CSW Progress Notes  CSW returned patient's call.  She is seeking support for ongoing issues w unresolved grief due to unexpected deaths of multiple family members, significant anxiety, family issues and ongoing health concerns as result of diagnosis and treatment for cancer.  She was referred to therapist at [facility] and medication management support at [facility] outpatient approx 6 months ago.  Did not connect with those providers, would like to be seen in person and prefers a faith based approach.  In addition, she anticipates a change in insurance due to the end of the COBRA period - she is unsure where she will find new coverage and the terms of the plan.  In light of the issues she needs support with and the uncertainty about coverage details, CSW will send information to patient on [facility] for grief counseling, [facility] for trauma counseling and [facility] for faith based therapy services.  Patient can explore these options and determine whether they are a good fit for her, referrals are not needed as patient can self refer.  Patient has decided not to participate in [facility] at this time and prefers to find individual supportive counseling.  Referral information sent to patient by email.  Edwyna Shell, LCSW Clinical Social Worker Phone:  [phone]

## 2020-02-04 ENCOUNTER — Encounter: Payer: Self-pay | Admitting: *Deleted

## 2020-02-14 ENCOUNTER — Ambulatory Visit
Admission: RE | Admit: 2020-02-14 | Discharge: 2020-02-14 | Disposition: A | Discharge status: Home / Self Care | Payer: 59 | Source: Ambulatory Visit | Attending: Oncology | Admitting: Oncology

## 2020-02-14 ENCOUNTER — Other Ambulatory Visit: Payer: Self-pay

## 2020-02-14 ENCOUNTER — Other Ambulatory Visit: Payer: Self-pay | Admitting: Oncology

## 2020-02-14 DIAGNOSIS — M8580 Other specified disorders of bone density and structure, unspecified site: Secondary | ICD-10-CM

## 2020-02-14 DIAGNOSIS — C50211 Malignant neoplasm of upper-inner quadrant of right female breast: Secondary | ICD-10-CM

## 2020-02-14 DIAGNOSIS — Z170 Estrogen receptor positive status [ER+]: Secondary | ICD-10-CM

## 2020-02-14 HISTORY — DX: Personal history of irradiation: Z92.3

## 2020-02-18 ENCOUNTER — Ambulatory Visit: Payer: 59 | Attending: Internal Medicine

## 2020-02-18 DIAGNOSIS — Z23 Encounter for immunization: Secondary | ICD-10-CM

## 2020-02-18 NOTE — Progress Notes (Addendum)
   Covid-19 Vaccination Clinic  Name:  Rhonda [name]    MRN: [id number] DOB: [birth year]  02/18/2020  [name] was observed post Covid-19 immunization for 30 minutes without incident. She was provided with Vaccine Information Sheet and instruction to access the V-Safe system.   [name] was instructed to call 911 with any severe reactions post vaccine: Marland Kitchen Difficulty breathing  . Swelling of face and throat  . A fast heartbeat  . A bad rash all over body  . Dizziness and weakness   Immunizations Administered    Name Date Dose VIS Date Route   Pfizer COVID-19 Vaccine 02/18/2020  3:23 PM 0.3 mL 11/26/2019 Intramuscular   Manufacturer: [place]   Lot: [id number]   [place]: [id number]

## 2020-03-15 ENCOUNTER — Ambulatory Visit: Payer: Self-pay

## 2020-03-15 ENCOUNTER — Ambulatory Visit: Payer: 59 | Attending: Internal Medicine

## 2020-03-15 DIAGNOSIS — Z23 Encounter for immunization: Secondary | ICD-10-CM

## 2020-03-15 IMAGING — MG MM PLC BREAST LOC DEV 1ST LESION INC*R*
8 series · 8 of 8 positions shown · non-contrast
Comparison: Previous exam(s).

CLINICAL DATA: Patient with a RIGHT breast cancer scheduled for
breast conservation surgery requiring preoperative radioactive seed
localization.

EXAM:
MAMMOGRAPHIC GUIDED RADIOACTIVE SEED LOCALIZATION OF THE RIGHT
BREAST

[R ML (1 of 5)]
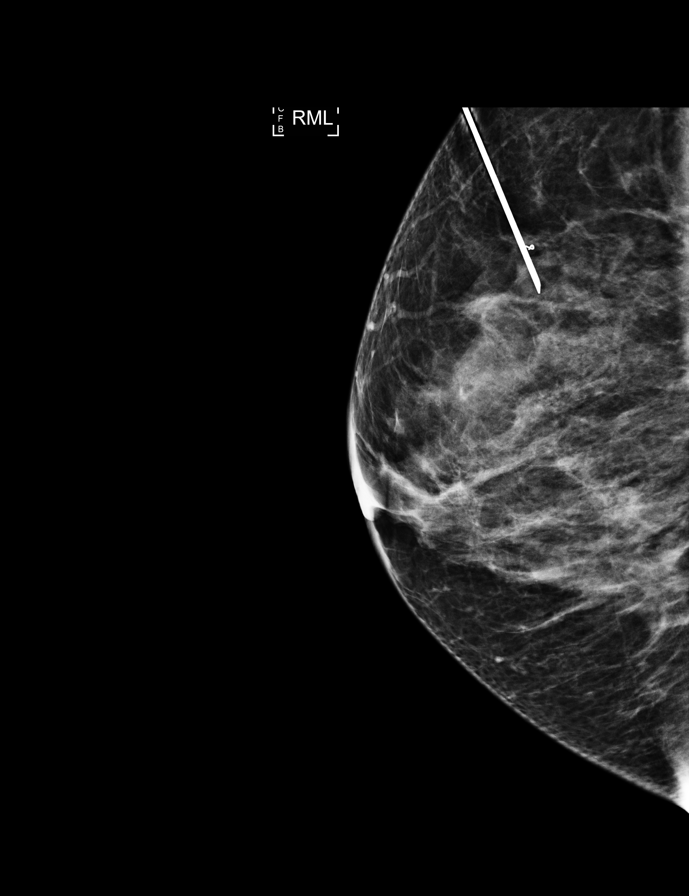

[R ML (2 of 5)]
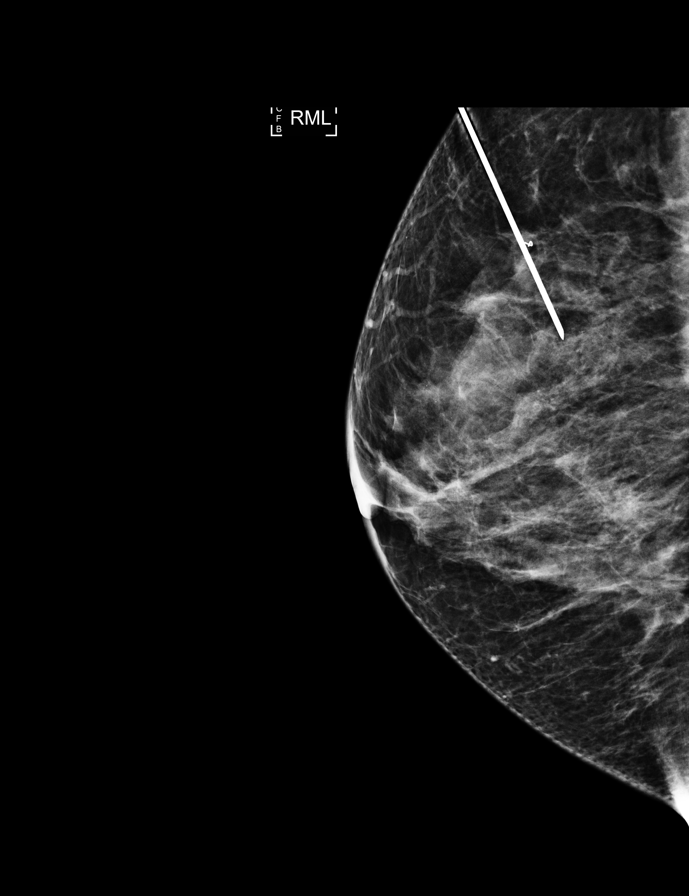

[R ML (3 of 5)]
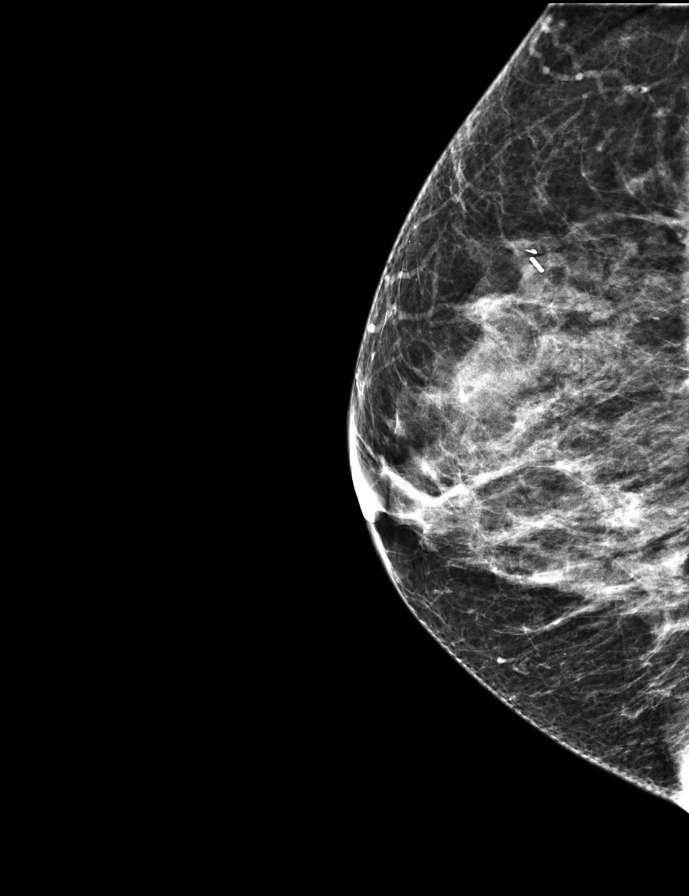

[R ML (4 of 5)]
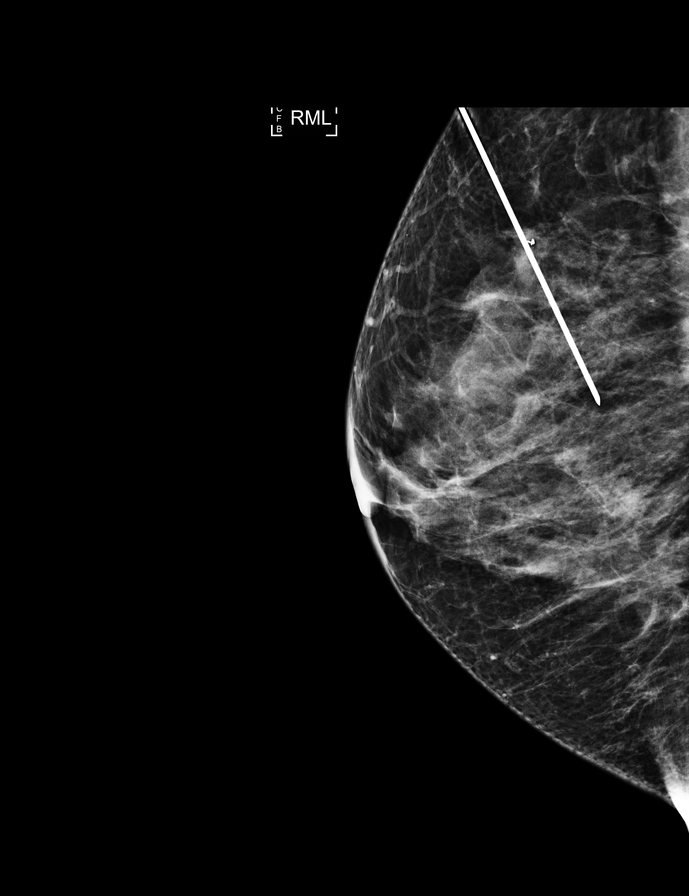

[R CC (1 of 3)]
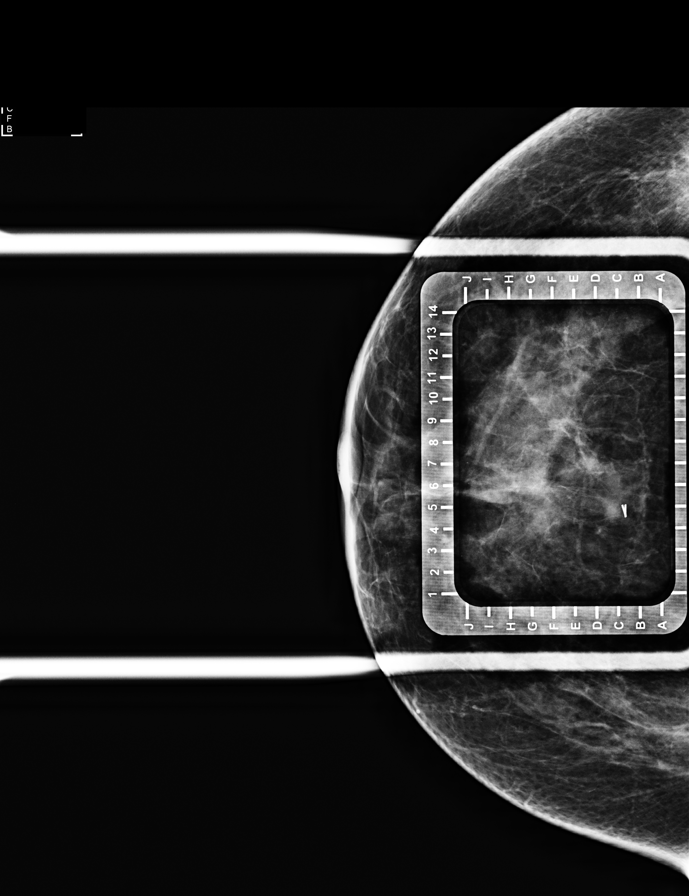

[R CC (2 of 3)]
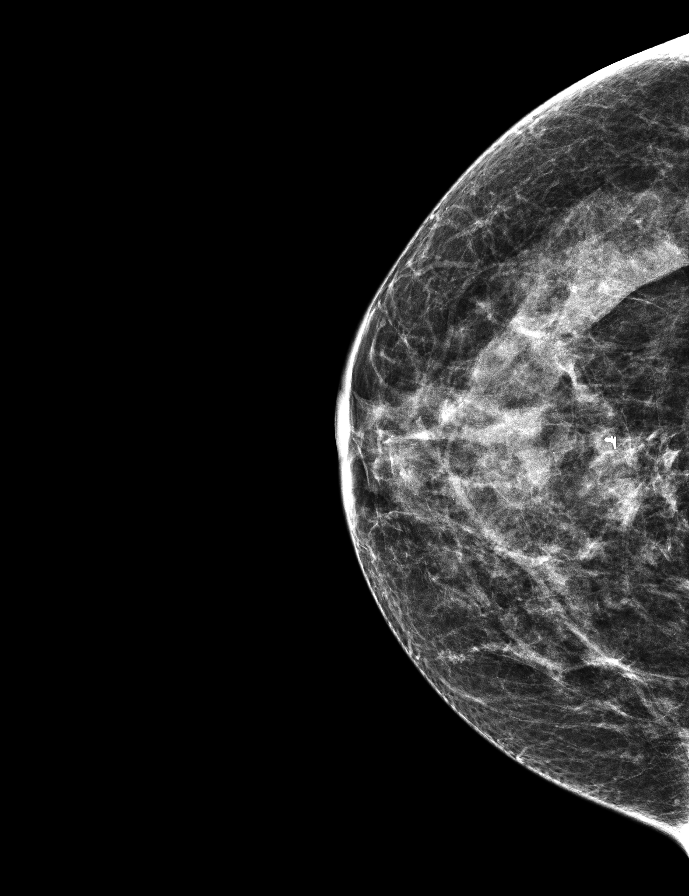

[R ML (5 of 5)]
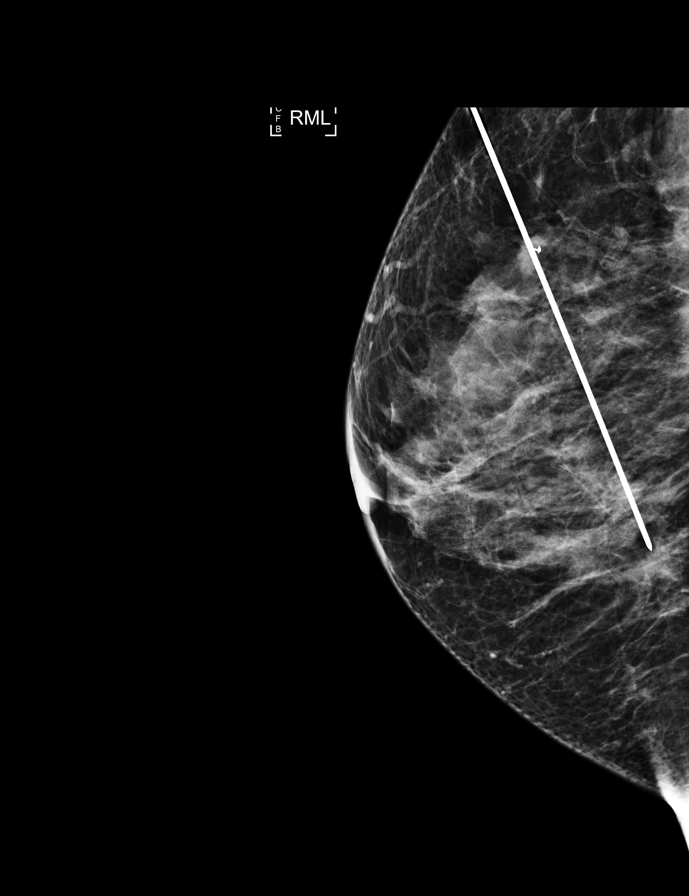

[R CC (3 of 3)]
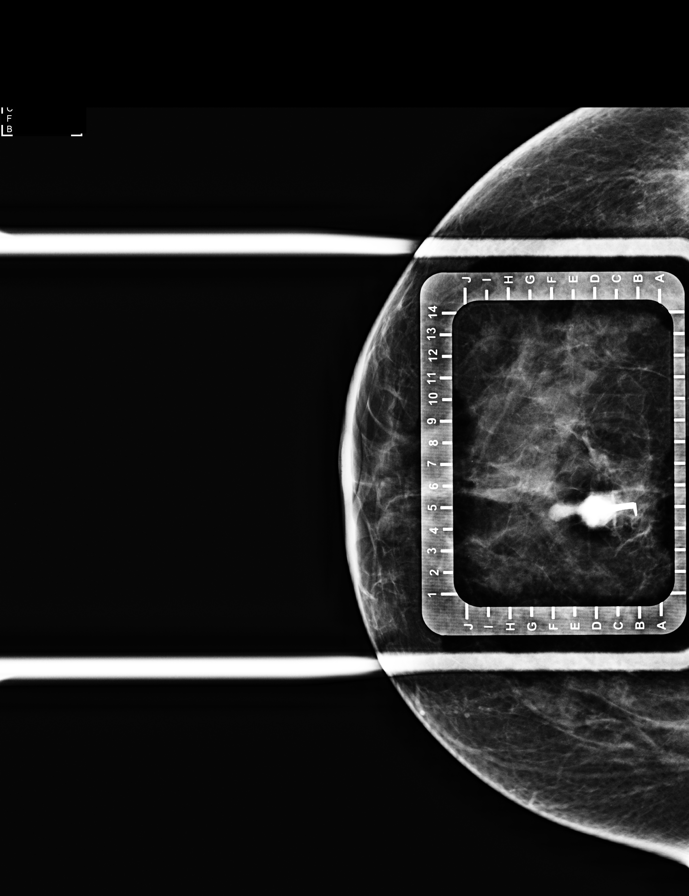

[8 of 8 positions shown; findings below may reference images not displayed]

FINDINGS: Patient presents for radioactive seed localization prior to breast
conservation surgery. I met with the patient and we discussed the
procedure of seed localization including benefits and alternatives.
We discussed the high likelihood of a successful procedure. We
discussed the risks of the procedure including infection, bleeding,
tissue injury and further surgery. We discussed the low dose of
radioactivity involved in the procedure. Informed, written consent
was given.

The usual time-out protocol was performed immediately prior to the
procedure.

Using mammographic guidance, sterile technique, 1% lidocaine and an
Y-RTK radioactive seed, the ribbon shaped clip within the upper
RIGHT breast was localized using a superior approach. The follow-up
mammogram images confirm the seed in the expected location and were
marked for Dr. Pinto.

Follow-up survey of the patient confirms presence of the radioactive
seed.

Order number of Y-RTK seed:  [id number].

Total activity:  0.251 millicurie reference Date: 03/04/2019

The patient tolerated the procedure well and was released from the
[REDACTED]. She was given instructions regarding seed removal.
IMPRESSION: Radioactive seed localization right breast. No apparent
complications.

## 2020-03-15 NOTE — Progress Notes (Signed)
   Covid-19 Vaccination Clinic  Name:  Rhonda [name]    MRN: [id number] DOB: [birth year]  03/15/2020  [name] was observed post Covid-19 immunization for 15 minutes without incident. She was provided with Vaccine Information Sheet and instruction to access the V-Safe system.   [name] was instructed to call 911 with any severe reactions post vaccine: Marland Kitchen Difficulty breathing  . Swelling of face and throat  . A fast heartbeat  . A bad rash all over body  . Dizziness and weakness   Immunizations Administered    Name Date Dose VIS Date Route   Pfizer COVID-19 Vaccine 03/15/2020 10:56 AM 0.3 mL 11/26/2019 Intramuscular   Manufacturer: [place]   Lot: [id number]   [place]: [id number]

## 2020-03-18 IMAGING — DX MM BREAST SURGICAL SPECIMEN
1 series · 2 of 2 positions shown · non-contrast
Comparison: Previous exam(s).

CLINICAL DATA: RIGHT breast cancer status post lumpectomy today
after earlier radioactive seed localization.

EXAM:
SPECIMEN RADIOGRAPH OF THE RIGHT BREAST

[Series 2: specimen digital x-ray, derived · right · 2 of 2 slices shown]
[im 1/2]
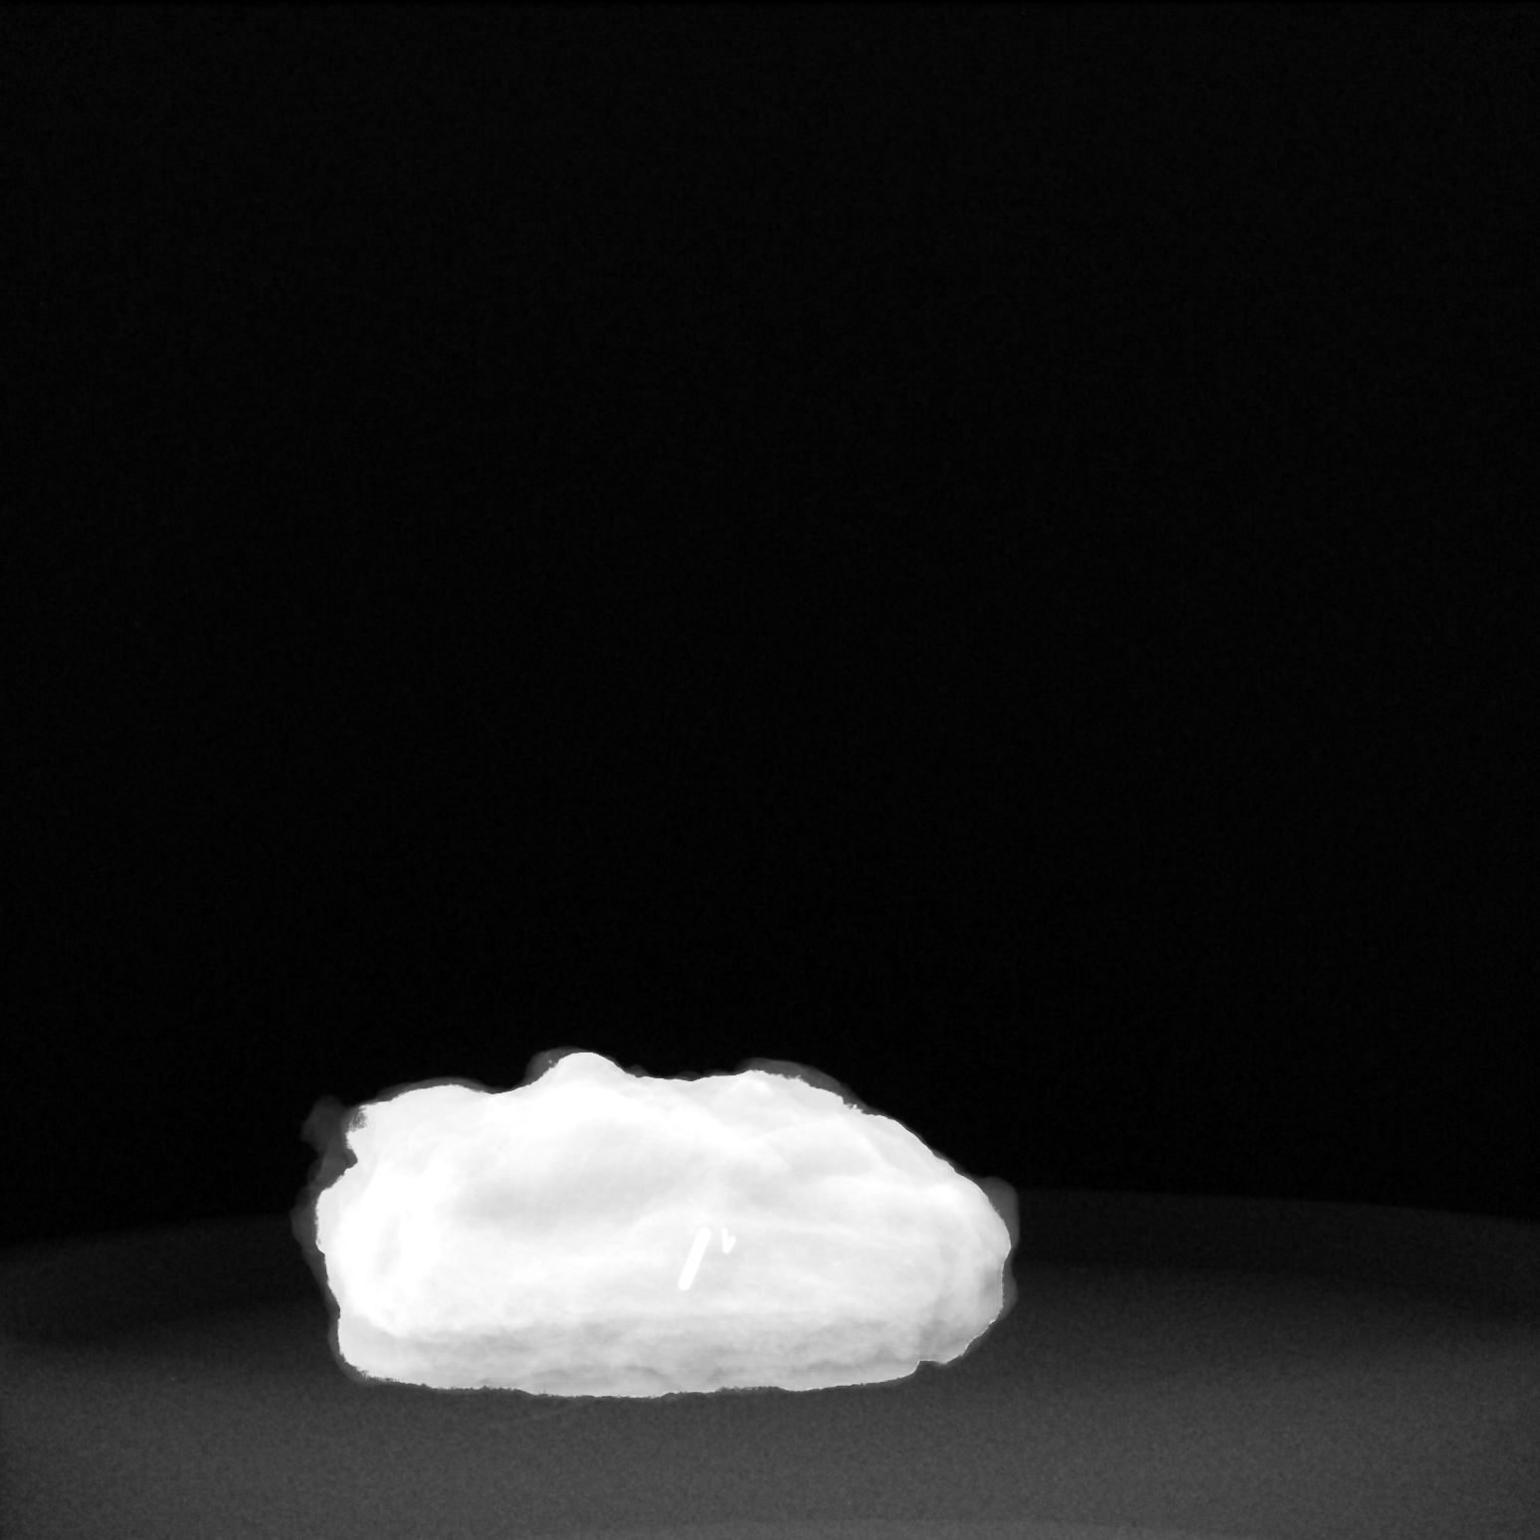
[im 2/2]
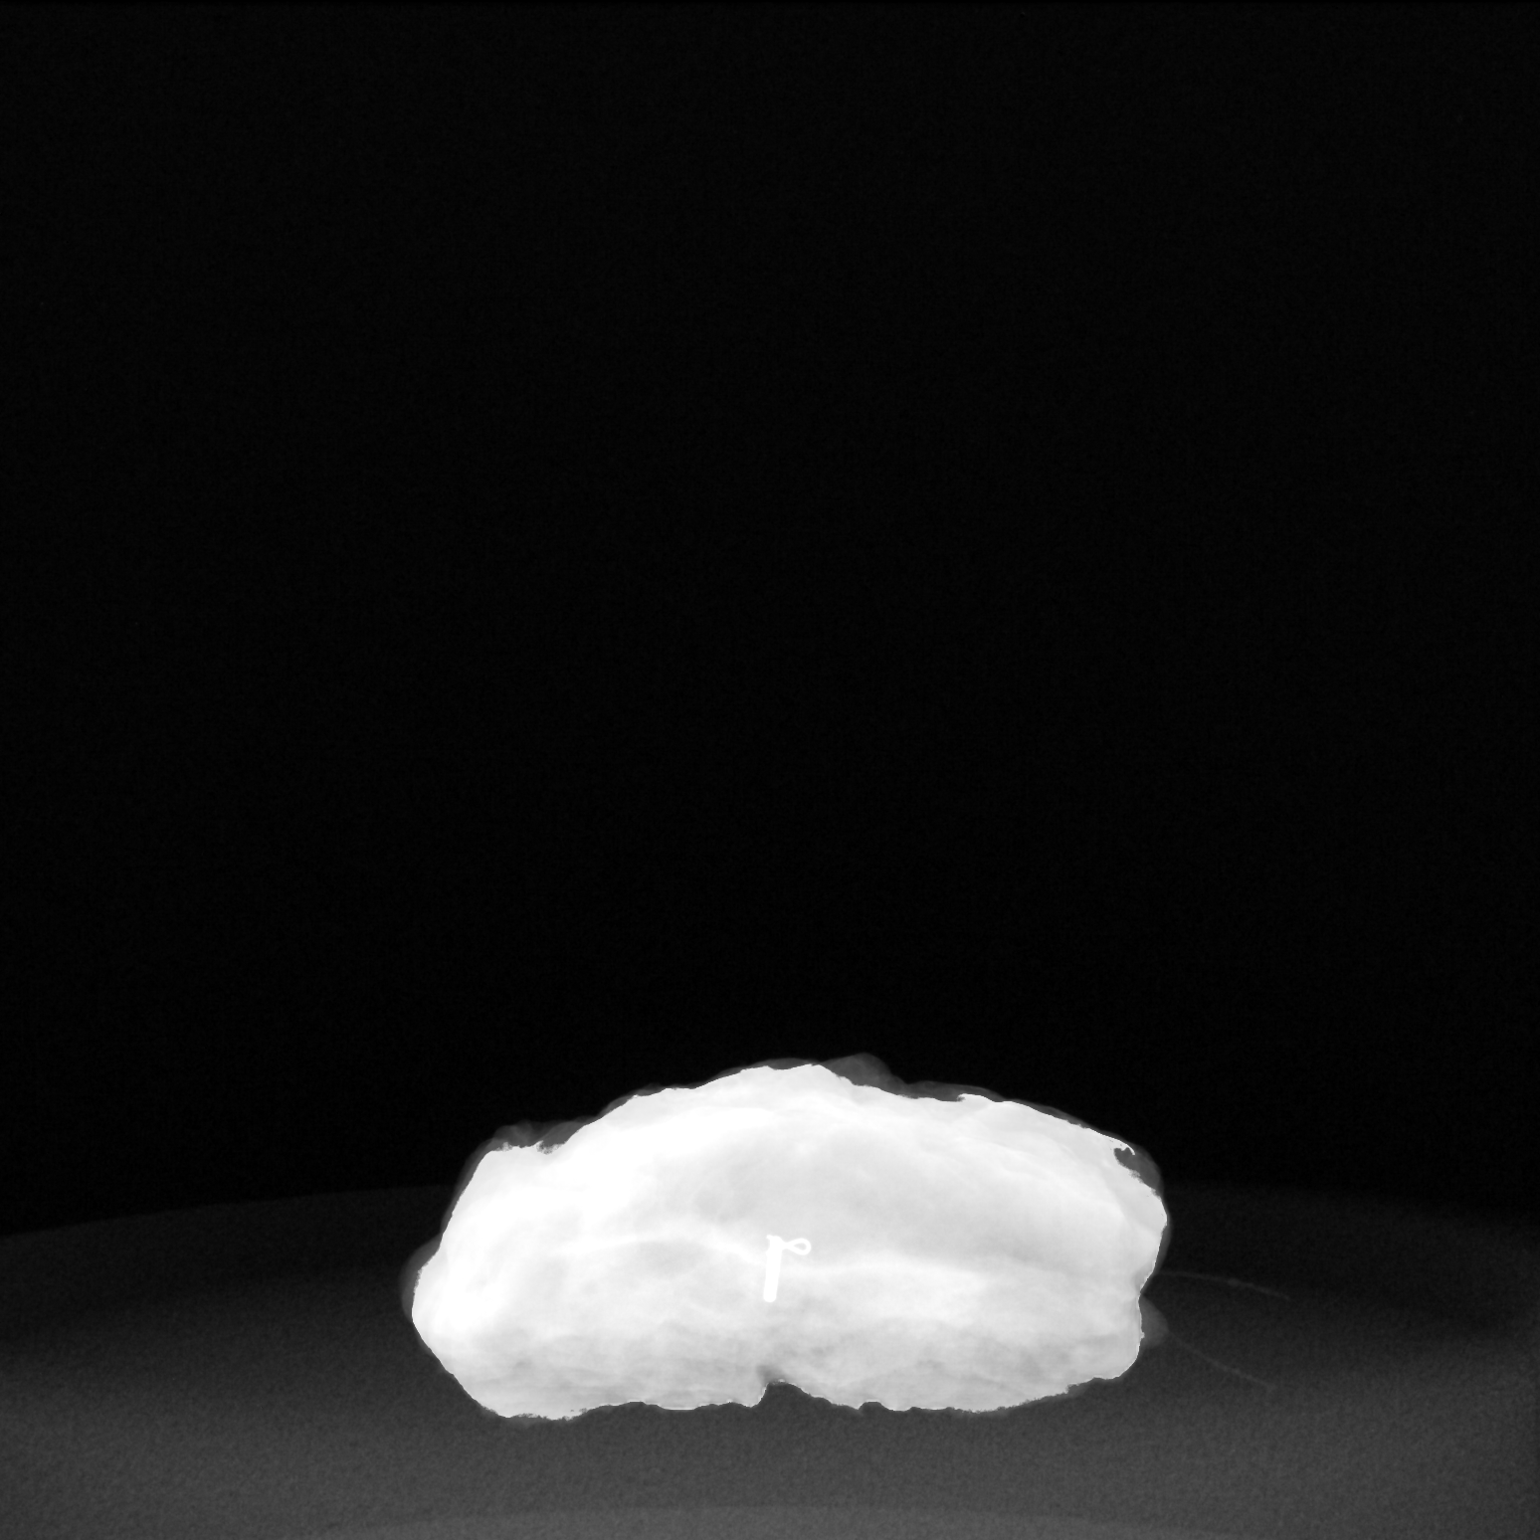

[2 of 2 positions shown; findings below may reference images not displayed]

FINDINGS: Status post excision of the right breast. The radioactive seed and
biopsy marker clip are present, completely intact, and were marked
for pathology. Findings discussed with the OR staff during the
procedure.
IMPRESSION: Specimen radiograph of the right breast.

## 2020-03-19 NOTE — Progress Notes (Signed)
[facility]  Telephone:([phone] Fax:([phone]    ID: Rhonda [name] DOB: Oct 23, 1961  MR#: [id number]  QMV#:[id number]  Patient Care Team: Lavone Orn, MD as PCP - General (Internal Medicine) Mauro Kaufmann, RN as Oncology Nurse Navigator Rockwell Germany, RN as Oncology Nurse Navigator Alphonsa Overall, MD as Consulting Physician (General Surgery) Delories Mauri, Virgie Dad, MD as Consulting Physician (Oncology) Gery Pray, MD as Consulting Physician (Radiation Oncology) Nunzio Cobbs, MD as Consulting Physician (Obstetrics and Gynecology) Macario Carls, MD as Referring Physician (Dermatology) Katy Apo, MD as Consulting Physician (Ophthalmology) Linus Mako, MD as Consulting Physician (Vascular Surgery) OTHER MD:   CHIEF COMPLAINT: Estrogen receptor positive breast cancer  CURRENT TREATMENT: anastrozole   INTERVAL HISTORY: Rhonda [name] returns today for follow-up of her estrogen receptor positive breast cancer.  She continues on anastrozole.  She is tolerating this well, with no significant side effects that she is aware of.  Since her last visit, she underwent bilateral diagnostic mammography with tomography and left breast ultrasonography at [facility] on 02/14/2020 showing: breast density category C; two benign left breast cysts; no evidence of malignancy in either breast.   She has received both of her Covid-19 vaccines-- on 02/18/2020 and 03/15/2020.   REVIEW OF SYSTEMS: Rhonda [name] has a history of anxiety.  She finds this difficult to manage and it causes her insomnia.  She is concerned about her blood pressure and of course the concern itself tends to raise her blood pressure.  She saw Dr. Laurann Montana and he has made it possible for her to check her blood pressure at home which I think will clarify that issue.  She was concerned that Arimidex might possibly be a cause but if so that would be an unlikely 1.  She brought me copies of her earlier  bone densities and they do confirm osteopenia.  A detailed review of systems today was otherwise noncontributory   HISTORY OF CURRENT ILLNESS: From the original intake note:  Rhonda [name] had routine screening mammography on 02/12/2019 showing a possible abnormality in the right breast. She underwent unilateral right diagnostic mammography with tomography and right breast ultrasonography at [facility] on 02/17/2019 showing: Breast Density Category C. a persistent irregular mass measuring approximately 0.8 cm. Ultrasound of the right breast at 12 o'clock, 5 cm from the nipple demonstrates an irregular hypoechoic mass with indistinct margins measuring 0.7 x 0.5 x 0.7 cm. No blood flow seen within the mass on color Doppler imaging. Ultrasound of the right axilla demonstrates multiple normal-appearing nodes.  Accordingly on 02/22/2019 she proceeded to biopsy of the right breast area in question. 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 Prognostic indicators significant for: estrogen receptor, 100% positive and progesterone receptor, 100% positive, both with strong staining intensity. Proliferation marker Ki67 at 1%. HER2 equivocal (2+) by immunohistochemistry but negative by fluorescent in situ hybridization with a signals ratio 1.32 and number per cell 1.85.   The patient's subsequent history is as detailed below.   PAST MEDICAL HISTORY: Past Medical History:  Diagnosis Date  . Allergy    seasonal  . Anxiety   . Cancer ([place]) 02/2019   right breast IDC  . Family history of breast cancer   . Family history of prostate cancer   . Fibroid   . Glaucoma   . Leg pain, bilateral   . Lung nodules    benign  . Other and unspecified  hyperlipidemia 04/05/2013  . Peripheral vascular disease (HCC)    chronic venous insufficiency bilaterally  . Personal history of radiation therapy   . PONV (postoperative nausea and  vomiting)   . Varicose veins of both lower extremities     PAST SURGICAL HISTORY: Past Surgical History:  Procedure Laterality Date  . BLADDER SUSPENSION  2010   Dr.Silva  . BREAST CYST ASPIRATION Right 09/08/2012  . BREAST LUMPECTOMY Right 03/19/2019  . BREAST LUMPECTOMY WITH RADIOACTIVE SEED AND SENTINEL LYMPH NODE BIOPSY Right 03/22/2019   Procedure: RIGHT BREAST LUMPECTOMY WITH RADIOACTIVE SEED AND RIGHT AXILLARY SENTINEL LYMPH NODE BIOPSY;  Surgeon: Alphonsa Overall, MD;  Location: [facility];  Service: General;  Laterality: Right;  . INSERTION OF MESH N/A 11/01/2016   Procedure: INSERTION OF MESH;  Surgeon: Jackolyn Confer, MD;  Location: [facility];  Service: General;  Laterality: N/A;  . RHINOPLASTY    . TONSILLECTOMY    . varicose veins    . [place]  . VENTRAL HERNIA REPAIR N/A 11/01/2016   Procedure: VENTRAL HERNIA REPAIR WITH MESH;  Surgeon: Jackolyn Confer, MD;  Location: [facility];  Service: General;  Laterality: N/A;    FAMILY HISTORY: Family History  Problem Relation Age of Onset  . Cancer Maternal Grandmother        breast cancer  . Breast cancer Maternal Grandmother        unsure of age  . Heart disease Maternal Grandfather   . Stroke Brother   . Alcohol abuse Brother   . Liver disease Brother   . Varicose Veins Brother   . Heart disease Brother   . Cancer Mother   . Tracheal cancer Mother   . Varicose Veins Father   . Heart disease Father   . Prostate cancer Father   . Heart attack Brother   . Mental illness Brother        d. 50  . Varicose Veins Paternal Grandmother   . Prostate cancer Brother        possible, unsure if BPA vs prostate cancer   Rhonda [name]'s father died from congestive heart failure at age 92. Patients' mother died from tracheal carcinoma at age 14. The patient has 4 brothers and 1 sister. Patient denies anyone in her family having ovarian, or pancreatic cancer. Rhonda [name]'s mother was diagnosed with tracheal  carcinoma and acoustic neuroma at age 48. Rhonda [name]'s father was diagnosed with prostate cancer in his 64's. Rhonda [name]'s brother was diagnosed with prostate cancer in his mid 2's. Rhonda [name]'s maternal grandmother was diagnosed with breast cancer in her late 51's.    GYNECOLOGIC HISTORY:  Patient's last menstrual period was 04/05/2016 (approximate). 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 Her husband, Richardson Landry, is a retired Community education officer for [employer]. They have two children, Copywriter, advertising and [employer]. Remo Lipps is 20, lives in [place], [place], and is a music professor. Rhonda [name] is 65, lives in [place], [place], and recently graduated with for business. [employer].   ADVANCED DIRECTIVES: In the absence of any documentation, Rhonda [name]'s spouse, Richardson Landry, is her healthcare power of attorney.      HEALTH MAINTENANCE: Social History   Tobacco Use  . Smoking status: Never Smoker  . Smokeless tobacco: Never Used  Substance Use Topics  . Alcohol use: Yes    Alcohol/week: 1.0 standard drinks    Types: 1 Glasses of wine per week  . Drug use: No    Colonoscopy: yes, 05/2017, normal; Dr. Darrel Hoover   PAP: 03/2017  Bone density: August 11, 2008, showed a T score of -2.1 Mammography: 02/12/2019  Allergies  Allergen Reactions  . Diflucan [Fluconazole] Anaphylaxis    Tongue swelling    Current Outpatient Medications  Medication Sig Dispense Refill  . ACZONE 7.5 % GEL     . anastrozole (ARIMIDEX) 1 MG tablet TAKE 1 TABLET BY MOUTH EVERY DAY 90 tablet 2  . Ascorbic Acid (VITAMIN C) 1000 MG tablet Take 1,000 mg by mouth daily.    Marland Kitchen lactobacillus acidophilus (BACID) TABS tablet Take 1 tablet by mouth 3 (three) times a week. Reported on 12/12/2015    . LORazepam (ATIVAN) 0.5 MG tablet Take 0.5 mg  by mouth at bedtime.    . Magnesium 300 MG CAPS Take 1 capsule by mouth daily.    . multivitamin-lutein (OCUVITE-LUTEIN) CAPS Take 1 capsule by mouth daily.    Marland Kitchen OVER THE COUNTER MEDICATION Vitamin D 3 2000 iu taking in the a.m.    . OVER THE COUNTER MEDICATION Flaxseed oil 1000 mg taking daily    . OVER THE COUNTER MEDICATION Epax fish oil Omega 3 taking daily    . TRAVATAN Z 0.004 % SOLN ophthalmic solution Place 1 drop into both eyes at bedtime.     No current facility-administered medications for this visit.     OBJECTIVE:  white woman who appears stated age  87:   03/21/20 1155  BP: (!) 139/96  Pulse: 95  Resp: 20  Temp: 99.1 F (37.3 C)  SpO2: 100%     Body mass index is 27.17 kg/m.   Wt Readings from Last 3 Encounters:  03/21/20 163 lb 4.8 oz (74.1 kg)  11/18/19 163 lb 9.6 oz (74.2 kg)  08/12/19 162 lb 4 oz (73.6 kg)      ECOG FS:1 - Symptomatic but completely ambulatory  Sclerae unicteric, EOMs intact Wearing a mask No cervical or supraclavicular adenopathy Lungs no rales or rhonchi Heart regular rate and rhythm Abd soft, nontender, positive bowel sounds MSK no focal spinal tenderness, no upper extremity lymphedema Neuro: nonfocal, well oriented, appropriate affect Breasts: The right breast has undergone lumpectomy followed by radiation with no evidence of disease recurrence.  The left breast is benign.  Both axillae are benign.   LAB RESULTS:  CMP     Component Value Date/Time   NA 143 03/21/2020 1143   K 4.3 03/21/2020 1143   CL 107 03/21/2020 1143   CO2 28 03/21/2020 1143   GLUCOSE 96 03/21/2020 1143   BUN 18 03/21/2020 1143   CREATININE 0.80 03/21/2020 1143   CREATININE 0.83 03/03/2019 0820   CREATININE 0.74 12/12/2015 1722   CALCIUM 9.5 03/21/2020 1143   PROT 7.2 03/21/2020 1143   ALBUMIN 4.1 03/21/2020 1143   AST 20 03/21/2020 1143   AST 16 03/03/2019 0820   ALT 18 03/21/2020 1143   ALT 16 03/03/2019 0820   ALKPHOS 84 03/21/2020 1143    BILITOT 0.7 03/21/2020 1143   BILITOT 0.8 03/03/2019 0820   GFRNONAA >60 03/21/2020 1143   GFRNONAA >60 03/03/2019 0820   GFRNONAA >89 12/12/2015 1722   GFRAA >60 03/21/2020 1143   GFRAA >60 03/03/2019 0820   GFRAA >89 12/12/2015 1722    No results found for: TOTALPROTELP,  ALBUMINELP, A1GS, A2GS, BETS, BETA2SER, GAMS, MSPIKE, SPEI  No results found for: KPAFRELGTCHN, LAMBDASER, KAPLAMBRATIO  Lab Results  Component Value Date   WBC 5.0 03/21/2020   NEUTROABS 3.6 03/21/2020   HGB 15.3 (H) 03/21/2020   HCT 46.1 (H) 03/21/2020   MCV 92.4 03/21/2020   PLT 204 03/21/2020   No results found for: LABCA2  No components found for: OTLXBW620  No results for input(s): INR in the last 168 hours.  No results found for: LABCA2  No results found for: BTD974  No results found for: BUL845  No results found for: XMI680  No results found for: CA2729  No components found for: HGQUANT  No results found for: CEA1 / No results found for: CEA1   No results found for: AFPTUMOR  No results found for: CHROMOGRNA  No results found for: HGBA, HGBA2QUANT, HGBFQUANT, HGBSQUAN (Hemoglobinopathy evaluation)   No results found for: LDH  No results found for: IRON, TIBC, IRONPCTSAT (Iron and TIBC)  No results found for: FERRITIN  Urinalysis    Component Value Date/Time   COLORURINE YELLOW 05/29/2009 1120   APPEARANCEUR CLEAR 05/29/2009 1120   LABSPEC 1.015 05/29/2009 1120   PHURINE 7.5 05/29/2009 1120   GLUCOSEU NEGATIVE 05/29/2009 1120   HGBUR NEGATIVE 05/29/2009 1120   BILIRUBINUR negative 12/12/2015 1735   BILIRUBINUR n 02/13/2015 1150   KETONESUR negative 12/12/2015 1735   KETONESUR NEGATIVE 05/29/2009 1120   PROTEINUR negative 12/12/2015 1735   PROTEINUR n 02/13/2015 1150   PROTEINUR NEGATIVE 05/29/2009 1120   UROBILINOGEN 0.2 12/12/2015 1735   UROBILINOGEN 0.2 05/29/2009 1120   NITRITE Negative 12/12/2015 1735   NITRITE n 02/13/2015 1150   NITRITE NEGATIVE 05/29/2009  1120   LEUKOCYTESUR Negative 12/12/2015 1735    STUDIES:  No results found.   ELIGIBLE FOR AVAILABLE RESEARCH PROTOCOL: no   ASSESSMENT: 59 y.o. [place], [place] woman status post right breast upper inner quadrant biopsy 02/22/2019 for a clinical T1b N0, stage IA base of ductal carcinoma, rate 1, estrogen and progesterone receptor positive, HER-2 negative by FISH, with an MIB-1 of 1%  (1) status post right lumpectomy and sentinel lymph node sampling 03/22/2019 for a pT1c pN0, stage IA invasive ductal carcinoma, with negative margins.  (a) a total of 3 sentinel lymph nodes were removed  (2) Oncotype DX score of 14 predicts a risk of recurrence outside the breast in the next 9 years of 4% if the patient's only systemic treatment is antiestrogens for 5 years.  It also predicts no benefit from chemotherapy  (3) adjuvant radiation 05/24/2019 - 07/08/2019  (a) right breast / 50.4 Gy in 28 fractions  (b) boost / 10 Gy in 5 fractions  (4) anastrozole started 04/02/2019  (a) bone density 10/17/2003 showed a T score of -2.1  (b) bone density 08/11/2008 showed a T score of -2.1  (5) genetics testing 07/19/2019 through the Common Hereditary Gene Panel offered by [facility] found no deleterious mutations in APC, ATM, AXIN2, BARD1, BMPR1A, BRCA1, BRCA2, BRIP1, CDH1, CDK4, CDKN2A (p14ARF), CDKN2A (p16INK4a), CHEK2, CTNNA1, DICER1, EPCAM (Deletion/duplication testing only), GREM1 (promoter region deletion/duplication testing only), KIT, MEN1, MLH1, MSH2, MSH3, MSH6, MUTYH, NBN, NF1, NHTL1, PALB2, PDGFRA, PMS2, POLD1, POLE, PTEN, RAD50, RAD51C, RAD51D, RNF43, SDHB, SDHC, SDHD, SMAD4, SMARCA4. STK11, TP53, TSC1, TSC2, and VHL.  The following genes were evaluated for sequence changes only: SDHA and HOXB13 c.251G>A variant only.    PLAN: Mahdiya is now a year out from definitive surgery for her breast cancer with no evidence  of disease recurrence.  This is favorable.  She is tolerating anastrozole generally  well and the plan is to continue that a minimum of 5 years.  I do not think anastrozole is the most likely cause of her high blood pressure, but if it were we would want to continue the anastrozole and start her on blood pressure medication.  The she is working on this with Dr. Laurann Montana and she will be monitoring her blood pressure at home.  We discussed her osteopenia and she is taking vitamin D but not walking as much as I would like.  We discussed a walking program for her and suggested 45 minutes a day, 5 days a week as a goal  I will see her again in 1 year after her next mammogram.  She will have a repeat bone density at the same time.  She knows to call for any other issue that may develop before the next visit.  Total encounter time 30 minutes.*  Rhonda [name], Virgie Dad, MD  03/21/20 5:45 PM Medical Oncology and Hematology [facility] [place], [place] [address] Tel. [phone]    Fax. [phone]    I, Rhonda [name], am acting as scribe for Dr. Virgie Dad. Rhonda [name].  I, Lurline Del MD, have reviewed the above documentation for accuracy and completeness, and I agree with the above.   *Total Encounter Time as defined by the Centers for Medicare and Medicaid Services includes, in addition to the face-to-face time of a patient visit (documented in the note above) non-face-to-face time: obtaining and reviewing outside history, ordering and reviewing medications, tests or procedures, care coordination (communications with other health care professionals or caregivers) and documentation in the medical record.

## 2020-03-21 ENCOUNTER — Other Ambulatory Visit: Payer: Self-pay

## 2020-03-21 ENCOUNTER — Inpatient Hospital Stay: Payer: BC Managed Care – PPO

## 2020-03-21 ENCOUNTER — Inpatient Hospital Stay: Payer: BC Managed Care – PPO | Attending: Oncology | Admitting: Oncology

## 2020-03-21 VITALS — BP 139/96 | HR 95 | Temp 99.1°F | Resp 20 | Ht 65.0 in | Wt 163.3 lb

## 2020-03-21 DIAGNOSIS — Z79899 Other long term (current) drug therapy: Secondary | ICD-10-CM | POA: Insufficient documentation

## 2020-03-21 DIAGNOSIS — I739 Peripheral vascular disease, unspecified: Secondary | ICD-10-CM | POA: Diagnosis not present

## 2020-03-21 DIAGNOSIS — C50211 Malignant neoplasm of upper-inner quadrant of right female breast: Secondary | ICD-10-CM

## 2020-03-21 DIAGNOSIS — M8580 Other specified disorders of bone density and structure, unspecified site: Secondary | ICD-10-CM

## 2020-03-21 DIAGNOSIS — F419 Anxiety disorder, unspecified: Secondary | ICD-10-CM | POA: Insufficient documentation

## 2020-03-21 DIAGNOSIS — Z8042 Family history of malignant neoplasm of prostate: Secondary | ICD-10-CM | POA: Insufficient documentation

## 2020-03-21 DIAGNOSIS — Z170 Estrogen receptor positive status [ER+]: Secondary | ICD-10-CM | POA: Insufficient documentation

## 2020-03-21 DIAGNOSIS — Z79811 Long term (current) use of aromatase inhibitors: Secondary | ICD-10-CM | POA: Insufficient documentation

## 2020-03-21 DIAGNOSIS — Z803 Family history of malignant neoplasm of breast: Secondary | ICD-10-CM | POA: Insufficient documentation

## 2020-03-21 DIAGNOSIS — E785 Hyperlipidemia, unspecified: Secondary | ICD-10-CM | POA: Insufficient documentation

## 2020-03-21 LAB — CBC WITH DIFFERENTIAL/PLATELET
Abs Immature Granulocytes: 0.02 10*3/uL (ref 0.00–0.07)
Basophils Absolute: 0 10*3/uL (ref 0.0–0.1)
Basophils Relative: 1 %
Eosinophils Absolute: 0.1 10*3/uL (ref 0.0–0.5)
Eosinophils Relative: 1 %
HCT: 46.1 % — ABNORMAL HIGH (ref 36.0–46.0)
Hemoglobin: 15.3 g/dL — ABNORMAL HIGH (ref 12.0–15.0)
Immature Granulocytes: 0 %
Lymphocytes Relative: 19 %
Lymphs Abs: 0.9 10*3/uL (ref 0.7–4.0)
MCH: 30.7 pg (ref 26.0–34.0)
MCHC: 33.2 g/dL (ref 30.0–36.0)
MCV: 92.4 fL (ref 80.0–100.0)
Monocytes Absolute: 0.4 10*3/uL (ref 0.1–1.0)
Monocytes Relative: 7 %
Neutro Abs: 3.6 10*3/uL (ref 1.7–7.7)
Neutrophils Relative %: 72 %
Platelets: 204 10*3/uL (ref 150–400)
RBC: 4.99 MIL/uL (ref 3.87–5.11)
RDW: 11.9 % (ref 11.5–15.5)
WBC: 5 10*3/uL (ref 4.0–10.5)
nRBC: 0 % (ref 0.0–0.2)

## 2020-03-21 LAB — COMPREHENSIVE METABOLIC PANEL
ALT: 18 U/L (ref 0–44)
AST: 20 U/L (ref 15–41)
Albumin: 4.1 g/dL (ref 3.5–5.0)
Alkaline Phosphatase: 84 U/L (ref 38–126)
Anion gap: 8 (ref 5–15)
BUN: 18 mg/dL (ref 6–20)
CO2: 28 mmol/L (ref 22–32)
Calcium: 9.5 mg/dL (ref 8.9–10.3)
Chloride: 107 mmol/L (ref 98–111)
Creatinine, Ser: 0.8 mg/dL (ref 0.44–1.00)
GFR calc Af Amer: >60 mL/min (ref >60)
GFR calc non Af Amer: >60 mL/min (ref >60)
Glucose, Bld: 96 mg/dL (ref 70–99)
Potassium: 4.3 mmol/L (ref 3.5–5.1)
Sodium: 143 mmol/L (ref 135–145)
Total Bilirubin: 0.7 mg/dL (ref 0.3–1.2)
Total Protein: 7.2 g/dL (ref 6.5–8.1)

## 2020-05-28 ENCOUNTER — Other Ambulatory Visit: Payer: Self-pay | Admitting: Oncology

## 2020-05-29 ENCOUNTER — Telehealth: Payer: Self-pay | Admitting: Oncology

## 2020-05-29 NOTE — Telephone Encounter (Signed)
Scheduled appt per 6/14 sch message - mailed reminder letter with appt date and time

## 2020-07-26 NOTE — Progress Notes (Signed)
59 y.o. G78P2012 Married Caucasian female here for annual exam.    Has done successful weight loss.   Will be on Arimidex for 5 years, feels a little "off" taking it.  Moody and down.  Hot flashes are improved.  Has vaginal dryness.  Using [facility] aloe vera jelly vaginally.   No vaginal bleeding.   Night time urinary frequency of 1 - 3 times per night, more if she is thinking about a lot.  No leakage with cough or sneeze.   Wearing support hose for varicose veins of her legs.  Will see Dr. Eliot Ford.   Doing BP monitoring at home for her PCP. i  Had her Covid vaccine in March, 2021.  Pfizer.   Son and his wife are back home from [place]. They are looking for employment.  PCP:  Lavone Orn, MD  Patient's last menstrual period was 04/05/2016 (approximate).           Sexually active: No --only rarely The current method of family planning is post menopausal status.    Exercising: Yes.    power walks 3x/week Smoker:  no  Health Maintenance: Pap: 03-12-17 Neg:Neg HR HPV, 02-13-15 Neg:Neg HR HPV History of abnormal Pap:  Yes, ASCUS 2014 MMG: 02-14-20 Hx Rt.Br.CA/Diag.Bil.w/Lt.US--Benign Lt.Br.cysts/Neg/density C/biRads2/Bil.Diag.44yr. Colonoscopy: 05/2017 normal;next 10years   BMD: 2009 Result :Normal--Dr.Magrinat to schedule TDaP:  12/2016 Gardasil:   no HIV:2017 NR Hep C: 2027 Neg Screening Labs:  PCP.    reports that she has never smoked. She has never used smokeless tobacco. She reports current alcohol use of about 2.0 standard drinks of alcohol per week. She reports that she does not use drugs.  Past Medical History:  Diagnosis Date  . Allergy    seasonal  . Anxiety   . Cancer ([place]) 02/2019   right breast IDC  . Family history of breast cancer   . Family history of prostate cancer   . Fibroid   . Glaucoma   . Leg pain, bilateral   . Lung nodules    benign  . Other and unspecified hyperlipidemia 04/05/2013  . Peripheral vascular disease (HCC)    chronic  venous insufficiency bilaterally  . Personal history of radiation therapy   . PONV (postoperative nausea and vomiting)   . Varicose veins of both lower extremities     Past Surgical History:  Procedure Laterality Date  . BLADDER SUSPENSION  2010   Dr.Silva  . BREAST CYST ASPIRATION Right 09/08/2012  . BREAST LUMPECTOMY Right 03/19/2019  . BREAST LUMPECTOMY WITH RADIOACTIVE SEED AND SENTINEL LYMPH NODE BIOPSY Right 03/22/2019   Procedure: RIGHT BREAST LUMPECTOMY WITH RADIOACTIVE SEED AND RIGHT AXILLARY SENTINEL LYMPH NODE BIOPSY;  Surgeon: Alphonsa Overall, MD;  Location: [facility];  Service: General;  Laterality: Right;  . INSERTION OF MESH N/A 11/01/2016   Procedure: INSERTION OF MESH;  Surgeon: Jackolyn Confer, MD;  Location: [facility];  Service: General;  Laterality: N/A;  . RHINOPLASTY    . TONSILLECTOMY    . varicose veins    . [place]  . VENTRAL HERNIA REPAIR N/A 11/01/2016   Procedure: VENTRAL HERNIA REPAIR WITH MESH;  Surgeon: Jackolyn Confer, MD;  Location: [facility];  Service: General;  Laterality: N/A;    Current Outpatient Medications  Medication Sig Dispense Refill  . ACZONE 7.5 % GEL     . anastrozole (ARIMIDEX) 1 MG tablet TAKE 1 TABLET BY MOUTH EVERY DAY 90 tablet 2  . Ascorbic Acid (VITAMIN  C) 1000 MG tablet Take 1,000 mg by mouth daily.    . cholecalciferol (VITAMIN D3) 25 MCG (1000 UNIT) tablet Take 2 tablets (2,000 Units total) by mouth daily.    Marland Kitchen lactobacillus acidophilus (BACID) TABS tablet Take 1 tablet by mouth 3 (three) times a week. Reported on 12/12/2015    . LORazepam (ATIVAN) 0.5 MG tablet Take 0.5 mg by mouth at bedtime.    . Magnesium 300 MG CAPS Take 1 capsule by mouth daily.    . multivitamin-lutein (OCUVITE-LUTEIN) CAPS Take 1 capsule by mouth daily.    Marland Kitchen OVER THE COUNTER MEDICATION Vitamin D 3 2000 iu taking in the a.m.    . OVER THE COUNTER MEDICATION Flaxseed oil 1000 mg taking daily    . OVER THE COUNTER  MEDICATION Epax fish oil Omega 3 taking daily    . TRAVATAN Z 0.004 % SOLN ophthalmic solution Place 1 drop into both eyes at bedtime.     No current facility-administered medications for this visit.    Family History  Problem Relation Age of Onset  . Cancer Maternal Grandmother        breast cancer  . Breast cancer Maternal Grandmother        unsure of age  . Heart disease Maternal Grandfather   . Stroke Brother   . Alcohol abuse Brother   . Liver disease Brother   . Varicose Veins Brother   . Heart disease Brother   . Cancer Mother   . Tracheal cancer Mother   . Varicose Veins Father   . Heart disease Father   . Prostate cancer Father   . Heart attack Brother   . Mental illness Brother        d. 62  . Varicose Veins Paternal Grandmother   . Prostate cancer Brother        possible, unsure if BPA vs prostate cancer    Review of Systems  All other systems reviewed and are negative.   Exam:   BP (!) 148/82   Pulse 66   Resp 14   Ht 5' 4.25" (1.632 m)   Wt 162 lb 6.4 oz (73.7 kg)   LMP 04/05/2016 (Approximate)   BMI 27.66 kg/m     General appearance: alert, cooperative and appears stated age Head: normocephalic, without obvious abnormality, atraumatic Neck: no adenopathy, supple, symmetrical, trachea midline and thyroid normal to inspection and palpation Lungs: clear to auscultation bilaterally Breasts: normal appearance, no masses or tenderness, No nipple retraction or dimpling, No nipple discharge or bleeding, No axillary adenopathy Heart: regular rate and rhythm Abdomen: soft, non-tender; no masses, no organomegaly Extremities: extremities normal, atraumatic, no cyanosis or edema Skin: skin color, texture, turgor normal. No rashes or lesions Lymph nodes: cervical, supraclavicular, and axillary nodes normal. Neurologic: grossly normal  Pelvic: External genitalia:  no lesions              No abnormal inguinal nodes palpated.              Urethra:  normal  appearing urethra with no masses, tenderness or lesions              Bartholins and Skenes: normal                 Vagina: normal appearing vagina with normal color and discharge, no lesions              Cervix: no lesions  Pap taken: No. Bimanual Exam:  Uterus:  normal size, contour, position, consistency, mobility, non-tender              Adnexa: no mass, fullness, tenderness              Rectal exam: Yes.  .  Confirms.              Anus:  normal sphincter tone, no lesions  Chaperone was present for exam.  Assessment:   Well woman visit with normal exam. Postmenopausal female. Hx of mid-urethral sling.  Remote hx of ASCUS.Follow up normal. Remote hx of osteopenia.Follow up BMD normal per patient. Hx varicose veins.  Right breast cancer.  Status post lumpectomy with sentinal node, and XRT.  On Arimidex. Negative genetic testing.   Plan: Mammogram screening discussed. Self breast awareness reviewed. Pap and HR HPV as above. Guidelines for Calcium, Vitamin D, regular exercise program including cardiovascular and weight bearing exercise. Follow up of breast cancer with surgeon and oncologist yearly.  BMD through oncology.  Try vitamin E suppositories for the vagina.  Follow up annually and prn.   After visit summary provided.

## 2020-07-27 ENCOUNTER — Ambulatory Visit (INDEPENDENT_AMBULATORY_CARE_PROVIDER_SITE_OTHER): Payer: BC Managed Care – PPO | Admitting: Obstetrics and Gynecology

## 2020-07-27 ENCOUNTER — Encounter: Payer: Self-pay | Admitting: Obstetrics and Gynecology

## 2020-07-27 ENCOUNTER — Other Ambulatory Visit: Payer: Self-pay

## 2020-07-27 VITALS — BP 148/82 | HR 66 | Resp 14 | Ht 64.25 in | Wt 162.4 lb

## 2020-07-27 DIAGNOSIS — Z01419 Encounter for gynecological examination (general) (routine) without abnormal findings: Secondary | ICD-10-CM | POA: Diagnosis not present

## 2020-07-27 NOTE — Patient Instructions (Signed)

## 2020-07-31 DIAGNOSIS — H401131 Primary open-angle glaucoma, bilateral, mild stage: Secondary | ICD-10-CM | POA: Diagnosis not present

## 2020-09-27 DIAGNOSIS — Z23 Encounter for immunization: Secondary | ICD-10-CM | POA: Diagnosis not present

## 2020-10-04 ENCOUNTER — Encounter: Payer: Self-pay | Admitting: General Practice

## 2020-10-04 NOTE — Progress Notes (Signed)
[place] CSW Progress Notes  Patient left VM, wants resources for her son who has recently moved back to this area after completing a doctoral program.  Concerned about his mental health, wanted resources.  [facility] [place] [facility] - family tried to access care there but facility did not take his insurance.  His insurance provider referred him to [facility] in [facility] - he is in the process of becoming linked with this provider.  Edwyna Shell, LCSW Clinical Social Worker Phone:  [phone]

## 2020-11-02 DIAGNOSIS — Z170 Estrogen receptor positive status [ER+]: Secondary | ICD-10-CM | POA: Diagnosis not present

## 2020-11-02 DIAGNOSIS — C50911 Malignant neoplasm of unspecified site of right female breast: Secondary | ICD-10-CM | POA: Diagnosis not present

## 2020-11-17 ENCOUNTER — Ambulatory Visit: Payer: BC Managed Care – PPO | Attending: Internal Medicine

## 2020-11-17 DIAGNOSIS — Z23 Encounter for immunization: Secondary | ICD-10-CM

## 2020-11-17 NOTE — Progress Notes (Signed)
° °  Covid-19 Vaccination Clinic  Name:  Rhonda [name]    MRN: [id number] DOB: 24-Feb-1961  11/17/2020  [name] was observed post Covid-19 immunization for 30 minutes based on pre-vaccination screening without incident. She was provided with Vaccine Information Sheet and instruction to access the V-Safe system.   [name] was instructed to call 911 with any severe reactions post vaccine:  Difficulty breathing   Swelling of face and throat   A fast heartbeat   A bad rash all over body   Dizziness and weakness   Immunizations Administered    Name Date Dose VIS Date Route   Pfizer COVID-19 Vaccine 11/17/2020  1:21 PM 0.3 mL 10/04/2020 Intramuscular   Manufacturer: [place]   Lot: [id number]   NDC: [place]

## 2020-11-22 ENCOUNTER — Other Ambulatory Visit: Payer: Self-pay | Admitting: Oncology

## 2020-11-22 DIAGNOSIS — Z9889 Other specified postprocedural states: Secondary | ICD-10-CM

## 2021-01-30 DIAGNOSIS — F419 Anxiety disorder, unspecified: Secondary | ICD-10-CM | POA: Diagnosis not present

## 2021-01-30 DIAGNOSIS — Z0000 Encounter for general adult medical examination without abnormal findings: Secondary | ICD-10-CM | POA: Diagnosis not present

## 2021-01-30 DIAGNOSIS — Z853 Personal history of malignant neoplasm of breast: Secondary | ICD-10-CM | POA: Diagnosis not present

## 2021-01-31 DIAGNOSIS — H401131 Primary open-angle glaucoma, bilateral, mild stage: Secondary | ICD-10-CM | POA: Diagnosis not present

## 2021-01-31 DIAGNOSIS — H2513 Age-related nuclear cataract, bilateral: Secondary | ICD-10-CM | POA: Diagnosis not present

## 2021-02-15 ENCOUNTER — Ambulatory Visit
Admission: RE | Admit: 2021-02-15 | Discharge: 2021-02-15 | Disposition: A | Discharge status: Home / Self Care | Payer: BC Managed Care – PPO | Source: Ambulatory Visit | Attending: Oncology | Admitting: Oncology

## 2021-02-15 ENCOUNTER — Other Ambulatory Visit: Payer: Self-pay

## 2021-02-15 DIAGNOSIS — Z9889 Other specified postprocedural states: Secondary | ICD-10-CM

## 2021-02-15 DIAGNOSIS — R922 Inconclusive mammogram: Secondary | ICD-10-CM | POA: Diagnosis not present

## 2021-02-23 ENCOUNTER — Other Ambulatory Visit: Payer: Self-pay | Admitting: Oncology

## 2021-03-28 NOTE — Progress Notes (Signed)
[facility]  [phone] [phone]    ID: Rhonda [name] DOB: [birth year]  MR#: [id number]  SNK#:[id number]  Patient Care Team: Lavone Orn, MD as PCP - General (Internal Medicine) Mauro Kaufmann, RN as Oncology Nurse Navigator Rockwell Germany, RN as Oncology Nurse Navigator Alphonsa Overall, MD as Consulting Physician (General Surgery) Magrinat, Virgie Dad, MD as Consulting Physician (Oncology) Gery Pray, MD as Consulting Physician (Radiation Oncology) Nunzio Cobbs, MD as Consulting Physician (Obstetrics and Gynecology) Macario Carls, MD as Referring Physician (Dermatology) Katy Apo, MD as Consulting Physician (Ophthalmology) Linus Mako, MD as Consulting Physician (Vascular Surgery) OTHER MD:   CHIEF COMPLAINT: Estrogen receptor positive breast cancer  CURRENT TREATMENT: anastrozole   INTERVAL HISTORY: Rhonda [name] returns today for follow-up of her estrogen receptor positive breast cancer.  She continues on anastrozole.  She is tolerating this well, with no significant side effects that she is aware of.    Her last bone density screening was in 2009. She does not want to have a bone density at this point because she is worried that she has had so many studies and so much radiation in the past year or 2.  Since her last visit, she underwent bilateral diagnostic mammography with tomography at [facility] on 02/15/2021 showing: breast density category C; no evidence of malignancy in either breast.    REVIEW OF SYSTEMS: Rhonda [name] walks about 4 miles with her husband on a 3 times a week.  They like to do hiking trails.  She is very assiduous in taking her vitamin D and calcium as well.  She is having some memories of her mother's severe and disfiguring surgeries which happen when Rhonda [name] was 6 or 7.  She of course has still had some family tragedies to deal with.  She tells me she does not do well with antidepressants and did not  tolerate Effexor/venlafaxine in the past.  She is using lorazepam rarely.  Psych from these issues a detailed review of systems today was stable.   COVID 19 VACCINATION STATUS:  Fully vaccinated [employer]), with booster 11/2020   HISTORY OF CURRENT ILLNESS: From the original intake note:  Rhonda [name] had routine screening mammography on 02/12/2019 showing a possible abnormality in the right breast. She underwent unilateral right diagnostic mammography with tomography and right breast ultrasonography at [facility] on 02/17/2019 showing: Breast Density Category C. a persistent irregular mass measuring approximately 0.8 cm. Ultrasound of the right breast at 12 o'clock, 5 cm from the nipple demonstrates an irregular hypoechoic mass with indistinct margins measuring 0.7 x 0.5 x 0.7 cm. No blood flow seen within the mass on color Doppler imaging. Ultrasound of the right axilla demonstrates multiple normal-appearing nodes.  Accordingly on 02/22/2019 she proceeded to biopsy of the right breast area in question. 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 Prognostic indicators significant for: estrogen receptor, 100% positive and progesterone receptor, 100% positive, both with strong staining intensity. Proliferation marker Ki67 at 1%. HER2 equivocal (2+) by immunohistochemistry but negative by fluorescent in situ hybridization with a signals ratio 1.32 and number per cell 1.85.   The patient's subsequent history is as detailed below.   PAST MEDICAL HISTORY: Past Medical History:  Diagnosis Date  . Allergy    seasonal  . Anxiety   . Cancer ([facility]) 02/2019   right breast IDC  . Family history of breast cancer   . Family  history of prostate cancer   . Fibroid   . Glaucoma   . Leg pain, bilateral   . Lung nodules    benign  . Other and unspecified hyperlipidemia 04/05/2013  . Peripheral vascular disease ([facility])    chronic  venous insufficiency bilaterally  . Personal history of radiation therapy   . PONV (postoperative nausea and vomiting)   . Varicose veins of both lower extremities     PAST SURGICAL HISTORY: Past Surgical History:  Procedure Laterality Date  . BLADDER SUSPENSION  2010   Dr.Silva  . BREAST CYST ASPIRATION Right 09/08/2012  . BREAST LUMPECTOMY Right 03/19/2019  . BREAST LUMPECTOMY WITH RADIOACTIVE SEED AND SENTINEL LYMPH NODE BIOPSY Right 03/22/2019   Procedure: RIGHT BREAST LUMPECTOMY WITH RADIOACTIVE SEED AND RIGHT AXILLARY SENTINEL LYMPH NODE BIOPSY;  Surgeon: Alphonsa Overall, MD;  Location: [facility];  Service: General;  Laterality: Right;  . INSERTION OF MESH N/A 11/01/2016   Procedure: INSERTION OF MESH;  Surgeon: Jackolyn Confer, MD;  Location: [facility];  Service: General;  Laterality: N/A;  . RHINOPLASTY    . TONSILLECTOMY    . varicose veins    . [place]  . VENTRAL HERNIA REPAIR N/A 11/01/2016   Procedure: VENTRAL HERNIA REPAIR WITH MESH;  Surgeon: Jackolyn Confer, MD;  Location: [facility];  Service: General;  Laterality: N/A;    FAMILY HISTORY: Family History  Problem Relation Age of Onset  . Cancer Maternal Grandmother        breast cancer  . Breast cancer Maternal Grandmother        unsure of age  . Heart disease Maternal Grandfather   . Stroke Brother   . Alcohol abuse Brother   . Liver disease Brother   . Varicose Veins Brother   . Heart disease Brother   . Cancer Mother   . Tracheal cancer Mother   . Varicose Veins Father   . Heart disease Father   . Prostate cancer Father   . Heart attack Brother   . Mental illness Brother        d. 87  . Varicose Veins Paternal Grandmother   . Prostate cancer Brother        possible, unsure if BPA vs prostate cancer  Rhonda [name]'s father died from congestive heart failure at age 75. Patients' mother died from tracheal carcinoma at age 37. The patient has 4 brothers and 1 sister. Patient denies  anyone in her family having ovarian, or pancreatic cancer. Rhonda [name]'s mother was diagnosed with tracheal carcinoma and acoustic neuroma at age 26. Rhonda [name]'s father was diagnosed with prostate cancer in his 50's. Rhonda [name]'s brother was diagnosed with prostate cancer in his mid 69's. [place]'s maternal grandmother was diagnosed with breast cancer in her late 82's.    GYNECOLOGIC HISTORY:  Patient's last menstrual period was 04/05/2016 (approximate). 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 Her husband, Richardson Landry, is a retired Community education officer for [employer]. They have two children, Copywriter, advertising and [employer]. Remo Lipps is 43, lives in [place], [place], and is a music professor. Cherly Beach is 70, lives in [place], [place], and recently graduated with a degree in business.  The patient attends our [employer].   ADVANCED DIRECTIVES: In the absence of  any documentation, Margrett's spouse, Richardson Landry, is her healthcare power of attorney.      HEALTH MAINTENANCE: Social History   Tobacco Use  . Smoking status: Never Smoker  . Smokeless tobacco: Never Used  Vaping Use  . Vaping Use: Never used  Substance Use Topics  . Alcohol use: Yes    Alcohol/week: 2.0 standard drinks    Types: 2 Glasses of wine per week  . Drug use: No    Colonoscopy: yes, 05/2017, normal; Dr. Darrel Hoover   PAP: 03/2017  Bone density: August 11, 2008, showed a T score of -2.1 Mammography: 02/12/2019  Allergies  Allergen Reactions  . Diflucan [Fluconazole] Anaphylaxis    Tongue swelling    Current Outpatient Medications  Medication Sig Dispense Refill  . ACZONE 7.5 % GEL     . anastrozole (ARIMIDEX) 1 MG tablet TAKE 1 TABLET BY MOUTH EVERY DAY 90 tablet 2  . Ascorbic Acid (VITAMIN C) 1000 MG tablet Take 1,000 mg by mouth daily.     . cholecalciferol (VITAMIN D3) 25 MCG (1000 UNIT) tablet Take 2 tablets (2,000 Units total) by mouth daily.    Marland Kitchen lactobacillus acidophilus (BACID) TABS tablet Take 1 tablet by mouth 3 (three) times a week. Reported on 12/12/2015    . LORazepam (ATIVAN) 0.5 MG tablet Take 0.5 mg by mouth at bedtime.    . Magnesium 300 MG CAPS Take 1 capsule by mouth daily.    . multivitamin-lutein (OCUVITE-LUTEIN) CAPS Take 1 capsule by mouth daily.    Marland Kitchen OVER THE COUNTER MEDICATION Vitamin D 3 2000 iu taking in the a.m.    . OVER THE COUNTER MEDICATION Flaxseed oil 1000 mg taking daily    . OVER THE COUNTER MEDICATION Epax fish oil Omega 3 taking daily    . TRAVATAN Z 0.004 % SOLN ophthalmic solution Place 1 drop into both eyes at bedtime.     No current facility-administered medications for this visit.    OBJECTIVE:  white woman who appears stated age  72:   03/29/21 1335  BP: (!) 155/90  Pulse: (!) 108  Temp: 97.8 F (36.6 C)  SpO2: 100%     Body mass index is 28.6 kg/m.   Wt Readings from Last 3 Encounters:  03/29/21 167 lb 14.4 oz (76.2 kg)  07/27/20 162 lb 6.4 oz (73.7 kg)  03/21/20 163 lb 4.8 oz (74.1 kg)   In addition to the vitals above the patient brought me a list of her blood pressures which she takes in the morning most days.  Control is adequate.    ECOG FS:1 - Symptomatic but completely ambulatory  Sclerae unicteric, EOMs intact Wearing a mask No cervical or supraclavicular adenopathy Lungs no rales or rhonchi Heart regular rate and rhythm Abd soft, nontender, positive bowel sounds MSK no focal spinal tenderness, no upper extremity lymphedema Neuro: nonfocal, well oriented, slight pressured speech but overall appropriate affect Breasts: The right breast is status postlumpectomy and radiation.  There is no evidence of disease recurrence.  The left breast and both axillae are benign   LAB RESULTS:  CMP     Component Value Date/Time   NA 143 03/21/2020 1143   K 4.3  03/21/2020 1143   CL 107 03/21/2020 1143   CO2 28 03/21/2020 1143   GLUCOSE 96 03/21/2020 1143   BUN 18 03/21/2020 1143   CREATININE 0.80 03/21/2020 1143   CREATININE 0.83 03/03/2019 0820   CREATININE 0.74 12/12/2015 1722   CALCIUM 9.5 03/21/2020 1143  PROT 7.2 03/21/2020 1143   ALBUMIN 4.1 03/21/2020 1143   AST 20 03/21/2020 1143   AST 16 03/03/2019 0820   ALT 18 03/21/2020 1143   ALT 16 03/03/2019 0820   ALKPHOS 84 03/21/2020 1143   BILITOT 0.7 03/21/2020 1143   BILITOT 0.8 03/03/2019 0820   GFRNONAA >60 03/21/2020 1143   GFRNONAA >60 03/03/2019 0820   GFRNONAA >89 12/12/2015 1722   GFRAA >60 03/21/2020 1143   GFRAA >60 03/03/2019 0820   GFRAA >89 12/12/2015 1722    No results found for: TOTALPROTELP, ALBUMINELP, A1GS, A2GS, BETS, BETA2SER, GAMS, MSPIKE, SPEI  No results found for: KPAFRELGTCHN, LAMBDASER, KAPLAMBRATIO  Lab Results  Component Value Date   WBC 5.0 03/21/2020   NEUTROABS 3.6 03/21/2020   HGB 15.3 (H) 03/21/2020   HCT 46.1 (H) 03/21/2020   MCV 92.4 03/21/2020   PLT 204 03/21/2020   No results found for: LABCA2  No components found for: OVFIEP329  No results for input(s): INR in the last 168 hours.  No results found for: LABCA2  No results found for: JJO841  No results found for: YSA630  No results found for: ZSW109  No results found for: CA2729  No components found for: HGQUANT  No results found for: CEA1 / No results found for: CEA1   No results found for: AFPTUMOR  No results found for: CHROMOGRNA  No results found for: HGBA, HGBA2QUANT, HGBFQUANT, HGBSQUAN (Hemoglobinopathy evaluation)   No results found for: LDH  No results found for: IRON, TIBC, IRONPCTSAT (Iron and TIBC)  No results found for: FERRITIN  Urinalysis    Component Value Date/Time   COLORURINE YELLOW 05/29/2009 1120   APPEARANCEUR CLEAR 05/29/2009 1120   LABSPEC 1.015 05/29/2009 1120   PHURINE 7.5 05/29/2009 1120   GLUCOSEU NEGATIVE 05/29/2009 1120    HGBUR NEGATIVE 05/29/2009 1120   BILIRUBINUR negative 12/12/2015 1735   BILIRUBINUR n 02/13/2015 1150   KETONESUR negative 12/12/2015 1735   KETONESUR NEGATIVE 05/29/2009 1120   PROTEINUR negative 12/12/2015 1735   PROTEINUR n 02/13/2015 1150   PROTEINUR NEGATIVE 05/29/2009 1120   UROBILINOGEN 0.2 12/12/2015 1735   UROBILINOGEN 0.2 05/29/2009 1120   NITRITE Negative 12/12/2015 1735   NITRITE n 02/13/2015 1150   NITRITE NEGATIVE 05/29/2009 1120   LEUKOCYTESUR Negative 12/12/2015 1735    STUDIES:  No results found.   ELIGIBLE FOR AVAILABLE RESEARCH PROTOCOL: no   ASSESSMENT: 59 y.o. Rhonda [name], [place] woman status post right breast upper inner quadrant biopsy 02/22/2019 for a clinical T1b N0, stage IA base of ductal carcinoma, rate 1, estrogen and progesterone receptor positive, HER-2 negative by FISH, with an MIB-1 of 1%  (1) status post right lumpectomy and sentinel lymph node sampling 03/22/2019 for a pT1c pN0, stage IA invasive ductal carcinoma, with negative margins.  (a) a total of 3 sentinel lymph nodes were removed  (2) Oncotype DX score of 14 predicts a risk of recurrence outside the breast in the next 9 years of 4% if the patient's only systemic treatment is antiestrogens for 5 years.  It also predicts no benefit from chemotherapy  (3) adjuvant radiation 05/24/2019 - 07/08/2019  (a) right breast / 50.4 Gy in 28 fractions  (b) boost / 10 Gy in 5 fractions  (4) anastrozole started 04/02/2019  (a) bone density 10/17/2003 showed a T score of -2.1  (b) bone density 08/11/2008 showed a T score of -2.1  (5) genetics testing 07/19/2019 through the Common Hereditary Gene Panel offered by [facility] found no deleterious  mutations in APC, ATM, AXIN2, BARD1, BMPR1A, BRCA1, BRCA2, BRIP1, CDH1, CDK4, CDKN2A (p14ARF), CDKN2A (p16INK4a), CHEK2, CTNNA1, DICER1, EPCAM (Deletion/duplication testing only), GREM1 (promoter region deletion/duplication testing only), KIT, MEN1, MLH1, MSH2, MSH3,  MSH6, MUTYH, NBN, NF1, NHTL1, PALB2, PDGFRA, PMS2, POLD1, POLE, PTEN, RAD50, RAD51C, RAD51D, RNF43, SDHB, SDHC, SDHD, SMAD4, SMARCA4. STK11, TP53, TSC1, TSC2, and VHL.  The following genes were evaluated for sequence changes only: SDHA and HOXB13 c.251G>A variant only.    PLAN: Brittny is now 2 years out from definitive surgery for her breast cancer with no evidence of disease recurrence.  This is very favorable.  She is tolerating anastrozole well and the plan will be to continue that a total of 5 years.  Normally I would have obtained a bone density before now but she is reluctant.  She is very compliant with walking vitamin D and calcium.  Perhaps after another year or so she may agree to letting us measure her bone density.  However she says even if she was found to be osteoporotic she would not want to take bisphosphonates or denosumab so anyway perhaps there is no real gain in obtaining that test.  We discussed antidepressants in the handling of posttraumatic stress.  At this point she would like to defer.  She will see [place] again May 2023.  She knows to call for any other issue that may develop before then  Total encounter time 25 minutes.*   Magrinat, Virgie Dad, MD  03/29/21 1:37 PM Medical Oncology and Hematology [facility] [place], [place] [address] Tel. [phone]    Fax. [phone]    I, Wilburn Mylar, am acting as scribe for Dr. Virgie Dad. Magrinat.  I, Lurline Del MD, have reviewed the above documentation for accuracy and completeness, and I agree with the above.   *Total Encounter Time as defined by the Centers for Medicare and Medicaid Services includes, in addition to the face-to-face time of a patient visit (documented in the note above) non-face-to-face time: obtaining and reviewing outside history, ordering and reviewing medications, tests or procedures, care coordination (communications with other health care professionals or  caregivers) and documentation in the medical record.

## 2021-03-29 ENCOUNTER — Inpatient Hospital Stay: Payer: BC Managed Care – PPO | Attending: Oncology | Admitting: Oncology

## 2021-03-29 ENCOUNTER — Inpatient Hospital Stay: Payer: BC Managed Care – PPO

## 2021-03-29 ENCOUNTER — Other Ambulatory Visit: Payer: Self-pay

## 2021-03-29 VITALS — BP 155/90 | HR 108 | Temp 97.8°F | Ht 64.25 in | Wt 167.9 lb

## 2021-03-29 DIAGNOSIS — Z923 Personal history of irradiation: Secondary | ICD-10-CM | POA: Diagnosis not present

## 2021-03-29 DIAGNOSIS — E785 Hyperlipidemia, unspecified: Secondary | ICD-10-CM | POA: Diagnosis not present

## 2021-03-29 DIAGNOSIS — Z8042 Family history of malignant neoplasm of prostate: Secondary | ICD-10-CM | POA: Insufficient documentation

## 2021-03-29 DIAGNOSIS — C50211 Malignant neoplasm of upper-inner quadrant of right female breast: Secondary | ICD-10-CM | POA: Insufficient documentation

## 2021-03-29 DIAGNOSIS — M8580 Other specified disorders of bone density and structure, unspecified site: Secondary | ICD-10-CM

## 2021-03-29 DIAGNOSIS — Z800 Family history of malignant neoplasm of digestive organs: Secondary | ICD-10-CM | POA: Diagnosis not present

## 2021-03-29 DIAGNOSIS — Z79811 Long term (current) use of aromatase inhibitors: Secondary | ICD-10-CM | POA: Diagnosis not present

## 2021-03-29 DIAGNOSIS — Z170 Estrogen receptor positive status [ER+]: Secondary | ICD-10-CM | POA: Diagnosis not present

## 2021-03-29 DIAGNOSIS — Z803 Family history of malignant neoplasm of breast: Secondary | ICD-10-CM | POA: Insufficient documentation

## 2021-03-29 LAB — CBC WITH DIFFERENTIAL/PLATELET
Abs Immature Granulocytes: 0.05 10*3/uL (ref 0.00–0.07)
Basophils Absolute: 0 10*3/uL (ref 0.0–0.1)
Basophils Relative: 1 %
Eosinophils Absolute: 0.1 10*3/uL (ref 0.0–0.5)
Eosinophils Relative: 1 %
HCT: 44.6 % (ref 36.0–46.0)
Hemoglobin: 14.9 g/dL (ref 12.0–15.0)
Immature Granulocytes: 1 %
Lymphocytes Relative: 12 %
Lymphs Abs: 0.9 10*3/uL (ref 0.7–4.0)
MCH: 30.1 pg (ref 26.0–34.0)
MCHC: 33.4 g/dL (ref 30.0–36.0)
MCV: 90.1 fL (ref 80.0–100.0)
Monocytes Absolute: 0.5 10*3/uL (ref 0.1–1.0)
Monocytes Relative: 6 %
Neutro Abs: 6.6 10*3/uL (ref 1.7–7.7)
Neutrophils Relative %: 79 %
Platelets: 222 10*3/uL (ref 150–400)
RBC: 4.95 MIL/uL (ref 3.87–5.11)
RDW: 11.9 % (ref 11.5–15.5)
WBC: 8.1 10*3/uL (ref 4.0–10.5)
nRBC: 0 % (ref 0.0–0.2)

## 2021-03-29 LAB — COMPREHENSIVE METABOLIC PANEL
ALT: 21 U/L (ref 0–44)
AST: 22 U/L (ref 15–41)
Albumin: 4.2 g/dL (ref 3.5–5.0)
Alkaline Phosphatase: 80 U/L (ref 38–126)
Anion gap: 13 (ref 5–15)
BUN: 23 mg/dL (ref 8–23)
CO2: 25 mmol/L (ref 22–32)
Calcium: 9.3 mg/dL (ref 8.9–10.3)
Chloride: 105 mmol/L (ref 98–111)
Creatinine, Ser: 0.78 mg/dL (ref 0.44–1.00)
GFR, Estimated: >60 mL/min (ref >60)
Glucose, Bld: 104 mg/dL — ABNORMAL HIGH (ref 70–99)
Potassium: 4 mmol/L (ref 3.5–5.1)
Sodium: 143 mmol/L (ref 135–145)
Total Bilirubin: 0.6 mg/dL (ref 0.3–1.2)
Total Protein: 7.3 g/dL (ref 6.5–8.1)

## 2021-04-02 ENCOUNTER — Encounter: Payer: Self-pay | Admitting: Oncology

## 2021-04-18 ENCOUNTER — Telehealth: Payer: Self-pay | Admitting: Hematology and Oncology

## 2021-04-18 NOTE — Telephone Encounter (Signed)
R/s per 4/14 los, Patient aware

## 2021-04-19 DIAGNOSIS — Z23 Encounter for immunization: Secondary | ICD-10-CM | POA: Diagnosis not present

## 2021-05-10 ENCOUNTER — Encounter: Payer: Self-pay | Admitting: Oncology

## 2021-07-30 DIAGNOSIS — I10 Essential (primary) hypertension: Secondary | ICD-10-CM | POA: Diagnosis not present

## 2021-07-30 DIAGNOSIS — Z23 Encounter for immunization: Secondary | ICD-10-CM | POA: Diagnosis not present

## 2021-07-31 NOTE — Progress Notes (Signed)
60 y.o. G61P2012 Married Caucasian female here for annual exam.    Just had her second Shingrix vaccine.   On Arimidex.  Feels tired.  Exercise is really helping her to feel well.   Good control of her bladder function.  Up once to twice a night to void.  Bowel function is regular.   Has some vaginal dryness.  Uses aloe vera gel.  Just started on Norvasc.   Received her second Covid booster.   PCP:  Lavone Orn, MD   Patient's last menstrual period was 04/05/2016 (approximate).           Sexually active: Yes.   occ The current method of family planning is post menopausal status.    Exercising: Yes.     Walks 10-12 miles/week Smoker:  no  Health Maintenance: Pap:  03-12-17 Neg:Neg HR HPV, 02-13-15 Neg:Neg HR HPV History of abnormal Pap:  2014 Hx of ASCUS MMG: 02-15-21 Diag.Bil/Neg/BiRads2 Colonoscopy: 2018 normal;next 10 years BMD: 08/11/08: Result:  Normal TDaP: 2018 Gardasil:   no HIV:2017 NR Hep C: 2017 Neg Screening Labs:  PCP.   reports that she has never smoked. She has never used smokeless tobacco. She reports current alcohol use of about 1.0 standard drink per week. She reports that she does not use drugs.  Past Medical History:  Diagnosis Date   Allergy    seasonal   Anxiety    Cancer ([facility]) 02/2019   right breast IDC   Family history of breast cancer    Family history of prostate cancer    Fibroid    Glaucoma    Leg pain, bilateral    Lung nodules    benign   Other and unspecified hyperlipidemia 04/05/2013   Peripheral vascular disease ([facility])    chronic venous insufficiency bilaterally   Personal history of radiation therapy    PONV (postoperative nausea and vomiting)    Varicose veins of both lower extremities     Past Surgical History:  Procedure Laterality Date   BLADDER SUSPENSION  2010   Dr.Silva   BREAST CYST ASPIRATION Right 09/08/2012   BREAST LUMPECTOMY Right 03/19/2019   BREAST LUMPECTOMY WITH RADIOACTIVE SEED AND SENTINEL LYMPH NODE  BIOPSY Right 03/22/2019   Procedure: RIGHT BREAST LUMPECTOMY WITH RADIOACTIVE SEED AND RIGHT AXILLARY SENTINEL LYMPH NODE BIOPSY;  Surgeon: Alphonsa Overall, MD;  Location: [facility];  Service: General;  Laterality: Right;   INSERTION OF MESH N/A 11/01/2016   Procedure: INSERTION OF MESH;  Surgeon: Jackolyn Confer, MD;  Location: [facility];  Service: General;  Laterality: N/A;   RHINOPLASTY     TONSILLECTOMY     varicose veins     VEIN LIGATION AND STRIPPING  1995   VENTRAL HERNIA REPAIR N/A 11/01/2016   Procedure: VENTRAL HERNIA REPAIR WITH MESH;  Surgeon: Jackolyn Confer, MD;  Location: [facility];  Service: General;  Laterality: N/A;    Current Outpatient Medications  Medication Sig Dispense Refill   amLODipine (NORVASC) 5 MG tablet Take 5 mg by mouth daily.     anastrozole (ARIMIDEX) 1 MG tablet TAKE 1 TABLET BY MOUTH EVERY DAY 90 tablet 2   Ascorbic Acid (VITAMIN C) 1000 MG tablet Take 1,000 mg by mouth daily.     cholecalciferol (VITAMIN D3) 25 MCG (1000 UNIT) tablet Take 2 tablets (2,000 Units total) by mouth daily.     lactobacillus acidophilus (BACID) TABS tablet Take 1 tablet by mouth 3 (three) times a week. Reported on 12/12/2015  LORazepam (ATIVAN) 0.5 MG tablet Take 0.5 mg by mouth at bedtime.     Magnesium 300 MG CAPS Take 1 capsule by mouth daily.     multivitamin-lutein (OCUVITE-LUTEIN) CAPS Take 1 capsule by mouth daily.     OVER THE COUNTER MEDICATION Vitamin D 3 2000 iu taking in the a.m.     OVER THE COUNTER MEDICATION Flaxseed oil 1000 mg taking daily     OVER THE COUNTER MEDICATION Epax fish oil Omega 3 taking daily     Tafluprost (ZIOPTAN OP) Apply to eye.     No current facility-administered medications for this visit.    Family History  Problem Relation Age of Onset   Cancer Maternal Grandmother        breast cancer   Breast cancer Maternal Grandmother        unsure of age   Heart disease Maternal Grandfather    Stroke Brother    Alcohol abuse  Brother    Liver disease Brother    Varicose Veins Brother    Heart disease Brother    Cancer Mother    Tracheal cancer Mother    Varicose Veins Father    Heart disease Father    Prostate cancer Father    Heart attack Brother    Mental illness Brother        d. 5   Varicose Veins Paternal Grandmother    Prostate cancer Brother        possible, unsure if BPA vs prostate cancer    Review of Systems  All other systems reviewed and are negative.  Exam:   BP (!) 160/78   Pulse 92   Ht '5\' 5"'$  (1.651 m)   Wt 164 lb (74.4 kg)   LMP 04/05/2016 (Approximate)   SpO2 99%   BMI 27.29 kg/m     General appearance: alert, cooperative and appears stated age Head: normocephalic, without obvious abnormality, atraumatic Neck: no adenopathy, supple, symmetrical, trachea midline and thyroid normal to inspection and palpation Lungs: clear to auscultation bilaterally Breasts: normal appearance, no masses or tenderness, No nipple retraction or dimpling, No nipple discharge or bleeding, No axillary adenopathy Heart: regular rate and rhythm Abdomen: soft, non-tender; no masses, no organomegaly Extremities: extremities normal, atraumatic, no cyanosis or edema Skin: skin color, texture, turgor normal. No rashes or lesions Lymph nodes: cervical, supraclavicular, and axillary nodes normal. Neurologic: grossly normal  Pelvic: External genitalia:  no lesions              No abnormal inguinal nodes palpated.              Urethra:  normal appearing urethra with no masses, tenderness or lesions              Bartholins and Skenes: normal                 Vagina: normal appearing vagina with normal color and discharge, no lesions              Cervix: no lesions              Pap taken: no Bimanual Exam:  Uterus:  normal size, contour, position, consistency, mobility, non-tender              Adnexa: no mass, fullness, tenderness              Rectal exam: yes.  Confirms.              Anus:  normal sphincter  tone, no lesions  Chaperone was present for exam:  Estill Bamberg, [name].  Assessment:   Well woman visit with gynecologic exam. Postmenopausal female. Hx of mid-urethral sling.  Remote hx of ASCUS.  Follow up normal.  Hx varicose veins.  Right breast cancer.  Status post lumpectomy with sentinal node, and XRT.  On Arimidex. Negative genetic testing.  HTN.   Plan: Mammogram screening discussed. Self breast awareness reviewed. Pap and HR HPV 2023. Guidelines for Calcium, Vitamin D, regular exercise program including cardiovascular and weight bearing exercise.   Follow up annually and prn.    After visit summary provided.

## 2021-08-02 ENCOUNTER — Encounter: Payer: Self-pay | Admitting: Obstetrics and Gynecology

## 2021-08-02 ENCOUNTER — Other Ambulatory Visit: Payer: Self-pay

## 2021-08-02 ENCOUNTER — Ambulatory Visit (INDEPENDENT_AMBULATORY_CARE_PROVIDER_SITE_OTHER): Payer: BC Managed Care – PPO | Admitting: Obstetrics and Gynecology

## 2021-08-02 VITALS — BP 160/78 | HR 92 | Ht 65.0 in | Wt 164.0 lb

## 2021-08-02 DIAGNOSIS — Z01419 Encounter for gynecological examination (general) (routine) without abnormal findings: Secondary | ICD-10-CM

## 2021-08-02 NOTE — Patient Instructions (Signed)

## 2021-09-03 DIAGNOSIS — H52203 Unspecified astigmatism, bilateral: Secondary | ICD-10-CM | POA: Diagnosis not present

## 2021-09-03 DIAGNOSIS — H2513 Age-related nuclear cataract, bilateral: Secondary | ICD-10-CM | POA: Diagnosis not present

## 2021-09-03 DIAGNOSIS — H401131 Primary open-angle glaucoma, bilateral, mild stage: Secondary | ICD-10-CM | POA: Diagnosis not present

## 2021-09-06 DIAGNOSIS — I10 Essential (primary) hypertension: Secondary | ICD-10-CM | POA: Diagnosis not present

## 2021-10-31 ENCOUNTER — Other Ambulatory Visit: Payer: Self-pay | Admitting: Oncology

## 2021-10-31 DIAGNOSIS — Z9889 Other specified postprocedural states: Secondary | ICD-10-CM

## 2021-11-02 DIAGNOSIS — C50011 Malignant neoplasm of nipple and areola, right female breast: Secondary | ICD-10-CM | POA: Diagnosis not present

## 2021-11-18 ENCOUNTER — Other Ambulatory Visit: Payer: Self-pay | Admitting: Oncology

## 2022-02-14 ENCOUNTER — Other Ambulatory Visit: Payer: Self-pay | Admitting: Hematology and Oncology

## 2022-02-14 ENCOUNTER — Other Ambulatory Visit: Payer: Self-pay | Admitting: Obstetrics and Gynecology

## 2022-02-14 DIAGNOSIS — Z9889 Other specified postprocedural states: Secondary | ICD-10-CM

## 2022-02-15 ENCOUNTER — Telehealth: Payer: Self-pay | Admitting: *Deleted

## 2022-02-15 NOTE — Telephone Encounter (Signed)
Patient called received a call from the breast center that her dx mammogram scheduled on 02/18/22 may be cancelled if she didn't have a provider name on order. Patient states Dr.Magrinat,has retired. She does have new oncologist appointment scheduled on 04/30/22. Patient said she gave Dr.Silva name to breast center for results because she has not seen new oncologist yet. I informed patient that I see Dr.Silva name attached to order. Patient was relieved and appreciated the call.  ?

## 2022-02-18 ENCOUNTER — Ambulatory Visit
Admission: RE | Admit: 2022-02-18 | Discharge: 2022-02-18 | Disposition: A | Discharge status: Home / Self Care | Payer: BC Managed Care – PPO | Source: Ambulatory Visit | Attending: Oncology | Admitting: Oncology

## 2022-02-18 ENCOUNTER — Other Ambulatory Visit: Payer: Self-pay

## 2022-02-18 DIAGNOSIS — R922 Inconclusive mammogram: Secondary | ICD-10-CM | POA: Diagnosis not present

## 2022-02-18 DIAGNOSIS — Z9889 Other specified postprocedural states: Secondary | ICD-10-CM

## 2022-02-25 DIAGNOSIS — H401131 Primary open-angle glaucoma, bilateral, mild stage: Secondary | ICD-10-CM | POA: Diagnosis not present

## 2022-03-14 DIAGNOSIS — Z0000 Encounter for general adult medical examination without abnormal findings: Secondary | ICD-10-CM | POA: Diagnosis not present

## 2022-03-14 DIAGNOSIS — Z853 Personal history of malignant neoplasm of breast: Secondary | ICD-10-CM | POA: Diagnosis not present

## 2022-03-14 DIAGNOSIS — I10 Essential (primary) hypertension: Secondary | ICD-10-CM | POA: Diagnosis not present

## 2022-04-29 ENCOUNTER — Other Ambulatory Visit: Payer: BC Managed Care – PPO

## 2022-04-29 ENCOUNTER — Ambulatory Visit: Payer: BC Managed Care – PPO | Admitting: Hematology and Oncology

## 2022-04-29 ENCOUNTER — Other Ambulatory Visit: Payer: Self-pay | Admitting: *Deleted

## 2022-04-29 DIAGNOSIS — C50211 Malignant neoplasm of upper-inner quadrant of right female breast: Secondary | ICD-10-CM

## 2022-04-30 ENCOUNTER — Other Ambulatory Visit: Payer: Self-pay

## 2022-04-30 ENCOUNTER — Inpatient Hospital Stay (HOSPITAL_BASED_OUTPATIENT_CLINIC_OR_DEPARTMENT_OTHER): Payer: BC Managed Care – PPO | Admitting: Hematology and Oncology

## 2022-04-30 ENCOUNTER — Inpatient Hospital Stay: Payer: BC Managed Care – PPO | Attending: Hematology and Oncology

## 2022-04-30 ENCOUNTER — Encounter: Payer: Self-pay | Admitting: Hematology and Oncology

## 2022-04-30 VITALS — BP 153/85 | HR 80 | Temp 97.9°F | Resp 18 | Ht 65.0 in | Wt 169.7 lb

## 2022-04-30 DIAGNOSIS — Z79811 Long term (current) use of aromatase inhibitors: Secondary | ICD-10-CM | POA: Insufficient documentation

## 2022-04-30 DIAGNOSIS — F419 Anxiety disorder, unspecified: Secondary | ICD-10-CM | POA: Diagnosis not present

## 2022-04-30 DIAGNOSIS — Z170 Estrogen receptor positive status [ER+]: Secondary | ICD-10-CM

## 2022-04-30 DIAGNOSIS — C50211 Malignant neoplasm of upper-inner quadrant of right female breast: Secondary | ICD-10-CM

## 2022-04-30 DIAGNOSIS — Z8042 Family history of malignant neoplasm of prostate: Secondary | ICD-10-CM | POA: Diagnosis not present

## 2022-04-30 DIAGNOSIS — Z853 Personal history of malignant neoplasm of breast: Secondary | ICD-10-CM | POA: Diagnosis not present

## 2022-04-30 DIAGNOSIS — Z803 Family history of malignant neoplasm of breast: Secondary | ICD-10-CM | POA: Diagnosis not present

## 2022-04-30 DIAGNOSIS — I10 Essential (primary) hypertension: Secondary | ICD-10-CM | POA: Insufficient documentation

## 2022-04-30 LAB — CMP (CANCER CENTER ONLY)
ALT: 20 U/L (ref 0–44)
AST: 22 U/L (ref 15–41)
Albumin: 4.5 g/dL (ref 3.5–5.0)
Alkaline Phosphatase: 81 U/L (ref 38–126)
Anion gap: 6 (ref 5–15)
BUN: 18 mg/dL (ref 8–23)
CO2: 29 mmol/L (ref 22–32)
Calcium: 9.5 mg/dL (ref 8.9–10.3)
Chloride: 105 mmol/L (ref 98–111)
Creatinine: 0.77 mg/dL (ref 0.44–1.00)
GFR, Estimated: >60 mL/min (ref >60)
Glucose, Bld: 99 mg/dL (ref 70–99)
Potassium: 4 mmol/L (ref 3.5–5.1)
Sodium: 140 mmol/L (ref 135–145)
Total Bilirubin: 0.6 mg/dL (ref 0.3–1.2)
Total Protein: 7.4 g/dL (ref 6.5–8.1)

## 2022-04-30 LAB — CBC WITH DIFFERENTIAL (CANCER CENTER ONLY)
Abs Immature Granulocytes: 0.02 10*3/uL (ref 0.00–0.07)
Basophils Absolute: 0 10*3/uL (ref 0.0–0.1)
Basophils Relative: 1 %
Eosinophils Absolute: 0.1 10*3/uL (ref 0.0–0.5)
Eosinophils Relative: 1 %
HCT: 46 % (ref 36.0–46.0)
Hemoglobin: 15.5 g/dL — ABNORMAL HIGH (ref 12.0–15.0)
Immature Granulocytes: 0 %
Lymphocytes Relative: 19 %
Lymphs Abs: 1.3 10*3/uL (ref 0.7–4.0)
MCH: 30.2 pg (ref 26.0–34.0)
MCHC: 33.7 g/dL (ref 30.0–36.0)
MCV: 89.5 fL (ref 80.0–100.0)
Monocytes Absolute: 0.4 10*3/uL (ref 0.1–1.0)
Monocytes Relative: 6 %
Neutro Abs: 4.9 10*3/uL (ref 1.7–7.7)
Neutrophils Relative %: 73 %
Platelet Count: 240 10*3/uL (ref 150–400)
RBC: 5.14 MIL/uL — ABNORMAL HIGH (ref 3.87–5.11)
RDW: 11.9 % (ref 11.5–15.5)
WBC Count: 6.7 10*3/uL (ref 4.0–10.5)
nRBC: 0 % (ref 0.0–0.2)

## 2022-04-30 NOTE — Progress Notes (Signed)
?[place]  ?[phone] [phone]  ? ? ?ID: Rhonda [name] DOB: 04/01/1961  MR#: [id number]  YTW#:[id number] ? ?Patient Care Team: ?Lavone Orn, MD as PCP - General (Internal Medicine) ?Mauro Kaufmann, RN as Oncology Nurse Navigator ?Rockwell Germany, RN as Oncology Nurse Navigator ?Magrinat, Virgie Dad, MD (Inactive) as Consulting Physician (Oncology) ?Gery Pray, MD as Consulting Physician (Radiation Oncology) ?Nunzio Cobbs, MD as Consulting Physician (Obstetrics and Gynecology) ?Macario Carls, MD as Referring Physician (Dermatology) ?Katy Apo, MD as Consulting Physician (Ophthalmology) ?Linus Mako, MD as Consulting Physician (Vascular Surgery) ?Coralie Keens, MD as Consulting Physician (General Surgery) ?OTHER MD: ? ? ?CHIEF COMPLAINT: Estrogen receptor positive breast cancer ? ?CURRENT TREATMENT: anastrozole ? ? ?INTERVAL HISTORY: ? ?Rhonda [name] returns today for follow-up of her estrogen receptor positive breast cancer. ?She continues on anastrozole.  She is tolerating this well, with no significant side effects that she is aware of.  She states she is occasionally tired, has been having some blood pressure issues, cholesterol issues but she follows a very healthy diet, exercises regularly.  She walks 4 miles 3 times a week and hikes regularly. She is very nervous about coming to the follow-up.  She complains of whitecoat hypertension.  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   ?She also had a few other issues going on but she overall feels well.  No major changes in her breast.  Last mammogram without any evidence of malignancy.  Rest of the pertinent 10 point ROS reviewed and negative ? ?REVIEW OF SYSTEMS: ? ? COVID 19 VACCINATION STATUS:  Fully vaccinated [employer]), with booster 11/2020 ? ? ?HISTORY OF CURRENT ILLNESS: ?From the original intake note: ? ?Rhonda [name] had  routine screening mammography on 02/12/2019 showing a possible abnormality in the right breast. She underwent unilateral right diagnostic mammography with tomography and right breast ultrasonography at [facility] on 02/17/2019 showing: Breast Density Category C. a persistent irregular mass measuring approximately 0.8 cm. Ultrasound of the right breast at 12 o'clock, 5 cm from the nipple demonstrates an irregular hypoechoic mass with indistinct margins measuring 0.7 x 0.5 x 0.7 cm. No blood flow seen within the mass on color Doppler imaging. Ultrasound of the right axilla demonstrates multiple normal-appearing nodes. ? ?Accordingly on 02/22/2019 she proceeded to biopsy of the right breast area in question. 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 Prognostic indicators significant for: estrogen receptor, 100% positive and progesterone receptor, 100% positive, both with strong staining intensity. Proliferation marker Ki67 at 1%. HER2 equivocal (2+) by immunohistochemistry but negative by fluorescent in situ hybridization with a signals ratio 1.32 and number per cell 1.85.  ? ?The patient's subsequent history is as detailed below. ? ? ?PAST MEDICAL HISTORY: ?Past Medical History:  ?Diagnosis Date  ? Allergy   ? seasonal  ? Anxiety   ? Cancer ([place]) 02/2019  ? right breast IDC  ? Family history of breast cancer   ? Family history of prostate cancer   ? Fibroid   ? Glaucoma   ? Leg pain, bilateral   ? Lung nodules   ? benign  ? Other and unspecified hyperlipidemia 04/05/2013  ? Peripheral vascular disease ([place])   ? chronic venous insufficiency bilaterally  ? Personal history of radiation therapy   ? PONV (postoperative nausea and vomiting)   ? Varicose  veins of both lower extremities   ? ? ?PAST SURGICAL HISTORY: ?Past Surgical History:  ?Procedure Laterality Date  ? BLADDER SUSPENSION  2010  ? Dr.Silva  ? BREAST CYST ASPIRATION Right  09/08/2012  ? BREAST LUMPECTOMY Right 03/19/2019  ? BREAST LUMPECTOMY WITH RADIOACTIVE SEED AND SENTINEL LYMPH NODE BIOPSY Right 03/22/2019  ? Procedure: RIGHT BREAST LUMPECTOMY WITH RADIOACTIVE SEED AND RIGHT AXILLARY SENTINEL LYMPH NODE BIOPSY;  Surgeon: Alphonsa Overall, MD;  Location: [facility];  Service: General;  Laterality: Right;  ? INSERTION OF MESH N/A 11/01/2016  ? Procedure: INSERTION OF MESH;  Surgeon: Jackolyn Confer, MD;  Location: [facility];  Service: General;  Laterality: N/A;  ? RHINOPLASTY    ? TONSILLECTOMY    ? varicose veins    ? [place]  ? VENTRAL HERNIA REPAIR N/A 11/01/2016  ? Procedure: VENTRAL HERNIA REPAIR WITH MESH;  Surgeon: Jackolyn Confer, MD;  Location: [facility];  Service: General;  Laterality: N/A;  ? ? ?FAMILY HISTORY: ?Family History  ?Problem Relation Age of Onset  ? Cancer Maternal Grandmother   ?     breast cancer  ? Breast cancer Maternal Grandmother   ?     unsure of age  ? Heart disease Maternal Grandfather   ? Stroke Brother   ? Alcohol abuse Brother   ? Liver disease Brother   ? Varicose Veins Brother   ? Heart disease Brother   ? Cancer Mother   ? Tracheal cancer Mother   ? Varicose Veins Father   ? Heart disease Father   ? Prostate cancer Father   ? Heart attack Brother   ? Mental illness Brother   ?     d. 64  ? Varicose Veins Paternal Grandmother   ? Prostate cancer Brother   ?     possible, unsure if BPA vs prostate cancer  ?Belkis's father died from congestive heart failure at age 8. Patients' mother died from tracheal carcinoma at age 68. The patient has 4 brothers and 1 sister. Patient denies anyone in her family having ovarian, or pancreatic cancer. Nioka's mother was diagnosed with tracheal carcinoma and acoustic neuroma at age 18. Hadasah's father was diagnosed with prostate cancer in his 85's. Leinani's brother was diagnosed with prostate cancer in his mid 17's. Angles's maternal grandmother was diagnosed with breast cancer in her late  22's.  ? ? ?GYNECOLOGIC HISTORY:  ?Patient's last menstrual period was 04/05/2016 (approximate). ?Menarche: 61 years old ?Age at first live birth: 62 years old ?GX P: 2 ?LMP: 03/2016, age 48 ?Contraceptive: yes, less than 1 year ?HRT: no  ?Hysterectomy?: no ?BSO?: no ? ? 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 Her husband, Richardson Landry, is a retired Community education officer for [employer]. They have two children, Remo Lipps and Elroy. Remo Lipps is 43, lives in [place], [place], and is a music professor. Cherly Beach is 16, lives in [place], [place], and recently graduated with a degree in business.  The patient attends our [employer].  ? ?ADVANCED DIRECTIVES: In the absence of any documentation, Estephany's spouse, Richardson Landry, is her healthcare power of attorney.    ? ? ?HEALTH MAINTENANCE: ?Social History  ? ?Tobacco Use  ? Smoking status: Never  ? Smokeless tobacco: Never  ?Vaping Use  ? Vaping Use: Never used  ?Substance Use Topics  ? Alcohol use: Yes  ?  Alcohol/week: 1.0 standard drink  ?  Types: 1 Glasses of  wine per week  ? Drug use: No  ? ? Colonoscopy: yes, 05/2017, normal; Dr. Darrel Hoover  ? PAP: 03/2017 ? Bone density: August 11, 2008, showed a T score of -2.1 ?Mammography: 02/12/2019 ? ?Allergies  ?Allergen Reactions  ? Diflucan [Fluconazole] Anaphylaxis  ?  Tongue swelling  ? ? ?Current Outpatient Medications  ?Medication Sig Dispense Refill  ? amLODipine (NORVASC) 5 MG tablet Take 5 mg by mouth daily.    ? anastrozole (ARIMIDEX) 1 MG tablet TAKE 1 TABLET BY MOUTH EVERY DAY 90 tablet 2  ? Ascorbic Acid (VITAMIN C) 1000 MG tablet Take 1,000 mg by mouth daily.    ? cholecalciferol (VITAMIN D3) 25 MCG (1000 UNIT) tablet Take 2 tablets (2,000 Units total) by mouth daily.    ? lactobacillus acidophilus (BACID) TABS tablet Take 1 tablet by mouth 3 (three) times a week. Reported on 12/12/2015    ? LORazepam (ATIVAN) 0.5 MG tablet Take 0.5 mg by mouth at bedtime.    ? Magnesium 300 MG CAPS  Take 1 capsule by mouth daily.    ? multivitamin-lutein (OCUVITE-LUTEIN) CAPS Take 1 capsule by mouth daily.    ? OVER THE COUNTER MEDICATION Vitamin D 3 2000 iu taking in the a.m.    ? OVER THE COUNTER MEDICA

## 2022-05-07 ENCOUNTER — Encounter: Payer: Self-pay | Admitting: Hematology and Oncology

## 2022-05-15 DIAGNOSIS — E785 Hyperlipidemia, unspecified: Secondary | ICD-10-CM | POA: Diagnosis not present

## 2022-08-06 NOTE — Progress Notes (Unsigned)
61 y.o. G27P2012 Married Caucasian female here for annual exam.    Taking with Arimidex.  Feels tired on this.  Seeing medical oncology for her history of breast cancer.   She declines bone density and does not want to take any medication for improving bone density even if she has osteoporosis per patient.  She will consider this after she finishes the Arimidex.  Has vaginal dryness.  Using lubricant with aloe vera.  No leakage of urine with valsalva maneuvers.  Traveled to [place].   PCP: Lavone Orn, MD    Patient's last menstrual period was 04/05/2016 (approximate).           Sexually active: Yes.   occ The current method of family planning is post menopausal status.    Exercising: Yes.     Walks 12 miles/week Smoker:  no  Health Maintenance: Pap:   03-12-17 Neg:Neg HR HPV, 02-13-15 Neg:Neg HR HPV History of abnormal Pap:  2014 Hx of ASCUS MMG:  02-18-22 Diag.Bil/Neg/BiRads2 Colonoscopy:   2018 normal;next 10 years BMD:  08-11-08  Result :Normal.  Declines.  TDaP:  2018 Gardasil:   no HIV:2017 NR Hep C:2017 Neg Screening Labs:  PCP and oncology   reports that she has never smoked. She has never used smokeless tobacco. She reports current alcohol use of about 1.0 standard drink of alcohol per week. She reports that she does not use drugs.  Past Medical History:  Diagnosis Date   Allergy    seasonal   Anxiety    Cancer ([place]) 02/2019   right breast IDC   Family history of breast cancer    Family history of prostate cancer    Fibroid    Glaucoma    Leg pain, bilateral    Lung nodules    benign   Other and unspecified hyperlipidemia 04/05/2013   Peripheral vascular disease ([place])    chronic venous insufficiency bilaterally   Personal history of radiation therapy    PONV (postoperative nausea and vomiting)    Varicose veins of both lower extremities     Past Surgical History:  Procedure Laterality Date   BLADDER SUSPENSION  2010   Dr.Silva   BREAST CYST  ASPIRATION Right 09/08/2012   BREAST LUMPECTOMY Right 03/19/2019   BREAST LUMPECTOMY WITH RADIOACTIVE SEED AND SENTINEL LYMPH NODE BIOPSY Right 03/22/2019   Procedure: RIGHT BREAST LUMPECTOMY WITH RADIOACTIVE SEED AND RIGHT AXILLARY SENTINEL LYMPH NODE BIOPSY;  Surgeon: Alphonsa Overall, MD;  Location: [facility];  Service: General;  Laterality: Right;   INSERTION OF MESH N/A 11/01/2016   Procedure: INSERTION OF MESH;  Surgeon: Jackolyn Confer, MD;  Location: [facility];  Service: General;  Laterality: N/A;   RHINOPLASTY     TONSILLECTOMY     varicose veins     VEIN LIGATION AND STRIPPING  1995   VENTRAL HERNIA REPAIR N/A 11/01/2016   Procedure: VENTRAL HERNIA REPAIR WITH MESH;  Surgeon: Jackolyn Confer, MD;  Location: [facility];  Service: General;  Laterality: N/A;    Current Outpatient Medications  Medication Sig Dispense Refill   amLODipine (NORVASC) 5 MG tablet Take 5 mg by mouth daily.     anastrozole (ARIMIDEX) 1 MG tablet TAKE 1 TABLET BY MOUTH EVERY DAY 90 tablet 2   Ascorbic Acid (VITAMIN C) 1000 MG tablet Take 1,000 mg by mouth daily.     lactobacillus acidophilus (BACID) TABS tablet Take 1 tablet by mouth 3 (three) times a week. Reported on 12/12/2015  LORazepam (ATIVAN) 0.5 MG tablet Take 0.5 mg by mouth at bedtime.     Magnesium 300 MG CAPS Take 1 capsule by mouth daily.     multivitamin-lutein (OCUVITE-LUTEIN) CAPS Take 1 capsule by mouth daily.     OVER THE COUNTER MEDICATION Vitamin D 3 2000 iu taking in the a.m.     OVER THE COUNTER MEDICATION Flaxseed oil 1000 mg taking daily     Tafluprost (ZIOPTAN OP) Apply to eye.     No current facility-administered medications for this visit.    Family History  Problem Relation Age of Onset   Cancer Maternal Grandmother        breast cancer   Breast cancer Maternal Grandmother        unsure of age   Heart disease Maternal Grandfather    Stroke Brother    Alcohol abuse Brother    Liver disease Brother    Varicose  Veins Brother    Heart disease Brother    Cancer Mother    Tracheal cancer Mother    Varicose Veins Father    Heart disease Father    Prostate cancer Father    Heart attack Brother    Mental illness Brother        d. 66   Varicose Veins Paternal Grandmother    Prostate cancer Brother        possible, unsure if BPA vs prostate cancer    Review of Systems  All other systems reviewed and are negative.   Exam:   BP 138/80   Pulse 82   Ht '5\' 5"'$  (1.651 m)   Wt 168 lb (76.2 kg)   LMP 04/05/2016 (Approximate)   SpO2 98%   BMI 27.96 kg/m     General appearance: alert, cooperative and appears stated age Head: normocephalic, without obvious abnormality, atraumatic Neck: no adenopathy, supple, symmetrical, trachea midline and thyroid normal to inspection and palpation Lungs: clear to auscultation bilaterally Breasts: scars on right breast and normal appearance of left breast. No masses or tenderness, No nipple retraction or dimpling, No nipple discharge or bleeding, No axillary adenopathy bilaterally.  Heart: regular rate and rhythm Abdomen: soft, non-tender; no masses, no organomegaly Extremities: extremities normal, atraumatic, no cyanosis or edema Skin: skin color, texture, turgor normal. No rashes or lesions Lymph nodes: cervical, supraclavicular, and axillary nodes normal. Neurologic: grossly normal  Pelvic: External genitalia:  no lesions              No abnormal inguinal nodes palpated.              Urethra:  normal appearing urethra with no masses, tenderness or lesions              Bartholins and Skenes: normal                 Vagina: normal appearing vagina with normal color and discharge, no lesions              Cervix: no lesions              Pap taken: yes Bimanual Exam:  Uterus:  normal size, contour, position, consistency, mobility, non-tender              Adnexa: no mass, fullness, tenderness              Rectal exam: yes.  Confirms.              Anus:  normal  sphincter tone, no lesions  Chaperone was present for exam:  Estill Bamberg, [name]  Assessment:   Well woman visit with gynecologic exam. Postmenopausal female. Hx of mid-urethral sling.  Remote hx of ASCUS.  Follow up normal.  Hx varicose veins.  Right breast cancer.  Status post lumpectomy with sentinal node, and XRT.  On Arimidex. Negative genetic testing.  HTN.   Plan: Mammogram screening discussed. Self breast awareness reviewed. Pap and HR HPV as above. Guidelines for Calcium, Vitamin D, regular exercise program including cardiovascular and weight bearing exercise. Patient declines bone density testing.  Labs with PCP and oncology. Follow up annually and prn.   After visit summary provided.

## 2022-08-08 ENCOUNTER — Other Ambulatory Visit (HOSPITAL_COMMUNITY)
Admission: RE | Admit: 2022-08-08 | Discharge: 2022-08-08 | Disposition: A | Discharge status: Home / Self Care | Payer: BC Managed Care – PPO | Source: Ambulatory Visit | Attending: Obstetrics and Gynecology | Admitting: Obstetrics and Gynecology

## 2022-08-08 ENCOUNTER — Encounter: Payer: Self-pay | Admitting: Obstetrics and Gynecology

## 2022-08-08 ENCOUNTER — Ambulatory Visit (INDEPENDENT_AMBULATORY_CARE_PROVIDER_SITE_OTHER): Payer: BC Managed Care – PPO | Admitting: Obstetrics and Gynecology

## 2022-08-08 VITALS — BP 138/80 | HR 82 | Ht 65.0 in | Wt 168.0 lb

## 2022-08-08 DIAGNOSIS — Z01419 Encounter for gynecological examination (general) (routine) without abnormal findings: Secondary | ICD-10-CM | POA: Diagnosis not present

## 2022-08-08 DIAGNOSIS — Z124 Encounter for screening for malignant neoplasm of cervix: Secondary | ICD-10-CM | POA: Diagnosis not present

## 2022-08-08 NOTE — Patient Instructions (Signed)
Calcium Content in Foods Calcium is the most abundant mineral in the body. Most of the body's calcium supply is stored in bones and teeth. Calcium helps many parts of the body function normally, including: Blood and blood vessels. Nerves. Hormones. Muscles. Bones and teeth. When your calcium stores are low, you may be at risk for low bone mass, bone loss, and broken bones (fractures). When you get enough calcium, it helps to support strong bones and teeth throughout your life. Calcium is especially important for: Children during growth spurts. Girls during adolescence. Women who are pregnant or breastfeeding. Women after their menstrual cycle stops (postmenopause). Women whose menstrual cycle has stopped due to anorexia nervosa or regular intense exercise. People who cannot eat or digest dairy products. Vegans. Recommended daily amounts of calcium: Women (ages 73 to 44): 1,000 mg per day. Women (ages 44 and older): 1,200 mg per day. Men (ages 35 to 74): 1,000 mg per day. Men (ages 87 and older): 1,200 mg per day. Women (ages 29 to 79): 1,300 mg per day. Men (ages 4 to 11): 1,300 mg per day. General information Eat foods that are high in calcium. Try to get most of your calcium from food. Some people may benefit from taking calcium supplements. Check with your health care provider or diet and nutrition specialist (dietitian) before starting any calcium supplements. Calcium supplements may interact with certain medicines. Too much calcium may cause other health problems, such as constipation and kidney stones. For the body to absorb calcium, it needs vitamin D. Sources of vitamin D include: Skin exposure to direct sunlight. Foods, such as egg yolks, liver, mushrooms, saltwater fish, and fortified milk. Vitamin D supplements. Check with your health care provider or dietitian before starting any vitamin D supplements. What foods are high in calcium?  Foods that are high in calcium contain  more than 100 milligrams per serving. Fruits Fortified orange juice or other fruit juice, 300 mg per 8 oz serving. Vegetables Collard greens, 360 mg per 8 oz serving. Kale, 100 mg per 8 oz serving. Bok choy, 160 mg per 8 oz serving. Grains Fortified ready-to-eat cereals, 100 to 1,000 mg per 8 oz serving. Fortified frozen waffles, 200 mg in 2 waffles. Oatmeal, 140 mg in 1 cup. Meats and other proteins Sardines, canned with bones, 325 mg per 3 oz serving. Salmon, canned with bones, 180 mg per 3 oz serving. Canned shrimp, 125 mg per 3 oz serving. Baked beans, 160 mg per 4 oz serving. Tofu, firm, made with calcium sulfate, 253 mg per 4 oz serving. Dairy Yogurt, plain, low-fat, 310 mg per 6 oz serving. Nonfat milk, 300 mg per 8 oz serving. American cheese, 195 mg per 1 oz serving. Cheddar cheese, 205 mg per 1 oz serving. Cottage cheese 2%, 105 mg per 4 oz serving. Fortified soy, rice, or almond milk, 300 mg per 8 oz serving. Mozzarella, part skim, 210 mg per 1 oz serving. The items listed above may not be a complete list of foods high in calcium. Actual amounts of calcium may be different depending on processing. Contact a dietitian for more information. What foods are lower in calcium? Foods that are lower in calcium contain 50 mg or less per serving. Fruits Apple, about 6 mg. Banana, about 12 mg. Vegetables Lettuce, 19 mg per 2 oz serving. Tomato, about 11 mg. Grains Rice, 4 mg per 6 oz serving. Boiled potatoes, 14 mg per 8 oz serving. White bread, 6 mg per slice. Meats and other proteins  Egg, 27 mg per 2 oz serving. Red meat, 7 mg per 4 oz serving. Chicken, 17 mg per 4 oz serving. Fish, cod, or trout, 20 mg per 4 oz serving. Dairy Cream cheese, regular, 14 mg per 1 Tbsp serving. Brie cheese, 50 mg per 1 oz serving. Parmesan cheese, 70 mg per 1 Tbsp serving. The items listed above may not be a complete list of foods lower in calcium. Actual amounts of calcium may be  different depending on processing. Contact a dietitian for more information. Summary Calcium is an important mineral in the body because it affects many functions. Getting enough calcium helps support strong bones and teeth throughout your life. Try to get most of your calcium from food. Calcium supplements may interact with certain medicines. Check with your health care provider or dietitian before starting any calcium supplements. This information is not intended to replace advice given to you by your health care provider. Make sure you discuss any questions you have with your health care provider. Document Revised: 03/29/2020 Document Reviewed: 03/29/2020 Elsevier Patient Education  [place] AND DIET:  We recommended that you start or continue a regular exercise program for good health. Regular exercise means any activity that makes your heart beat faster and makes you sweat.  We recommend exercising at least 30 minutes per day at least 3 days a week, preferably 4 or 5.  We also recommend a diet low in fat and sugar.  Inactivity, poor dietary choices and obesity can cause diabetes, heart attack, stroke, and kidney damage, among others.    ALCOHOL AND SMOKING:  Women should limit their alcohol intake to no more than 7 drinks/beers/glasses of wine (combined, not each!) per week. Moderation of alcohol intake to this level decreases your risk of breast cancer and liver damage. And of course, no recreational drugs are part of a healthy lifestyle.  And absolutely no smoking or even second hand smoke. Most people know smoking can cause heart and lung diseases, but did you know it also contributes to weakening of your bones? Aging of your skin?  Yellowing of your teeth and nails?  CALCIUM AND VITAMIN D:  Adequate intake of calcium and Vitamin D are recommended.  The recommendations for exact amounts of these supplements seem to change often, but generally speaking 600 mg of calcium  (either carbonate or citrate) and 800 units of Vitamin D per day seems prudent. Certain women may benefit from higher intake of Vitamin D.  If you are among these women, your doctor will have told you during your visit.    PAP SMEARS:  Pap smears, to check for cervical cancer or precancers,  have traditionally been done yearly, although recent scientific advances have shown that most women can have pap smears less often.  However, every woman still should have a physical exam from her gynecologist every year. It will include a breast check, inspection of the vulva and vagina to check for abnormal growths or skin changes, a visual exam of the cervix, and then an exam to evaluate the size and shape of the uterus and ovaries.  And after 63 years of age, a rectal exam is indicated to check for rectal cancers. We will also provide age appropriate advice regarding health maintenance, like when you should have certain vaccines, screening for sexually transmitted diseases, bone density testing, colonoscopy, mammograms, etc.   MAMMOGRAMS:  All women over 51 years old should have a yearly mammogram. Many facilities now offer  a "3D" mammogram, which may cost around $50 extra out of pocket. If possible,  we recommend you accept the option to have the 3D mammogram performed.  It both reduces the number of women who will be called back for extra views which then turn out to be normal, and it is better than the routine mammogram at detecting truly abnormal areas.    COLONOSCOPY:  Colonoscopy to screen for colon cancer is recommended for all women at age 81.  We know, you hate the idea of the prep.  We agree, BUT, having colon cancer and not knowing it is worse!!  Colon cancer so often starts as a polyp that can be seen and removed at colonscopy, which can quite literally save your life!  And if your first colonoscopy is normal and you have no family history of colon cancer, most women don't have to have it again for 10  years.  Once every ten years, you can do something that may end up saving your life, right?  We will be happy to help you get it scheduled when you are ready.  Be sure to check your insurance coverage so you understand how much it will cost.  It may be covered as a preventative service at no cost, but you should check your particular policy.

## 2022-08-13 LAB — CYTOLOGY - PAP
Comment: NEGATIVE
Diagnosis: NEGATIVE
High risk HPV: NEGATIVE

## 2022-08-20 ENCOUNTER — Other Ambulatory Visit: Payer: Self-pay | Admitting: *Deleted

## 2022-08-20 MED ORDER — ANASTROZOLE 1 MG PO TABS: 1.0000 mg | ORAL_TABLET | Freq: Every day | ORAL | 2 refills | Status: DC

## 2022-09-06 DIAGNOSIS — H401131 Primary open-angle glaucoma, bilateral, mild stage: Secondary | ICD-10-CM | POA: Diagnosis not present

## 2022-11-12 DIAGNOSIS — Z853 Personal history of malignant neoplasm of breast: Secondary | ICD-10-CM | POA: Diagnosis not present

## 2022-12-19 ENCOUNTER — Other Ambulatory Visit: Payer: Self-pay | Admitting: Hematology and Oncology

## 2022-12-19 DIAGNOSIS — Z853 Personal history of malignant neoplasm of breast: Secondary | ICD-10-CM

## 2023-02-10 ENCOUNTER — Telehealth: Payer: Self-pay | Admitting: Hematology and Oncology

## 2023-02-10 NOTE — Telephone Encounter (Signed)
Reached out to patient to reschedule visit with [facility] ; provider out office, left voicemail for patient to call back to confirm.

## 2023-02-21 ENCOUNTER — Ambulatory Visit
Admission: RE | Admit: 2023-02-21 | Discharge: 2023-02-21 | Disposition: A | Discharge status: Home / Self Care | Payer: BC Managed Care – PPO | Source: Ambulatory Visit | Attending: Hematology and Oncology | Admitting: Hematology and Oncology

## 2023-02-21 DIAGNOSIS — Z853 Personal history of malignant neoplasm of breast: Secondary | ICD-10-CM

## 2023-03-17 DIAGNOSIS — H401131 Primary open-angle glaucoma, bilateral, mild stage: Secondary | ICD-10-CM | POA: Diagnosis not present

## 2023-03-17 DIAGNOSIS — H2513 Age-related nuclear cataract, bilateral: Secondary | ICD-10-CM | POA: Diagnosis not present

## 2023-04-10 ENCOUNTER — Telehealth: Payer: Self-pay | Admitting: Hematology and Oncology

## 2023-04-10 NOTE — Telephone Encounter (Signed)
Patient left voicemail regarding appointments coming up, requested to add lab. Patient aware of date and times of appointment.

## 2023-05-01 ENCOUNTER — Ambulatory Visit: Payer: BC Managed Care – PPO | Admitting: Hematology and Oncology

## 2023-05-07 DIAGNOSIS — E7800 Pure hypercholesterolemia, unspecified: Secondary | ICD-10-CM | POA: Diagnosis not present

## 2023-05-07 DIAGNOSIS — E559 Vitamin D deficiency, unspecified: Secondary | ICD-10-CM | POA: Diagnosis not present

## 2023-05-07 DIAGNOSIS — E041 Nontoxic single thyroid nodule: Secondary | ICD-10-CM | POA: Diagnosis not present

## 2023-05-07 DIAGNOSIS — I10 Essential (primary) hypertension: Secondary | ICD-10-CM | POA: Diagnosis not present

## 2023-05-07 DIAGNOSIS — Z0000 Encounter for general adult medical examination without abnormal findings: Secondary | ICD-10-CM | POA: Diagnosis not present

## 2023-05-08 ENCOUNTER — Inpatient Hospital Stay: Payer: BC Managed Care – PPO | Attending: Hematology and Oncology | Admitting: Hematology and Oncology

## 2023-05-08 ENCOUNTER — Other Ambulatory Visit: Payer: Self-pay | Admitting: *Deleted

## 2023-05-08 ENCOUNTER — Inpatient Hospital Stay: Payer: BC Managed Care – PPO

## 2023-05-08 ENCOUNTER — Other Ambulatory Visit: Payer: Self-pay | Admitting: Hematology and Oncology

## 2023-05-08 VITALS — BP 145/75 | HR 84 | Temp 99.0°F | Resp 16 | Ht 65.0 in | Wt 167.5 lb

## 2023-05-08 DIAGNOSIS — Z79811 Long term (current) use of aromatase inhibitors: Secondary | ICD-10-CM | POA: Diagnosis not present

## 2023-05-08 DIAGNOSIS — Z801 Family history of malignant neoplasm of trachea, bronchus and lung: Secondary | ICD-10-CM | POA: Diagnosis not present

## 2023-05-08 DIAGNOSIS — Z803 Family history of malignant neoplasm of breast: Secondary | ICD-10-CM | POA: Diagnosis not present

## 2023-05-08 DIAGNOSIS — E785 Hyperlipidemia, unspecified: Secondary | ICD-10-CM | POA: Insufficient documentation

## 2023-05-08 DIAGNOSIS — Z9221 Personal history of antineoplastic chemotherapy: Secondary | ICD-10-CM | POA: Diagnosis not present

## 2023-05-08 DIAGNOSIS — I739 Peripheral vascular disease, unspecified: Secondary | ICD-10-CM | POA: Diagnosis not present

## 2023-05-08 DIAGNOSIS — E041 Nontoxic single thyroid nodule: Secondary | ICD-10-CM | POA: Diagnosis not present

## 2023-05-08 DIAGNOSIS — Z170 Estrogen receptor positive status [ER+]: Secondary | ICD-10-CM

## 2023-05-08 DIAGNOSIS — Z79899 Other long term (current) drug therapy: Secondary | ICD-10-CM | POA: Insufficient documentation

## 2023-05-08 DIAGNOSIS — C50211 Malignant neoplasm of upper-inner quadrant of right female breast: Secondary | ICD-10-CM | POA: Insufficient documentation

## 2023-05-08 DIAGNOSIS — Z923 Personal history of irradiation: Secondary | ICD-10-CM | POA: Insufficient documentation

## 2023-05-08 DIAGNOSIS — Z8042 Family history of malignant neoplasm of prostate: Secondary | ICD-10-CM | POA: Diagnosis not present

## 2023-05-08 LAB — CMP (CANCER CENTER ONLY)
ALT: 17 U/L (ref 0–44)
AST: 20 U/L (ref 15–41)
Albumin: 4.5 g/dL (ref 3.5–5.0)
Alkaline Phosphatase: 70 U/L (ref 38–126)
Anion gap: 5 (ref 5–15)
BUN: 19 mg/dL (ref 8–23)
CO2: 30 mmol/L (ref 22–32)
Calcium: 9.4 mg/dL (ref 8.9–10.3)
Chloride: 105 mmol/L (ref 98–111)
Creatinine: 0.77 mg/dL (ref 0.44–1.00)
GFR, Estimated: >60 mL/min (ref >60)
Glucose, Bld: 94 mg/dL (ref 70–99)
Potassium: 4.7 mmol/L (ref 3.5–5.1)
Sodium: 140 mmol/L (ref 135–145)
Total Bilirubin: 0.9 mg/dL (ref 0.3–1.2)
Total Protein: 7.3 g/dL (ref 6.5–8.1)

## 2023-05-08 LAB — CBC WITH DIFFERENTIAL (CANCER CENTER ONLY)
Abs Immature Granulocytes: 0.02 10*3/uL (ref 0.00–0.07)
Basophils Absolute: 0 10*3/uL (ref 0.0–0.1)
Basophils Relative: 1 %
Eosinophils Absolute: 0.1 10*3/uL (ref 0.0–0.5)
Eosinophils Relative: 2 %
HCT: 46.2 % — ABNORMAL HIGH (ref 36.0–46.0)
Hemoglobin: 15.6 g/dL — ABNORMAL HIGH (ref 12.0–15.0)
Immature Granulocytes: 0 %
Lymphocytes Relative: 25 %
Lymphs Abs: 1.3 10*3/uL (ref 0.7–4.0)
MCH: 30.2 pg (ref 26.0–34.0)
MCHC: 33.8 g/dL (ref 30.0–36.0)
MCV: 89.5 fL (ref 80.0–100.0)
Monocytes Absolute: 0.4 10*3/uL (ref 0.1–1.0)
Monocytes Relative: 7 %
Neutro Abs: 3.5 10*3/uL (ref 1.7–7.7)
Neutrophils Relative %: 65 %
Platelet Count: 215 10*3/uL (ref 150–400)
RBC: 5.16 MIL/uL — ABNORMAL HIGH (ref 3.87–5.11)
RDW: 11.9 % (ref 11.5–15.5)
WBC Count: 5.4 10*3/uL (ref 4.0–10.5)
nRBC: 0 % (ref 0.0–0.2)

## 2023-05-08 NOTE — Progress Notes (Signed)
[facility]  [phone] [phone]    ID: Rhonda [name] DOB: 31-Aug-1961  MR#: [id number]  JYN#:[id number]  Patient Care Team: Kirby Funk, MD as PCP - General (Internal Medicine) Pershing Proud, RN as Oncology Nurse Navigator Donnelly Angelica, RN as Oncology Nurse Navigator Magrinat, Valentino Hue, MD (Inactive) as Consulting Physician (Oncology) Antony Blackbird, MD as Consulting Physician (Radiation Oncology) Patton Salles, MD as Consulting Physician (Obstetrics and Gynecology) Wynona Canes, MD as Referring Physician (Dermatology) Antony Contras, MD as Consulting Physician (Ophthalmology) Consuela Mimes, MD as Consulting Physician (Vascular Surgery) Abigail Miyamoto, MD as Consulting Physician (General Surgery)  CHIEF COMPLAINT: Estrogen receptor positive breast cancer  CURRENT TREATMENT: anastrozole  INTERVAL HISTORY:  Rhonda [name] returns today for follow-up of her estrogen receptor positive breast cancer. She is taking anastrozole as prescribed. She denies any new breast changes.  She continues to walk regularly.  She has recently seen a new primary care physician and she is very satisfied by this visit.  She was however worried that the primary care physician recommended a repeat ultrasound of the thyroid, she had a thyroid nodule back in 2019 which was biopsied and was benign.  She was wondering why she had to go through thyroid ultrasound again since this was benign.  Besides this, she is understandably worried about the risk of breast cancer recurrence when she is done with anastrozole.  Rest of the pertinent 10 point ROS reviewed and negative  REVIEW OF SYSTEMS:   COVID 19 VACCINATION STATUS:  Fully vaccinated [employer]), with booster 11/2020   HISTORY OF CURRENT ILLNESS: From the original intake note:  Rhonda [name] had routine screening mammography on 02/12/2019 showing a possible abnormality in the right breast. She underwent  unilateral right diagnostic mammography with tomography and right breast ultrasonography at [facility] on 02/17/2019 showing: Breast Density Category C. a persistent irregular mass measuring approximately 0.8 cm. Ultrasound of the right breast at 12 o'clock, 5 cm from the nipple demonstrates an irregular hypoechoic mass with indistinct margins measuring 0.7 x 0.5 x 0.7 cm. No blood flow seen within the mass on color Doppler imaging. Ultrasound of the right axilla demonstrates multiple normal-appearing nodes.  Accordingly on 02/22/2019 she proceeded to biopsy of the right breast area in question. 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 Prognostic indicators significant for: estrogen receptor, 100% positive and progesterone receptor, 100% positive, both with strong staining intensity. Proliferation marker Ki67 at 1%. HER2 equivocal (2+) by immunohistochemistry but negative by fluorescent in situ hybridization with a signals ratio 1.32 and number per cell 1.85.   The patient's subsequent history is as detailed below.   PAST MEDICAL HISTORY: Past Medical History:  Diagnosis Date   Allergy    seasonal   Anxiety    Cancer (HCC) 02/2019   right breast IDC   Family history of breast cancer    Family history of prostate cancer    Fibroid    Glaucoma    Leg pain, bilateral    Lung nodules    benign   Other and unspecified hyperlipidemia 04/05/2013   Peripheral vascular disease (HCC)    chronic venous insufficiency bilaterally   Personal history of radiation therapy    PONV (postoperative nausea and vomiting)    Varicose veins of both lower extremities     PAST SURGICAL HISTORY: Past Surgical History:  Procedure Laterality Date   BLADDER SUSPENSION  2010  Dr.Silva   BREAST CYST ASPIRATION Right 09/08/2012   BREAST LUMPECTOMY Right 03/19/2019   BREAST LUMPECTOMY WITH RADIOACTIVE SEED AND SENTINEL LYMPH NODE  BIOPSY Right 03/22/2019   Procedure: RIGHT BREAST LUMPECTOMY WITH RADIOACTIVE SEED AND RIGHT AXILLARY SENTINEL LYMPH NODE BIOPSY;  Surgeon: Ovidio Kin, MD;  Location: [facility];  Service: General;  Laterality: Right;   INSERTION OF MESH N/A 11/01/2016   Procedure: INSERTION OF MESH;  Surgeon: Avel Peace, MD;  Location: [facility] OR;  Service: General;  Laterality: N/A;   RHINOPLASTY     TONSILLECTOMY     varicose veins     VEIN LIGATION AND STRIPPING  1995   VENTRAL HERNIA REPAIR N/A 11/01/2016   Procedure: VENTRAL HERNIA REPAIR WITH MESH;  Surgeon: Avel Peace, MD;  Location: [facility] OR;  Service: General;  Laterality: N/A;    FAMILY HISTORY: Family History  Problem Relation Age of Onset   Cancer Maternal Grandmother        breast cancer   Breast cancer Maternal Grandmother        unsure of age   Heart disease Maternal Grandfather    Stroke Brother    Alcohol abuse Brother    Liver disease Brother    Varicose Veins Brother    Heart disease Brother    Cancer Mother    Tracheal cancer Mother    Varicose Veins Father    Heart disease Father    Prostate cancer Father    Heart attack Brother    Mental illness Brother        d. 16   Varicose Veins Paternal Grandmother    Prostate cancer Brother        possible, unsure if BPA vs prostate cancer  Collier's father died from congestive heart failure at age 52. Patients' mother died from tracheal carcinoma at age 57. The patient has 4 brothers and 1 sister. Patient denies anyone in her family having ovarian, or pancreatic cancer. Lakresha's mother was diagnosed with tracheal carcinoma and acoustic neuroma at age 33. Sherril's father was diagnosed with prostate cancer in his 22's. Marna's brother was diagnosed with prostate cancer in his mid 86's. Reata's maternal grandmother was diagnosed with breast cancer in her late 60's.    GYNECOLOGIC HISTORY:  Patient's last menstrual period was 04/05/2016 (approximate). 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 Her husband, Brett Canales, is a retired Magazine features editor for [employer]. They have two children, Physiological scientist and [employer]. Viviann Spare is 13, lives in [place], [place], and is a music professor. Orpah Clinton is 41, lives in [place], [place], and recently graduated with a degree in business.  The patient attends our [employer].   ADVANCED DIRECTIVES: In the absence of any documentation, Darling's spouse, Brett Canales, is her healthcare power of attorney.      HEALTH MAINTENANCE: Social History   Tobacco Use   Smoking status: Never   Smokeless tobacco: Never  Vaping Use   Vaping Use: Never used  Substance Use Topics   Alcohol use: Yes    Alcohol/week: 1.0 standard drink of alcohol    Types: 1 Glasses of wine per week   Drug use: No    Colonoscopy: yes, 05/2017, normal; Dr. Beverely Risen   PAP: 03/2017  Bone density: August 11, 2008, showed a T score of -2.1 Mammography: 02/12/2019  Allergies  Allergen Reactions   Fluconazole Anaphylaxis and Other (See Comments)    Tongue swelling Other reaction(s): Anaphalaxis    Current Outpatient Medications  Medication Sig Dispense Refill   amLODipine (NORVASC) 5 MG tablet Take 5 mg by mouth daily.     anastrozole (ARIMIDEX) 1 MG tablet TAKE 1 TABLET BY MOUTH EVERY DAY 90 tablet 2   Ascorbic Acid (VITAMIN C) 1000 MG tablet Take 1,000 mg by mouth daily.     lactobacillus acidophilus (BACID) TABS tablet Take 1 tablet by mouth 3 (three) times a week. Reported on 12/12/2015     LORazepam (ATIVAN) 0.5 MG tablet Take 0.5 mg by mouth at bedtime.     Magnesium 300 MG CAPS Take 1 capsule by mouth daily.     multivitamin-lutein (OCUVITE-LUTEIN) CAPS Take 1 capsule by mouth daily.     OVER THE COUNTER MEDICATION Vitamin D 3 2000 iu taking in  the a.m.     OVER THE COUNTER MEDICATION Flaxseed oil 1000 mg taking daily     Tafluprost (ZIOPTAN OP) Apply to eye.     No current facility-administered medications for this visit.    OBJECTIVE:  white woman who appears stated age  Vitals:   05/08/23 1053  BP: (!) 145/75  Pulse: 84  Resp: 16  Temp: 99 F (37.2 C)  SpO2: 100%      Body mass index is 27.87 kg/m.   Wt Readings from Last 3 Encounters:  05/08/23 167 lb 8 oz (76 kg)  08/08/22 168 lb (76.2 kg)  04/30/22 169 lb 11.2 oz (77 kg)   In addition to the vitals above the patient brought me a list of her blood pressures which she takes in the morning most days.  Control is adequate.    ECOG FS:1 - Symptomatic but completely ambulatory  Physical Exam Constitutional:      Appearance: Normal appearance.  Cardiovascular:     Rate and Rhythm: Normal rate and regular rhythm.  Chest:     Comments: Bilateral breasts inspected.  No palpable masses or regional adenopathy Musculoskeletal:     Cervical back: Normal range of motion and neck supple.  Neurological:     Mental Status: She is alert.      LAB RESULTS:  CMP     Component Value Date/Time   NA 140 05/08/2023 1032   K 4.7 05/08/2023 1032   CL 105 05/08/2023 1032   CO2 30 05/08/2023 1032   GLUCOSE 94 05/08/2023 1032   BUN 19 05/08/2023 1032   CREATININE 0.77 05/08/2023 1032   CREATININE 0.74 12/12/2015 1722   CALCIUM 9.4 05/08/2023 1032   PROT 7.3 05/08/2023 1032   ALBUMIN 4.5 05/08/2023 1032   AST 20 05/08/2023 1032   ALT 17 05/08/2023 1032   ALKPHOS 70 05/08/2023 1032   BILITOT 0.9 05/08/2023 1032   GFRNONAA >60 05/08/2023 1032   GFRNONAA >89 12/12/2015 1722   GFRAA >60 03/21/2020 1143   GFRAA >60 03/03/2019 0820   GFRAA >89 12/12/2015 1722    No results found for: "TOTALPROTELP", "ALBUMINELP", "A1GS", "A2GS", "BETS", "BETA2SER", "GAMS", "MSPIKE", "SPEI"  No results found for: "KPAFRELGTCHN", "LAMBDASER", "KAPLAMBRATIO"  Lab Results  Component  Value Date   WBC 5.4 05/08/2023   NEUTROABS 3.5 05/08/2023   HGB 15.6 (H) 05/08/2023   HCT 46.2 (H) 05/08/2023   MCV 89.5 05/08/2023   PLT 215 05/08/2023   No results found for: "LABCA2"  No components found for: "BMWUXL244"  No results for input(s): "INR" in the last 168 hours.  No results found for: "LABCA2"  No results found for: "WNU272"  No results found for: "CAN125"  No results found for: "CAN153"  No results found for: "CA2729"  No components found for: "HGQUANT"  No results found for: "CEA1", "CEA" / No results found for: "CEA1", "CEA"   No results found for: "AFPTUMOR"  No results found for: "CHROMOGRNA"  No results found for: "HGBA", "HGBA2QUANT", "HGBFQUANT", "HGBSQUAN" (Hemoglobinopathy evaluation)   No results found for: "LDH"  No results found for: "IRON", "TIBC", "IRONPCTSAT" (Iron and TIBC)  No results found for: "FERRITIN"  Urinalysis    Component Value Date/Time   COLORURINE YELLOW 05/29/2009 1120   APPEARANCEUR CLEAR 05/29/2009 1120   LABSPEC 1.015 05/29/2009 1120   PHURINE 7.5 05/29/2009 1120   GLUCOSEU NEGATIVE 05/29/2009 1120   HGBUR NEGATIVE 05/29/2009 1120   BILIRUBINUR negative 12/12/2015 1735   BILIRUBINUR n 02/13/2015 1150   KETONESUR negative 12/12/2015 1735   KETONESUR NEGATIVE 05/29/2009 1120   PROTEINUR negative 12/12/2015 1735   PROTEINUR n 02/13/2015 1150   PROTEINUR NEGATIVE 05/29/2009 1120   UROBILINOGEN 0.2 12/12/2015 1735   UROBILINOGEN 0.2 05/29/2009 1120   NITRITE Negative 12/12/2015 1735   NITRITE n 02/13/2015 1150   NITRITE NEGATIVE 05/29/2009 1120   LEUKOCYTESUR Negative 12/12/2015 1735    STUDIES:  No results found.   ELIGIBLE FOR AVAILABLE RESEARCH PROTOCOL: no   ASSESSMENT: 62 y.o. [place], [place] woman status post right breast upper inner quadrant biopsy 02/22/2019 for a clinical T1b N0, stage IA base of ductal carcinoma, rate 1, estrogen and progesterone receptor positive, HER-2 negative by FISH,  with an MIB-1 of 1%  (1) status post right lumpectomy and sentinel lymph node sampling 03/22/2019 for a pT1c pN0, stage IA invasive ductal carcinoma, with negative margins.  (a) a total of 3 sentinel lymph nodes were removed  (2) Oncotype DX score of 14 predicts a risk of recurrence outside the breast in the next 9 years of 4% if the patient's only systemic treatment is antiestrogens for 5 years.  It also predicts no benefit from chemotherapy  (3) adjuvant radiation 05/24/2019 - 07/08/2019  (a) right breast / 50.4 Gy in 28 fractions  (b) boost / 10 Gy in 5 fractions  (4) anastrozole started 04/02/2019  (a) bone density 10/17/2003 showed a T score of -2.1  (b) bone density 08/11/2008 showed a T score of -2.1  (5) genetics testing 07/19/2019 through the Common Hereditary Gene Panel offered by [facility] found no deleterious mutations in APC, ATM, AXIN2, BARD1, BMPR1A, BRCA1, BRCA2, BRIP1, CDH1, CDK4, CDKN2A (p14ARF), CDKN2A (p16INK4a), CHEK2, CTNNA1, DICER1, EPCAM (Deletion/duplication testing only), GREM1 (promoter region deletion/duplication testing only), KIT, MEN1, MLH1, MSH2, MSH3, MSH6, MUTYH, NBN, NF1, NHTL1, PALB2, PDGFRA, PMS2, POLD1, POLE, PTEN, RAD50, RAD51C, RAD51D, RNF43, SDHB, SDHC, SDHD, SMAD4, SMARCA4. STK11, TP53, TSC1, TSC2, and VHL.  The following genes were evaluated for sequence changes only: SDHA and HOXB13 c.251G>A variant only.    PLAN:  Ms. Thearsa is here for follow-up on anastrozole.  She has been tolerating this very well except for some fatigue. She is very worried about thyroid nodule and monitoring since she is going to have an [place] again. She tells me that she will be worried when she completes anastrozole in April 2025.  She wants to do a bone density scan after she completes anastrozole.  Previously she was very reluctant to even do this.  She wanted to just focus on weightbearing exercises but apparently after her discussion with her primary care physician, she wants to  do it even though she will not consider taking bisphosphonate.  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  I have tried to reassure her about her thyroid nodule which was benign in 2019 and I encouraged her to follow recommendations per her primary care physician.  With regards to the breast cancer, I believe she is doing well, no concerns for recurrence today.  Mammogram March 2024, BI-RADS Category 2  Total encounter time 40 minutes.*  *Total Encounter Time as defined by the Centers for Medicare and Medicaid Services includes, in addition to the face-to-face time of a patient visit (documented in the note above) non-face-to-face time: obtaining and reviewing outside history, ordering and reviewing medications, tests or procedures, care coordination (communications with other health care professionals or caregivers) and documentation in the medical record.

## 2023-05-16 DIAGNOSIS — E041 Nontoxic single thyroid nodule: Secondary | ICD-10-CM | POA: Diagnosis not present

## 2023-07-31 NOTE — Progress Notes (Signed)
61 y.o. G39P2012 Married Caucasian female here for annual exam.  Wants to discuss bladder prolapse. She has concern that her uterus is also fall.  Has a little bit of urinary incontinence.  No leak with cough, laugh or sneeze.  Some difficulty to void.  Up a couple of times a night to void.  Controlling her bladder irritants.  No constipation.  No fecal incontinence.  No problems with sexual functioning.   Feeling heaviness at the end of the day.  Does not bother her every day.   Using aloe vera for vaginal prolapse/moisture.   No bleeding.   PCP:   Kirby Funk, MD  Patient's last menstrual period was 04/05/2016 (approximate).           Sexually active: Yes.    The current method of family planning is post menopausal status.    Exercising: Yes.     walking Smoker:  no  Health Maintenance: Pap:  08/08/22 neg: HR HPV neg, 03/12/17 neg, neg HR HPV History of abnormal Pap:  yes, 2014, hx of ASCUS MMG:  02/21/23 Breast density Cat C, BI-RADS CAT 2 benign Colonoscopy:  2018 - due in 2028 BMD:   08/11/08 Result  osteopenia. TDaP:  2018 Gardasil:   no HIV: 2017 NR Hep C: 2017 neg Screening Labs: PCP   reports that she has never smoked. She has never used smokeless tobacco. She reports current alcohol use of about 1.0 standard drink of alcohol per week. She reports that she does not use drugs.  Past Medical History:  Diagnosis Date   Allergy    seasonal   Anxiety    Cancer (HCC) 02/2019   right breast IDC   Family history of breast cancer    Family history of prostate cancer    Fibroid    Glaucoma    Leg pain, bilateral    Lung nodules    benign   Other and unspecified hyperlipidemia 04/05/2013   Peripheral vascular disease (HCC)    chronic venous insufficiency bilaterally   Personal history of radiation therapy    PONV (postoperative nausea and vomiting)    Varicose veins of both lower extremities     Past Surgical History:  Procedure Laterality Date   BLADDER SUSPENSION   2010   Dr.Silva   BREAST CYST ASPIRATION Right 09/08/2012   BREAST LUMPECTOMY Right 03/19/2019   BREAST LUMPECTOMY WITH RADIOACTIVE SEED AND SENTINEL LYMPH NODE BIOPSY Right 03/22/2019   Procedure: RIGHT BREAST LUMPECTOMY WITH RADIOACTIVE SEED AND RIGHT AXILLARY SENTINEL LYMPH NODE BIOPSY;  Surgeon: Ovidio Kin, MD;  Location: [facility];  Service: General;  Laterality: Right;   INSERTION OF MESH N/A 11/01/2016   Procedure: INSERTION OF MESH;  Surgeon: Avel Peace, MD;  Location: [facility] OR;  Service: General;  Laterality: N/A;   RHINOPLASTY     TONSILLECTOMY     varicose veins     VEIN LIGATION AND STRIPPING  1995   VENTRAL HERNIA REPAIR N/A 11/01/2016   Procedure: VENTRAL HERNIA REPAIR WITH MESH;  Surgeon: Avel Peace, MD;  Location: [facility] OR;  Service: General;  Laterality: N/A;    Current Outpatient Medications  Medication Sig Dispense Refill   amLODipine (NORVASC) 5 MG tablet Take 5 mg by mouth daily.     anastrozole (ARIMIDEX) 1 MG tablet TAKE 1 TABLET BY MOUTH EVERY DAY 90 tablet 2   Ascorbic Acid (VITAMIN C) 1000 MG tablet Take 1,000 mg by mouth daily.     lactobacillus acidophilus (BACID) TABS  tablet Take 1 tablet by mouth 3 (three) times a week. Reported on 12/12/2015     LORazepam (ATIVAN) 0.5 MG tablet Take 0.5 mg by mouth at bedtime.     Magnesium 300 MG CAPS Take 1 capsule by mouth daily.     multivitamin-lutein (OCUVITE-LUTEIN) CAPS Take 1 capsule by mouth daily.     OVER THE COUNTER MEDICATION Vitamin D 3 2000 iu taking in the a.m.     OVER THE COUNTER MEDICATION Flaxseed oil 1000 mg taking daily     Tafluprost (ZIOPTAN OP) Apply to eye.     tolnaftate (FORMULA 3 THE TREATMENT) 1 % external solution      No current facility-administered medications for this visit.    Family History  Problem Relation Age of Onset   Cancer Maternal Grandmother        breast cancer   Breast cancer Maternal Grandmother        unsure of age   Heart disease Maternal  Grandfather    Stroke Brother    Alcohol abuse Brother    Liver disease Brother    Varicose Veins Brother    Heart disease Brother    Cancer Mother    Tracheal cancer Mother    Varicose Veins Father    Heart disease Father    Prostate cancer Father    Heart attack Brother    Mental illness Brother        d. 45   Varicose Veins Paternal Grandmother    Prostate cancer Brother        possible, unsure if BPA vs prostate cancer    Review of Systems  All other systems reviewed and are negative.   Exam:   Ht 5' 4.5" (1.638 m)   Wt 168 lb (76.2 kg)   LMP 04/05/2016 (Approximate)   BMI 28.39 kg/m     General appearance: alert, cooperative and appears stated age Head: normocephalic, without obvious abnormality, atraumatic Neck: no adenopathy, supple, symmetrical, trachea midline and thyroid normal to inspection and palpation Lungs: clear to auscultation bilaterally Breasts: normal appearance, no masses or tenderness, No nipple retraction or dimpling, No nipple discharge or bleeding, No axillary adenopathy Heart: regular rate and rhythm Abdomen: soft, non-tender; no masses, no organomegaly Extremities: extremities normal, atraumatic, no cyanosis or edema Skin: skin color, texture, turgor normal. No rashes or lesions Lymph nodes: cervical, supraclavicular, and axillary nodes normal. Neurologic: grossly normal  Pelvic: External genitalia:  no lesions              No abnormal inguinal nodes palpated.              Urethra:  normal appearing urethra with no masses, tenderness or lesions              Bartholins and Skenes: normal                 Vagina: normal appearing vagina with normal color and discharge, no lesions.  Second degree cystocele, first degree uterine prolapse, good posterior vaginal wall support.               Cervix: no lesions              Pap taken: no Bimanual Exam:  Uterus:  normal size, contour, position, consistency, mobility, non-tender              Adnexa: no  mass, fullness, tenderness              Rectal exam:  yes.  Confirms.              Anus:  normal sphincter tone, no lesions  Chaperone was present for exam:  Warren Lacy, [name]  Assessment:   Well woman visit with gynecologic exam. Postmenopausal female. Hx of mid-urethral sling.  Cystocele and uterine prolapse.  Symptomatic.  Remote hx of ASCUS.  Follow up normal.  Hx varicose veins.  Right breast cancer.  Status post lumpectomy with sentinal node, and XRT.  On Arimidex. Negative genetic testing.  Osteopenia.  Plan: Mammogram screening discussed. Self breast awareness reviewed. Pap and HR HPV 2028 Guidelines for Calcium, Vitamin D, regular exercise program including cardiovascular and weight bearing exercise. She will do BMD after she comes off Arimidex next year.   Prolapse discussed along with tx options: pelvic floor therapy, pessary, and surgical consultation discussed.  Patient opts for pelvic floor therapy and referral to Dr. Florian Buff.  I would do these simultaneously.  Follow up annually and prn.

## 2023-08-14 ENCOUNTER — Encounter: Payer: Self-pay | Admitting: Obstetrics and Gynecology

## 2023-08-14 ENCOUNTER — Telehealth: Payer: Self-pay | Admitting: Obstetrics and Gynecology

## 2023-08-14 ENCOUNTER — Ambulatory Visit (INDEPENDENT_AMBULATORY_CARE_PROVIDER_SITE_OTHER): Payer: BC Managed Care – PPO | Admitting: Obstetrics and Gynecology

## 2023-08-14 VITALS — BP 130/86 | HR 84 | Ht 64.5 in | Wt 168.0 lb

## 2023-08-14 DIAGNOSIS — N8110 Cystocele, unspecified: Secondary | ICD-10-CM

## 2023-08-14 DIAGNOSIS — N814 Uterovaginal prolapse, unspecified: Secondary | ICD-10-CM | POA: Diagnosis not present

## 2023-08-14 DIAGNOSIS — Z01419 Encounter for gynecological examination (general) (routine) without abnormal findings: Secondary | ICD-10-CM | POA: Diagnosis not present

## 2023-08-14 NOTE — Telephone Encounter (Signed)
Please make a referral to [facility] Pelvic Floor therapy for cystocele, uterine prolapse.   Please also make a referral to Dr. Florian Buff for the same diagnoses.

## 2023-08-14 NOTE — Telephone Encounter (Signed)
Referrals placed.   Routing to [employer].   Encounter closed.

## 2023-08-14 NOTE — Patient Instructions (Signed)

## 2023-08-14 NOTE — Telephone Encounter (Signed)
Routing to Dr. Edward Jolly to review and advise on question regarding bladder and uterus.

## 2023-08-27 NOTE — Telephone Encounter (Signed)
Patient can see any pelvic floor PT specialist with [facility] if it helps her to be seen sooner.

## 2023-09-11 ENCOUNTER — Other Ambulatory Visit: Payer: Self-pay

## 2023-09-11 ENCOUNTER — Encounter: Payer: Self-pay | Admitting: Physical Therapy

## 2023-09-11 ENCOUNTER — Ambulatory Visit: Payer: BC Managed Care – PPO | Attending: Obstetrics and Gynecology | Admitting: Physical Therapy

## 2023-09-11 DIAGNOSIS — M6281 Muscle weakness (generalized): Secondary | ICD-10-CM | POA: Diagnosis not present

## 2023-09-11 DIAGNOSIS — R279 Unspecified lack of coordination: Secondary | ICD-10-CM | POA: Insufficient documentation

## 2023-09-11 DIAGNOSIS — N814 Uterovaginal prolapse, unspecified: Secondary | ICD-10-CM | POA: Diagnosis not present

## 2023-09-11 DIAGNOSIS — M62838 Other muscle spasm: Secondary | ICD-10-CM | POA: Diagnosis not present

## 2023-09-11 DIAGNOSIS — Z853 Personal history of malignant neoplasm of breast: Secondary | ICD-10-CM | POA: Insufficient documentation

## 2023-09-11 DIAGNOSIS — R278 Other lack of coordination: Secondary | ICD-10-CM

## 2023-09-11 NOTE — Therapy (Addendum)
OUTPATIENT PHYSICAL THERAPY FEMALE PELVIC EVALUATION   Patient Name: Rhonda [name] MRN: [id number] DOB:[birth year], 62 y.o., female Today's Date: 09/12/2023  END OF SESSION:  PT End of Session - 09/11/23 1506     Visit Number 1    Authorization Type bcbs    PT Start Time 1400    PT Stop Time 1500    PT Time Calculation (min) 60 min    Activity Tolerance Patient tolerated treatment well    Behavior During Therapy WFL for tasks assessed/performed             Past Medical History:  Diagnosis Date   Allergy    seasonal   Anxiety    Cancer (HCC) 02/2019   right breast IDC   Family history of breast cancer    Family history of prostate cancer    Fibroid    Glaucoma    Leg pain, bilateral    Lung nodules    benign   Other and unspecified hyperlipidemia 04/05/2013   Peripheral vascular disease (HCC)    chronic venous insufficiency bilaterally   Personal history of radiation therapy    PONV (postoperative nausea and vomiting)    Varicose veins of both lower extremities    Past Surgical History:  Procedure Laterality Date   BLADDER SUSPENSION  2010   Dr.Silva   BREAST CYST ASPIRATION Right 09/08/2012   BREAST LUMPECTOMY Right 03/19/2019   BREAST LUMPECTOMY WITH RADIOACTIVE SEED AND SENTINEL LYMPH NODE BIOPSY Right 03/22/2019   Procedure: RIGHT BREAST LUMPECTOMY WITH RADIOACTIVE SEED AND RIGHT AXILLARY SENTINEL LYMPH NODE BIOPSY;  Surgeon: Ovidio Kin, MD;  Location: [facility];  Service: General;  Laterality: Right;   INSERTION OF MESH N/A 11/01/2016   Procedure: INSERTION OF MESH;  Surgeon: Avel Peace, MD;  Location: [facility] OR;  Service: General;  Laterality: N/A;   RHINOPLASTY     TONSILLECTOMY     varicose veins     VEIN LIGATION AND STRIPPING  1995   VENTRAL HERNIA REPAIR N/A 11/01/2016   Procedure: VENTRAL HERNIA REPAIR WITH MESH;  Surgeon: Avel Peace, MD;  Location: [facility] OR;  Service: General;  Laterality: N/A;   Patient Active Problem List    Diagnosis Date Noted   Genetic testing 07/19/2019   Family history of breast cancer    Family history of prostate cancer    Osteopenia 06/07/2019   Malignant neoplasm of upper-inner quadrant of right breast in female, estrogen receptor positive (HCC) 03/01/2019   DUB (dysfunctional uterine bleeding) 09/21/2013   Anxiety    Other and unspecified hyperlipidemia 04/05/2013    PCP: Kirby Funk, MD   REFERRING PROVIDER: Patton Salles, MD   REFERRING DIAG:  Uterine prolapse  - Primary    Female bladder prolapse      THERAPY DIAG:  Other lack of coordination - Plan: PT plan of care cert/re-cert  Muscle weakness (generalized) - Plan: PT plan of care cert/re-cert  Rationale for Evaluation and Treatment: Rehabilitation  ONSET DATE: 02/2023  SUBJECTIVE:  SUBJECTIVE STATEMENT: Pt reports that she had a rectocele and  cystocele repair in 2010. She went to get checked in August, she saw that there is some prolapse, uterus is pushing down. Awaiting hysterectomy soon. She reports that that she power walks 3-4 miles/ day. She has been on medication for 5 years for breast cancer that reduced her estrogen levels. Her son is a PTA, willing to help, they have exercise equipment at home/ home gym  Fluid intake: Yes:      PAIN:  Are you having pain? No  PRECAUTIONS: None  RED FLAGS: None   WEIGHT BEARING RESTRICTIONS: No  FALLS:  Has patient fallen in last 6 months? No  LIVING ENVIRONMENT: Lives with: lives with their spouse Lives in: House/apartment  OCCUPATION: retired Armed forces operational officer  PLOF: Independent  PATIENT GOALS: wants to get the surgery over with, wants to have improved bladder emptying  PERTINENT HISTORY:  Lumpectomy 2019,  Sexual abuse: No  BOWEL MOVEMENT: Pain with  bowel movement: No Type of bowel movement:Strain No Fully empty rectum: Yes:   Leakage: No Pads: No Fiber supplement: No  URINATION: Pain with urination: No Fully empty bladder: Yes: has to make a couple trips  Stream: Strong Urgency: Yes: at  night Frequency: 3-4 no, after a meal Leakage:  not really, maybe a couple of drops Pads: No liner at times  INTERCOURSE: Pain with intercourse: no Ability to have vaginal penetration:  Yes:    PREGNANCY: Vaginal deliveries 2 big boys Tearing Yes: episiotomy C-section deliveries 0 Currently pregnant No  PROLAPSE: Cystocele Inconsistent heavy feeling   OBJECTIVE:   DIAGNOSTIC FINDINGS:  Upper chest breathing, difficulty with DB Decreased pevic floor awareness Repaired abdominal hernia Holds abdomen tight    COGNITION: Overall cognitive status: Within functional limits for tasks assessed     SENSATION: Light touch: Appears intact Proprioception: Appears intact    POSTURE: rounded shoulders, forward head, and increased thoracic kyphosis  LLOWER EXTREMITY MMT:  MMT Right eval Left eval  Hip flexion 4/5 4/5  Hip extension    Hip abduction    Hip adduction    Hip internal rotation    Hip external rotation    Knee flexion    Knee extension    Ankle dorsiflexion    Ankle plantarflexion    Ankle inversion    Ankle eversion     PALPATION:    Patient confirms identification and approves PT to assess internal pelvic floor and treatment Yes next visit  PELVIC MMT:   MMT eval  Vaginal   Internal Anal Sphincter   External Anal Sphincter   Puborectalis   Diastasis Recti   (Blank rows = not tested)        TONE: Next visit  PROLAPSE: Next visit  TODAY'S TREATMENT:  DATE: 09/12/23   EVAL see above   PATIENT EDUCATION:  Education details: diahragmatic breathing reed, relevant  core and pelvic floor Person educated: Patient Education method: Programmer, multimedia, Facilities manager, and Handouts Education comprehension: verbalized understanding, returned demonstration, verbal cues required, tactile cues required, and needs further education  HOME EXERCISE PROGRAM: VAEAETJK, difficulty with TRA breath with bridge, green thera band to help with DB around lower ribs  ASSESSMENT:  CLINICAL IMPRESSION: Patient is a 62 y.o. female who was seen today for physical therapy evaluation and treatment for pelvic floor strengthening prior to her hysterectomy. She seems concerned about her surgery and recovery. Did well today with education, had difficulty with coordination breathing and abdominal and pelvic floor contraction and relaxation. She will benefit from PT to help with correct core coordination for better exercise form pre and post surgery.   OBJECTIVE IMPAIRMENTS: decreased coordination, decreased ROM, decreased strength, and increased muscle spasms.   ACTIVITY LIMITATIONS:  exercise, standing, cooking- feels increased heaviness  PARTICIPATION LIMITATIONS: cleaning  PERSONAL FACTORS: Time since onset of injury/illness/exacerbation are also affecting patient's functional outcome.   REHAB POTENTIAL: Good  CLINICAL DECISION MAKING: Stable/uncomplicated  EVALUATION COMPLEXITY: Low   GOALS: Goals reviewed with patient? Yes  SHORT TERM GOALS: Target date: 10/09/2023    Pt will be able to correctly perform diaphragmatic breathing and appropriate pressure management in order to prevent worsening vaginal wall laxity and improve pelvic floor A/ROM.   Baseline: Goal status: INITIAL  2.  Pt will demonstrate increase in all impaired hip strength by 1 muscle grade in order to demonstrate improved lumbopelvic support and increase functional ability.   Baseline:  Goal status: INITIAL                3. Pt will be independent with HEP.   Baseline: Goal status: Initial  LONG TERM  GOALS: Target date: 12/04/2023    Pt will be independent with advanced HEP.   Baseline:  Goal status: INITIAL   PLAN:  PT FREQUENCY: 1x/week  PT DURATION: 12 weeks  PLANNED INTERVENTIONS: Therapeutic exercises, Therapeutic activity, Neuromuscular re-education, Balance training, Gait training, Patient/Family education, Self Care, Joint mobilization, Joint manipulation, Spinal manipulation, Spinal mobilization, scar mobilization, and Manual therapy  PLAN FOR NEXT SESSION: internal, continue strengthening   Rhonda [name] 09/12/2023, 7:23 PM

## 2023-09-15 DIAGNOSIS — H401131 Primary open-angle glaucoma, bilateral, mild stage: Secondary | ICD-10-CM | POA: Diagnosis not present

## 2023-09-18 ENCOUNTER — Ambulatory Visit: Payer: BC Managed Care – PPO | Attending: Obstetrics and Gynecology | Admitting: Physical Therapy

## 2023-09-18 ENCOUNTER — Encounter: Payer: Self-pay | Admitting: Physical Therapy

## 2023-09-18 DIAGNOSIS — M6281 Muscle weakness (generalized): Secondary | ICD-10-CM | POA: Diagnosis not present

## 2023-09-18 DIAGNOSIS — R278 Other lack of coordination: Secondary | ICD-10-CM | POA: Insufficient documentation

## 2023-09-18 NOTE — Patient Instructions (Signed)
Overview The pelvic organs, including the bladder, are normally supported by pelvic floor muscles and ligaments.   When these muscles and ligaments are stretched, weakened or torn, the wall between the bladder and the vagina sags or herniates causing a prolapse, sometimes called a cystocele. This condition may cause discomfort and problems with emptying the bladder.  It can be present in various stages.  Some people are not aware of the changes. Others may notice changes at the vaginal opening or a feeling of the bladder dropping outside the body. Causes of a Cystocele A cystocele is usually caused by muscle straining or stretching during childbirth.  In addition, cystocele is more common after menopause, because the hormone estrogen helps keep the elastic tissues around the pelvic organs strong.  A cystocele is more likely to occur when levels of estrogen decrease. Other causes include: heavy lifting, chronic coughing, previous pelvic surgery and obesity.  Symptoms A bladder that has dropped from its normal position may cause: unwanted urine leakage (stress incontinence), frequent urination or urge to urinate, incomplete emptying of the bladder (not feeling bladder relief after emptying), pain or discomfort in the vagina, pelvis, groin, lower back or lower abdomen and frequent urinary tract infections.  Mild cases may not cause any symptoms.  Treatment Options Pelvic floor (Kegel) exercises: Strength training the muscles in your genital area  Behavioral changes: Treating and preventing constipation, taking time to empty your bladder properly, learning to lift properly and/or  avoid heavy lifting when possible, stopping smoking, avoiding weight gain and treating a chronic cough or bronchitis. A pessary: A vaginal support device is sometimes used to help pelvic support caused by muscle and ligament changes. Surgery: Aurgical repair may be necessary if symptoms cannot be managed with exercise, behavioral  changes and a pessary. Surgery is usually considered for severe cases.

## 2023-09-18 NOTE — Therapy (Addendum)
 OUTPATIENT PHYSICAL THERAPY FEMALE PELVIC TREATMENT/ later day discharge   Patient Name: Rhonda [name] MRN: [id number] DOB:[birth year], 62 y.o., female Today's Date: 09/18/2023  END OF SESSION:  PT End of Session - 09/18/23 1606     Visit Number 2    Authorization Type bcbs    PT Start Time 1400    PT Stop Time 1500    PT Time Calculation (min) 60 min    Activity Tolerance Patient tolerated treatment well    Behavior During Therapy [facility] for tasks assessed/performed              Past Medical History:  Diagnosis Date   Allergy    seasonal   Anxiety    Cancer (HCC) 02/2019   right breast IDC   Family history of breast cancer    Family history of prostate cancer    Fibroid    Glaucoma    Leg pain, bilateral    Lung nodules    benign   Other and unspecified hyperlipidemia 04/05/2013   Peripheral vascular disease (HCC)    chronic venous insufficiency bilaterally   Personal history of radiation therapy    PONV (postoperative nausea and vomiting)    Varicose veins of both lower extremities    Past Surgical History:  Procedure Laterality Date   BLADDER SUSPENSION  2010   Dr.Silva   BREAST CYST ASPIRATION Right 09/08/2012   BREAST LUMPECTOMY Right 03/19/2019   BREAST LUMPECTOMY WITH RADIOACTIVE SEED AND SENTINEL LYMPH NODE BIOPSY Right 03/22/2019   Procedure: RIGHT BREAST LUMPECTOMY WITH RADIOACTIVE SEED AND RIGHT AXILLARY SENTINEL LYMPH NODE BIOPSY;  Surgeon: Ovidio Kin, MD;  Location: [facility];  Service: General;  Laterality: Right;   INSERTION OF MESH N/A 11/01/2016   Procedure: INSERTION OF MESH;  Surgeon: Avel Peace, MD;  Location: [facility] OR;  Service: General;  Laterality: N/A;   RHINOPLASTY     TONSILLECTOMY     varicose veins     VEIN LIGATION AND STRIPPING  1995   VENTRAL HERNIA REPAIR N/A 11/01/2016   Procedure: VENTRAL HERNIA REPAIR WITH MESH;  Surgeon: Avel Peace, MD;  Location: [facility] OR;  Service: General;  Laterality: N/A;   Patient  Active Problem List   Diagnosis Date Noted   Genetic testing 07/19/2019   Family history of breast cancer    Family history of prostate cancer    Osteopenia 06/07/2019   Malignant neoplasm of upper-inner quadrant of right breast in female, estrogen receptor positive (HCC) 03/01/2019   DUB (dysfunctional uterine bleeding) 09/21/2013   Anxiety    Other and unspecified hyperlipidemia 04/05/2013    PCP: Kirby Funk, MD   REFERRING PROVIDER: Patton Salles, MD   REFERRING DIAG:  Uterine prolapse  - Primary    Female bladder prolapse      THERAPY DIAG:  Other lack of coordination  Muscle weakness (generalized)  Rationale for Evaluation and Treatment: Rehabilitation  ONSET DATE: 02/2023  SUBJECTIVE:       09/18/23  SUBJECTIVE STATEMENT: Pt reports that she has bad days and good days on her estrogen blocker, she is wondering if it is contributing to her prolapses. She is seeing Dr Tildon Husky next week. Her main concern is if her mesh is going to deteriorate. Pt reports that she does not have pain but she is worried that her sling might snap. She feels heaviness during the day. Wondering if when she is off the medicine, she will feel better.    Pt reports that she had a rectocele and  cystocele repair in 2010. She went to get checked in August, she saw that there is some prolapse, uterus is pushing down. Awaiting hysterectomy soon. She reports that that she power walks 3-4 miles/ day. She has been on medication for 5 years for breast cancer that reduced her estrogen levels. Her son is a PTA, willing to help, they have exercise equipment at home/ home gym  Fluid intake: Yes:      PAIN:  Are you having pain? No  PRECAUTIONS: None  RED FLAGS: None   WEIGHT BEARING RESTRICTIONS: No  FALLS:   Has patient fallen in last 6 months? No  LIVING ENVIRONMENT: Lives with: lives with their spouse Lives in: House/apartment  OCCUPATION: retired Armed forces operational officer  PLOF: Independent  PATIENT GOALS: wants to get the surgery over with, wants to have improved bladder emptying  PERTINENT HISTORY:  Lumpectomy 2019,  Sexual abuse: No  BOWEL MOVEMENT: Pain with bowel movement: No Type of bowel movement:Strain No Fully empty rectum: Yes:   Leakage: No Pads: No Fiber supplement: No  URINATION: Pain with urination: No Fully empty bladder: Yes: has to make a couple trips  Stream: Strong Urgency: Yes: at  night Frequency: 3-4 no, after a meal Leakage:  not really, maybe a couple of drops Pads: No liner at times  INTERCOURSE: Pain with intercourse: no Ability to have vaginal penetration:  Yes:    PREGNANCY: Vaginal deliveries 2 big boys Tearing Yes: episiotomy C-section deliveries 0 Currently pregnant No  PROLAPSE: Cystocele Inconsistent heavy feeling   OBJECTIVE:   DIAGNOSTIC FINDINGS:  Upper chest breathing, difficulty with DB Decreased pevic floor awareness Repaired abdominal hernia Holds abdomen tight    COGNITION: Overall cognitive status: Within functional limits for tasks assessed     SENSATION: Light touch: Appears intact Proprioception: Appears intact    POSTURE: rounded shoulders, forward head, and increased thoracic kyphosis  LLOWER EXTREMITY MMT:  MMT Right eval Left eval  Hip flexion 4/5 4/5  Hip extension    Hip abduction    Hip adduction    Hip internal rotation    Hip external rotation    Knee flexion    Knee extension    Ankle dorsiflexion    Ankle plantarflexion    Ankle inversion    Ankle eversion     PALPATION:    Patient confirms identification and approves PT to assess internal pelvic floor and treatment Yes next visit  PELVIC MMT:   MMT eval  Vaginal 3/5  Internal Anal Sphincter   External Anal Sphincter    Puborectalis   Diastasis Recti   (Blank rows = not tested)        TONE: Decreased vaginal wall laxity - bladder prolapse check supine and in standing  PROLAPSE: cystocele  TODAY'S TREATMENT:  DATE: 09/18/23   EVAL see above Manual:  Neuromuscular re-education:pelvic floor and core coordination, pressure management strategies- supine and in standing, in sitting, strategies to relieve prolapse symptoms  Exercises: horizontal abduction with thera band, bridging, ball press  Therapeutic activities: education on cystocele, prolapse in fitness     PATIENT EDUCATION:  Education details: diahragmatic breathing reed, relevant core and pelvic floor Person educated: Patient Education method: Programmer, multimedia, Demonstration, and Handouts Education comprehension: verbalized understanding, returned demonstration, verbal cues required, tactile cues required, and needs further education  HOME EXERCISE PROGRAM: VAEAETJK, difficulty with TRA breath with bridge, green thera band to help with DB around lower ribs  ASSESSMENT:  CLINICAL IMPRESSION:  Pt demonstrates pelvic floor weakness. Did well today with internal and pressure management. Pt was educated on pressure management strategies, Prolapse in Fitness exercises and Cystocele. She will benefit from cont PT. Appt with her surgeon next week. Concerned about cost of therapy.  EVAL:  Patient is a 61 y.o. female who was seen today for physical therapy evaluation and treatment for pelvic floor strengthening prior to her hysterectomy. She seems concerned about her surgery and recovery. Did well today with education, had difficulty with coordination breathing and abdominal and pelvic floor contraction and relaxation. She will benefit from PT to help with correct core coordination for better exercise form pre and post surgery.    OBJECTIVE IMPAIRMENTS: decreased coordination, decreased ROM, decreased strength, and increased muscle spasms.   ACTIVITY LIMITATIONS:  exercise, standing, cooking- feels increased heaviness  PARTICIPATION LIMITATIONS: cleaning  PERSONAL FACTORS: Time since onset of injury/illness/exacerbation are also affecting patient's functional outcome.   REHAB POTENTIAL: Good  CLINICAL DECISION MAKING: Stable/uncomplicated  EVALUATION COMPLEXITY: Low   GOALS: Goals reviewed with patient? Yes  SHORT TERM GOALS: Target date: 10/09/2023    Pt will be able to correctly perform diaphragmatic breathing and appropriate pressure management in order to prevent worsening vaginal wall laxity and improve pelvic floor A/ROM.   Baseline: Goal status: INITIAL  2.  Pt will demonstrate increase in all impaired hip strength by 1 muscle grade in order to demonstrate improved lumbopelvic support and increase functional ability.   Baseline:  Goal status: INITIAL                3. Pt will be independent with HEP.   Baseline: Goal status: Initial  LONG TERM GOALS: Target date: 12/04/2023    Pt will be independent with advanced HEP.   Baseline:  Goal status: INITIAL   PLAN:  PT FREQUENCY: 1x/week  PT DURATION: 12 weeks  PLANNED INTERVENTIONS: Therapeutic exercises, Therapeutic activity, Neuromuscular re-education, Balance training, Gait training, Patient/Family education, Self Care, Joint mobilization, Joint manipulation, Spinal manipulation, Spinal mobilization, scar mobilization, and Manual therapy  PLAN FOR NEXT SESSION: internal, continue strengthening   Murchison Ducre, PT 09/18/2023, 4:10 PM   PHYSICAL THERAPY DISCHARGE SUMMARY   Patient agrees to discharge. Patient goals were not met. Patient is being discharged due to not returning since the last visit.  Tameria Patti, PT 03/01/24 10:28 AM

## 2023-09-24 ENCOUNTER — Ambulatory Visit (INDEPENDENT_AMBULATORY_CARE_PROVIDER_SITE_OTHER): Payer: BC Managed Care – PPO | Admitting: Obstetrics and Gynecology

## 2023-09-24 ENCOUNTER — Encounter: Payer: Self-pay | Admitting: Obstetrics and Gynecology

## 2023-09-24 VITALS — BP 143/56 | HR 61 | Ht 64.1 in | Wt 166.6 lb

## 2023-09-24 DIAGNOSIS — N3281 Overactive bladder: Secondary | ICD-10-CM

## 2023-09-24 DIAGNOSIS — N812 Incomplete uterovaginal prolapse: Secondary | ICD-10-CM | POA: Diagnosis not present

## 2023-09-24 DIAGNOSIS — R350 Frequency of micturition: Secondary | ICD-10-CM

## 2023-09-24 LAB — POCT URINALYSIS DIPSTICK
Bilirubin, UA: NEGATIVE
Blood, UA: NEGATIVE
Glucose, UA: NEGATIVE
Ketones, UA: NEGATIVE
Leukocytes, UA: NEGATIVE
Nitrite, UA: NEGATIVE
Protein, UA: NEGATIVE
Spec Grav, UA: 1.015 (ref 1.010–1.025)
Urobilinogen, UA: 0.2 U/dL
pH, UA: 7 (ref 5.0–8.0)

## 2023-09-24 NOTE — Progress Notes (Signed)
[facility] New Patient Evaluation and Consultation  Referring Provider: Janean Sark* PCP: Kirby Funk, MD (Inactive) Date of Service: 09/24/2023  SUBJECTIVE Chief Complaint: New Patient (Initial Visit) Rhonda [name] is a 62 y.o. female is here for bladder/uterine prolapse. Patient had a bladder sling in 2010 buut its starting to fall again.)  History of Present Illness: Rhonda [name] is a 62 y.o. White or Caucasian female seen in consultation at the request of Dr. Ardell Isaacs for evaluation of prolapse.     Urinary Symptoms: Leaks urine with with a full bladder and with urgency Leaks a few drops a day Pad use: 2-3 liners/ mini-pads per day.   She is bothered by her UI symptoms. Hx of sling in 2010 with Dr Edward Jolly  Day time voids 4-5.  Nocturia: 2-3 times per night to void. Voiding dysfunction: she does not empty her bladder well.  does not use a catheter to empty bladder.  When urinating, she feels difficulty starting urine stream and dribbling after finishing Drinks: 1 decaf cup coffee in AM, water, protein shake per day  UTIs:  0  UTI's in the last year.   Denies history of blood in urine and kidney or bladder stones  Pelvic Organ Prolapse Symptoms:                  She Admits to a feeling of a bulge the vaginal area. It has been present for 6-9 months.  She Denies seeing a bulge.  This bulge is bothersome. History of vaginal prolapse repair with Dr Edward Jolly 2010 She has been doing pelvic physical therapy and has not noticed much improvement.   Bowel Symptom: Bowel movements: 1 time(s) per day Stool consistency: soft  Straining: no.  Splinting: no.  Incomplete evacuation: no.  She Denies accidental bowel leakage / fecal incontinence Bowel regimen: stool softener  Sexual Function Sexually active: yes.  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   Family history of breast cancer    Family history of prostate cancer    Fibroid    Glaucoma    open angle   Leg pain, bilateral    Lung nodules    benign   Other and unspecified hyperlipidemia 04/05/2013   Peripheral vascular disease (HCC)    chronic venous insufficiency bilaterally   Personal history of radiation therapy    PONV (postoperative nausea and vomiting)    Varicose veins of both lower extremities      Past Surgical History:   Past Surgical History:  Procedure Laterality Date   BLADDER SUSPENSION  2010   Dr.Silva   BREAST CYST ASPIRATION Right 09/08/2012   BREAST LUMPECTOMY Right 03/19/2019   BREAST LUMPECTOMY WITH RADIOACTIVE SEED AND SENTINEL LYMPH NODE BIOPSY Right 03/22/2019   Procedure: RIGHT BREAST LUMPECTOMY WITH RADIOACTIVE SEED AND RIGHT AXILLARY SENTINEL LYMPH NODE BIOPSY;  Surgeon: Ovidio Kin, MD;  Location: [facility];  Service: General;  Laterality: Right;   INSERTION OF MESH N/A 11/01/2016   Procedure: INSERTION OF MESH;  Surgeon: Avel Peace, MD;  Location: [facility] OR;  Service: General;  Laterality: N/A;   RHINOPLASTY     TONSILLECTOMY     VAGINAL PROLAPSE REPAIR  cystocele and rectocele repair   varicose veins     VEIN LIGATION AND STRIPPING  1995   VENTRAL HERNIA REPAIR N/A 11/01/2016   Procedure: VENTRAL HERNIA REPAIR WITH MESH;  Surgeon: Avel Peace, MD;  Location: [facility] OR;  Service: General;  Laterality: N/A;     Past OB/GYN History: OB History  Gravida Para Term Preterm AB Living  3 2 2   1 2   SAB IAB Ectopic Multiple Live Births  1            # Outcome Date GA Lbr Len/2nd Weight Sex Type Anes PTL Lv  3 SAB           2 Term      Vag-Spont     1 Term      Vag-Spont       Menopausal: Denies vaginal bleeding since menopause Last pap smear was 07/2022- negative.      Medications: She has a current medication list which includes the following prescription(s): amlodipine, anastrozole, vitamin c, lactobacillus acidophilus, lorazepam, magnesium, multivitamin-lutein, OVER THE COUNTER MEDICATION, OVER THE COUNTER MEDICATION, tafluprost, and formula 3 the treatment.   Allergies: Patient is allergic to fluconazole.   Social History:  Social History   Tobacco Use   Smoking status: Never   Smokeless tobacco: Never  Vaping Use   Vaping status: Never Used  Substance Use Topics   Alcohol use: Yes    Alcohol/week: 1.0 standard drink of alcohol    Types: 1 Glasses of wine per week   Drug use: Never    Relationship status: married She lives with husband.   She is not employed- retired. Regular exercise: Yes: walking, hiking History of abuse: No  Family History:   Family History  Problem Relation Age of Onset   Cancer Maternal Grandmother        breast cancer   Breast cancer Maternal Grandmother        unsure of age   Heart disease Maternal Grandfather    Stroke Brother    Alcohol abuse Brother    Liver disease Brother    Varicose Veins Brother    Heart disease Brother    Cancer Mother    Tracheal cancer Mother    Varicose Veins Father    Heart disease Father    Prostate cancer Father    Heart attack Brother    Mental illness Brother        d. 18   Varicose Veins Paternal Grandmother    Prostate cancer Brother        possible, unsure if BPA vs prostate cancer     Review of Systems: Review of Systems  Constitutional:  Positive for malaise/fatigue. Negative for fever and weight loss.  Respiratory:  Negative for cough, shortness of breath and wheezing.   Cardiovascular:  Negative for chest pain, palpitations and leg swelling.  Gastrointestinal:  Negative for abdominal pain and blood in stool.  Genitourinary:  Negative for dysuria.  Musculoskeletal:  Negative for myalgias.  Skin:  Negative for rash.  Neurological:  Negative for  dizziness and headaches.  Endo/Heme/Allergies:  Does not bruise/bleed easily.       + hot flashes  Psychiatric/Behavioral:  Negative for depression. The patient is not nervous/anxious.      OBJECTIVE Physical Exam: Vitals:   09/24/23 1455 09/24/23 1542  BP: (!) 151/68 (!) 143/56  Pulse: 90 61  Weight: 166 lb 9.6 oz (75.6 kg)   Height: 5' 4.1" (1.628 m)  Physical Exam Constitutional:      General: She is not in acute distress. Pulmonary:     Effort: Pulmonary effort is normal.  Abdominal:     General: There is no distension.     Palpations: Abdomen is soft.     Tenderness: There is no abdominal tenderness. There is no rebound.  Musculoskeletal:        General: No swelling. Normal range of motion.  Skin:    General: Skin is warm and dry.     Findings: No rash.  Neurological:     Mental Status: She is alert and oriented to person, place, and time.  Psychiatric:        Mood and Affect: Mood normal.        Behavior: Behavior normal.      GU / Detailed Urogynecologic Evaluation:  Pelvic Exam: Normal external female genitalia; Bartholin's and Skene's glands normal in appearance; urethral meatus normal in appearance, no urethral masses or discharge.   CST: negative  Speculum exam reveals normal vaginal mucosa with atrophy. Cervix normal appearance. Uterus normal single, nontender. Adnexa no mass, fullness, tenderness.    Pelvic floor strength II/V  Pelvic floor musculature: Right levator non-tender, Right obturator non-tender, Left levator non-tender, Left obturator non-tender  POP-Q:   POP-Q  1                                            Aa   1                                           Ba  -4.5                                              C   4                                            Gh  5                                            Pb  8.5                                            tvl   -2                                            Ap  -2                                             Bp  -6  D      Rectal Exam:  Normal external rectum  Post-Void Residual (PVR) by Bladder Scan: In order to evaluate bladder emptying, we discussed obtaining a postvoid residual and she agreed to this procedure.  Procedure: The ultrasound unit was placed on the patient's abdomen in the suprapubic region after the patient had voided. A PVR of 7 ml was obtained by bladder scan.  Laboratory Results: POC urine: negative   ASSESSMENT AND PLAN Ms. Crusoe is a 62 y.o. with:  1. Uterovaginal prolapse, incomplete   2. Urinary frequency   3. Overactive bladder    Stage II anterior, Stage I posterior, Stage I apical prolapse  - For treatment of pelvic organ prolapse, we discussed options for management including expectant management, conservative management, and surgical management, such as Kegels, a pessary, pelvic floor physical therapy, and specific surgical procedures. - We discussed two options for prolapse repair:  1) vaginal repair without mesh - Pros - safer, no mesh complications - Cons - not as strong as mesh repair, higher risk of recurrence  2) laparoscopic repair with mesh - Pros - stronger, better long-term success - Cons - risks of mesh implant (erosion into vagina or bladder, adhering to the rectum, pain) - these risks are lower than with a vaginal mesh but still exist - She is interested in a stronger procedure as she has failed prior prolapse repair vaginally. Will plan for robotic TLH with BSO, Sacrocolpopexy and perineorrhaphy - Already has a sling and no SUI symptoms so will defer urodynamics  2. OAB - not bothersome to her at this time.   Request sent for surgery scheduling  Marguerita Beards, MD

## 2023-09-25 ENCOUNTER — Encounter: Payer: BC Managed Care – PPO | Admitting: Physical Therapy

## 2023-09-30 ENCOUNTER — Encounter: Payer: Self-pay | Admitting: Obstetrics and Gynecology

## 2023-10-21 ENCOUNTER — Encounter (HOSPITAL_BASED_OUTPATIENT_CLINIC_OR_DEPARTMENT_OTHER): Payer: Self-pay | Admitting: Obstetrics and Gynecology

## 2023-10-21 ENCOUNTER — Other Ambulatory Visit: Payer: Self-pay

## 2023-10-21 ENCOUNTER — Ambulatory Visit (INDEPENDENT_AMBULATORY_CARE_PROVIDER_SITE_OTHER): Payer: BC Managed Care – PPO | Admitting: Obstetrics and Gynecology

## 2023-10-21 ENCOUNTER — Encounter: Payer: Self-pay | Admitting: Obstetrics and Gynecology

## 2023-10-21 VITALS — BP 131/74 | HR 86 | Wt 168.8 lb

## 2023-10-21 DIAGNOSIS — Z01818 Encounter for other preprocedural examination: Secondary | ICD-10-CM | POA: Diagnosis not present

## 2023-10-21 DIAGNOSIS — R9431 Abnormal electrocardiogram [ECG] [EKG]: Secondary | ICD-10-CM | POA: Diagnosis not present

## 2023-10-21 MED ORDER — ACETAMINOPHEN 500 MG PO TABS: 500.0000 mg | ORAL_TABLET | Freq: Four times a day (QID) | ORAL | 0 refills | Status: DC | PRN

## 2023-10-21 MED ORDER — ONDANSETRON HCL 4 MG PO TABS: 4.0000 mg | ORAL_TABLET | Freq: Three times a day (TID) | ORAL | 0 refills | Status: DC | PRN

## 2023-10-21 MED ORDER — POLYETHYLENE GLYCOL 3350 17 GM/SCOOP PO POWD: 17.0000 g | Freq: Every day | ORAL | 0 refills | Status: DC

## 2023-10-21 MED ORDER — IBUPROFEN 600 MG PO TABS: 600.0000 mg | ORAL_TABLET | Freq: Four times a day (QID) | ORAL | 0 refills | Status: DC | PRN

## 2023-10-21 MED ORDER — OXYCODONE HCL 5 MG PO TABS: 5.0000 mg | ORAL_TABLET | ORAL | 0 refills | Status: DC | PRN

## 2023-10-21 NOTE — Progress Notes (Signed)
Spoke w/ via phone for pre-op interview---Rhonda [name] needs dos---- none per anesthesia, surgeon orders pending        [name] results------10/24/23 [name] appt for cbc, bmp, type & screen, ekg COVID test -----patient states asymptomatic no test needed Arrive at -------0930 on Tuesday, 10/28/2023 NPO after MN NO Solid Food.  Clear liquids from MN until---0830 Med rec completed Medications to take morning of surgery -----Amlodipine, Lorazepam prn Diabetic medication -----n/a Patient instructed no nail polish to be worn day of surgery Patient instructed to bring photo id and insurance card day of surgery Patient aware to have Driver (ride ) / caregiver    for 24 hours after surgery - husband, Rhonda [name] Patient Special Instructions -----Extended / overnight stay instructions given. Pre-Op special Instructions -----I did not request orders because patient has a pre-op appt at Dr. Jari Favre office later today. Orders should be put in after that. Patient verbalized understanding of instructions that were given at this phone interview. Patient denies chest pain, sob, fever, cough at the interview.

## 2023-10-21 NOTE — Progress Notes (Signed)
[facility] Pre-Operative Exam  Subjective Chief Complaint: Rhonda [name] presents for a preoperative encounter.   History of Present Illness: Rhonda [name] is a 62 y.o. female who presents for preoperative visit.  She is scheduled to undergo Exam under anesthesia, Robotic assisted total vaginal hysterectomy with bilateral salpingo-oophorectomy, Sacrocolpopexy, perineorrhaphy with cystoscopy on 10/28/23.  Her symptoms include pelvic organ prolapse, and she was was found to have Stage II anterior, Stage I posterior, Stage I apical prolapse.   Urodynamics showed: Deferred  Past Medical History:  Diagnosis Date   Allergy    seasonal   Anxiety    Follows w/ Dr. Margaretann Loveless @ [place].   Cancer [facility]) 02/2019   right breast IDC, Follows w/ [facility].   Family history of breast cancer    Family history of prostate cancer    Female bladder prolapse    Fibroid    Glaucoma    open angle   Hypertension    Dr. Margaretann Loveless at [place].   Leg pain, bilateral    improved   Lung nodules    benign   Other and unspecified hyperlipidemia 04/05/2013   borderline per pt   Peripheral vascular disease (HCC)    chronic venous insufficiency bilaterally   Personal history of radiation therapy    05/24/2019 - 07/08/2019 to right breast   PONV (postoperative nausea and vomiting)    Varicose veins of both lower extremities    Wears glasses    for reading     Past Surgical History:  Procedure Laterality Date   BIOPSY THYROID  2019   FNA of thyroid   BLADDER SUSPENSION  2010   Dr.Silva   BREAST CYST ASPIRATION Right 09/08/2012   BREAST LUMPECTOMY Right 03/19/2019   BREAST LUMPECTOMY WITH RADIOACTIVE SEED AND SENTINEL LYMPH NODE BIOPSY Right 03/22/2019   Procedure: RIGHT BREAST LUMPECTOMY WITH RADIOACTIVE SEED AND RIGHT AXILLARY SENTINEL LYMPH NODE BIOPSY;  Surgeon: Ovidio Kin, MD;  Location: [facility];  Service: General;  Laterality: Right;   COLONOSCOPY  2018   normal    INSERTION OF MESH N/A 11/01/2016   Procedure: INSERTION OF MESH;  Surgeon: Avel Peace, MD;  Location: [facility] OR;  Service: General;  Laterality: N/A;   RHINOPLASTY     in high school   TONSILLECTOMY     VAGINAL PROLAPSE REPAIR  2010   cystocele and rectocele repair   varicose veins     VEIN LIGATION AND STRIPPING  1995   VENTRAL HERNIA REPAIR N/A 11/01/2016   Procedure: VENTRAL HERNIA REPAIR WITH MESH;  Surgeon: Avel Peace, MD;  Location: [facility] OR;  Service: General;  Laterality: N/A;    is allergic to fluconazole.   Family History  Problem Relation Age of Onset   Cancer Maternal Grandmother        breast cancer   Breast cancer Maternal Grandmother        unsure of age   Heart disease Maternal Grandfather    Stroke Brother    Alcohol abuse Brother    Liver disease Brother    Varicose Veins Brother    Heart disease Brother    Cancer Mother    Tracheal cancer Mother    Varicose Veins Father    Heart disease Father    Prostate cancer Father    Heart attack Brother    Mental illness Brother        d. 57   Varicose Veins Paternal Grandmother    Prostate  cancer Brother        possible, unsure if BPA vs prostate cancer    Social History   Tobacco Use   Smoking status: Never   Smokeless tobacco: Never  Vaping Use   Vaping status: Never Used  Substance Use Topics   Alcohol use: Yes    Alcohol/week: 1.0 standard drink of alcohol    Types: 1 Glasses of wine per week   Drug use: Never     Review of Systems was negative for a full 10 system review except as noted in the History of Present Illness.   Current Outpatient Medications:    amLODipine (NORVASC) 5 MG tablet, Take 5 mg by mouth daily., Disp: , Rfl:    anastrozole (ARIMIDEX) 1 MG tablet, TAKE 1 TABLET BY MOUTH EVERY DAY (Patient taking differently: Take 1 mg by mouth at bedtime.), Disp: 90 tablet, Rfl: 2   Ascorbic Acid (VITAMIN C) 1000 MG tablet, Take 1,000 mg by mouth daily., Disp: , Rfl:     lactobacillus acidophilus (BACID) TABS tablet, Take 1 tablet by mouth 3 (three) times a week. Reported on 12/12/2015, Disp: , Rfl:    LORazepam (ATIVAN) 0.5 MG tablet, Take 0.5 mg by mouth every 6 (six) hours as needed. PRN, Disp: , Rfl:    Magnesium 300 MG CAPS, Take 1 capsule by mouth in the morning and at bedtime., Disp: , Rfl:    multivitamin-lutein (OCUVITE-LUTEIN) CAPS, Take 1 capsule by mouth daily., Disp: , Rfl:    OVER THE COUNTER MEDICATION, Vitamin D 3 2000 iu taking in the a.m., Disp: , Rfl:    OVER THE COUNTER MEDICATION, Flaxseed oil 1000 mg taking daily, Disp: , Rfl:    Tafluprost (ZIOPTAN OP), Apply to eye., Disp: , Rfl:    tolnaftate (FORMULA 3 THE TREATMENT) 1 % external solution, , Disp: , Rfl:    Omega-3 Fatty Acids (FISH OIL CONCENTRATE PO), Take by mouth daily at 12 noon. (Patient not taking: Reported on 10/21/2023), Disp: , Rfl:    Objective Vitals:   10/21/23 1302  BP: 131/74  Pulse: 86    Gen: NAD CV: S1 S2 RRR Lungs: Clear to auscultation bilaterally Abd: soft, nontender   Previous Pelvic Exam showed: Normal external female genitalia; Bartholin's and Skene's glands normal in appearance; urethral meatus normal in appearance, no urethral masses or discharge.    CST: negative   Speculum exam reveals normal vaginal mucosa with atrophy. Cervix normal appearance. Uterus normal single, nontender. Adnexa no mass, fullness, tenderness.     Pelvic floor strength II/V   Pelvic floor musculature: Right levator non-tender, Right obturator non-tender, Left levator non-tender, Left obturator non-tender   POP-Q:    POP-Q   1                                            Aa   1                                           Ba   -4.5  C    4                                            Gh   5                                            Pb   8.5                                            tvl    -2                                             Ap   -2                                            Bp   -6                                              D            Assessment/ Plan  Assessment: The patient is a 62 y.o. year old scheduled to undergo Exam under anesthesia, Robotic assisted total vaginal hysterectomy with bilateral salpingo-oophorectomy, Sacrocolpopexy, perineorrhaphy with cystoscopy. Verbal consent was obtained for these procedures.  Plan: General Surgical Consent: The patient has previously been counseled on alternative treatments, and the decision by the patient and provider was to proceed with the procedure listed above.  For all procedures, there are risks of bleeding, infection, damage to surrounding organs including but not limited to bowel, bladder, blood vessels, ureters and nerves, and need for further surgery if an injury were to occur. These risks are all low with minimally invasive surgery.   There are risks of numbness and weakness at any body site or buttock/rectal pain.  It is possible that baseline pain can be worsened by surgery, either with or without mesh. If surgery is vaginal, there is also a low risk of possible conversion to laparoscopy or open abdominal incision where indicated. Very rare risks include blood transfusion, blood clot, heart attack, pneumonia, or death.   There is also a risk of short-term postoperative urinary retention with need to use a catheter. About half of patients need to go home from surgery with a catheter, which is then later removed in the office. The risk of long-term need for a catheter is very low. There is also a risk of worsening of overactive bladder.    Prolapse (with or without mesh): Risk factors for surgical failure  include things that put pressure on your pelvis and the surgical repair, including obesity, chronic cough, and heavy lifting or straining (including lifting children or adults, straining on the toilet, or lifting heavy objects such  as furniture or anything weighing >25 lbs. Risks of recurrence is  20-30% with vaginal native tissue repair and a less than 10% with sacrocolpopexy with mesh.    Sacrocolpopexy: Mesh implants may provide more prolapse support, but do have some unique risks to consider. It is important to understand that mesh is permanent and cannot be easily removed. Risks of abdominal sacrocolpopexy mesh include mesh exposure (~3-6%), painful intercourse (recent studies show lower rates after surgery compared to before, with ~5-8% risk of new onset), and very rare risks of bowel or bladder injury or infection (<1%). The risk of mesh exposure is more likely in a woman with risks for poor healing (prior radiation, poorly controlled diabetes, or immunocompromised). The risk of new or worsened chronic pain after mesh implant is more common in women with baseline chronic pain and/or poorly controlled anxiety or depression. There is an FDA safety notification on vaginal mesh procedures for prolapse but NOT abdominal mesh procedures and therefore does not apply to your surgery. We have extensive experience and training with mesh placement and we have close postoperative follow up to identify any potential complications from mesh.    We discussed consent for blood products. Risks for blood transfusion include allergic reactions, other reactions that can affect different body organs and managed accordingly, transmission of infectious diseases such as HIV or Hepatitis. However, the blood is screened. Patient consents for blood products.  Pre-operative instructions:  She was instructed to not take Aspirin/NSAIDs x 7days prior to surgery. She has been told to stop her fish oil as well. Antibiotic prophylaxis was ordered as indicated.  Catheter use: Patient will go home with foley if needed after post-operative voiding trial.  Post-operative instructions:  She was provided with specific post-operative instructions, including  precautions and signs/symptoms for which we would recommend contacting us, in addition to daytime and after-hours contact phone numbers. This was provided on a handout.   Post-operative medications: Prescriptions for motrin, tylenol, miralax, and oxycodone were sent to her pharmacy. Discussed using ibuprofen and tylenol on a schedule to limit use of narcotics.   Laboratory testing:  We will check labs: CBC and Type and Screen  Preoperative clearance:  She does not require surgical clearance.    Post-operative follow-up:  A post-operative appointment will be made for 6 weeks from the date of surgery. If she needs a post-operative nurse visit for a voiding trial, that will be set up after she leaves the hospital.    Patient will call the clinic or use MyChart should anything change or any new issues arise.   Selmer Dominion, NP

## 2023-10-21 NOTE — H&P (Signed)
[facility] H&P  Subjective Chief Complaint: Rhonda [name] presents for a preoperative encounter.   History of Present Illness: Rhonda [name] is a 62 y.o. female who presents for preoperative visit.  She is scheduled to undergo Exam under anesthesia, Robotic assisted total vaginal hysterectomy with bilateral salpingo-oophorectomy, Sacrocolpopexy, perineorrhaphy with cystoscopy on 10/28/23.  Her symptoms include pelvic organ prolapse, and she was was found to have Stage II anterior, Stage I posterior, Stage I apical prolapse.   Urodynamics showed: Deferred  Past Medical History:  Diagnosis Date   Allergy    seasonal   Anxiety    Follows w/ Dr. Margaretann Loveless @ [facility].   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   Family history of breast cancer    Family history of prostate cancer    Female bladder prolapse    Fibroid    Glaucoma    open angle   Hypertension    Dr. Margaretann Loveless at [facility].   Leg pain, bilateral    improved   Lung nodules    benign   Other and unspecified hyperlipidemia 04/05/2013   borderline per pt   Peripheral vascular disease (HCC)    chronic venous insufficiency bilaterally   Personal history of radiation therapy    05/24/2019 - 07/08/2019 to right breast   PONV (postoperative nausea and vomiting)    Varicose veins of both lower extremities    Wears glasses    for reading     Past Surgical History:  Procedure Laterality Date   BIOPSY THYROID  2019   FNA of thyroid   BLADDER SUSPENSION  2010   Dr.Silva   BREAST CYST ASPIRATION Right 09/08/2012   BREAST LUMPECTOMY Right 03/19/2019   BREAST LUMPECTOMY WITH RADIOACTIVE SEED AND SENTINEL LYMPH NODE BIOPSY Right 03/22/2019   Procedure: RIGHT BREAST LUMPECTOMY WITH RADIOACTIVE SEED AND RIGHT AXILLARY SENTINEL LYMPH NODE BIOPSY;  Surgeon: Ovidio Kin, MD;  Location: [facility];  Service: General;  Laterality: Right;   COLONOSCOPY  2018   normal   INSERTION  OF MESH N/A 11/01/2016   Procedure: INSERTION OF MESH;  Surgeon: Avel Peace, MD;  Location: [facility] OR;  Service: General;  Laterality: N/A;   RHINOPLASTY     in high school   TONSILLECTOMY     VAGINAL PROLAPSE REPAIR  2010   cystocele and rectocele repair   varicose veins     VEIN LIGATION AND STRIPPING  1995   VENTRAL HERNIA REPAIR N/A 11/01/2016   Procedure: VENTRAL HERNIA REPAIR WITH MESH;  Surgeon: Avel Peace, MD;  Location: [facility] OR;  Service: General;  Laterality: N/A;    is allergic to fluconazole.   Family History  Problem Relation Age of Onset   Cancer Maternal Grandmother        breast cancer   Breast cancer Maternal Grandmother        unsure of age   Heart disease Maternal Grandfather    Stroke Brother    Alcohol abuse Brother    Liver disease Brother    Varicose Veins Brother    Heart disease Brother    Cancer Mother    Tracheal cancer Mother    Varicose Veins Father    Heart disease Father    Prostate cancer Father    Heart attack Brother    Mental illness Brother        d. 84   Varicose Veins Paternal Grandmother    Prostate cancer  Brother        possible, unsure if BPA vs prostate cancer    Social History   Tobacco Use   Smoking status: Never   Smokeless tobacco: Never  Vaping Use   Vaping status: Never Used  Substance Use Topics   Alcohol use: Yes    Alcohol/week: 1.0 standard drink of alcohol    Types: 1 Glasses of wine per week   Drug use: Never     Review of Systems was negative for a full 10 system review except as noted in the History of Present Illness.  No current facility-administered medications for this encounter.  Current Outpatient Medications:    Omega-3 Fatty Acids (FISH OIL CONCENTRATE PO), Take by mouth daily at 12 noon. (Patient not taking: Reported on 10/21/2023), Disp: , Rfl:    acetaminophen (TYLENOL) 500 MG tablet, Take 1 tablet (500 mg total) by mouth every 6 (six) hours as needed (pain)., Disp: 30 tablet, Rfl: 0    amLODipine (NORVASC) 5 MG tablet, Take 5 mg by mouth daily., Disp: , Rfl:    anastrozole (ARIMIDEX) 1 MG tablet, TAKE 1 TABLET BY MOUTH EVERY DAY (Patient taking differently: Take 1 mg by mouth at bedtime.), Disp: 90 tablet, Rfl: 2   Ascorbic Acid (VITAMIN C) 1000 MG tablet, Take 1,000 mg by mouth daily., Disp: , Rfl:    ibuprofen (ADVIL) 600 MG tablet, Take 1 tablet (600 mg total) by mouth every 6 (six) hours as needed., Disp: 30 tablet, Rfl: 0   lactobacillus acidophilus (BACID) TABS tablet, Take 1 tablet by mouth 3 (three) times a week. Reported on 12/12/2015, Disp: , Rfl:    LORazepam (ATIVAN) 0.5 MG tablet, Take 0.5 mg by mouth every 6 (six) hours as needed. PRN, Disp: , Rfl:    Magnesium 300 MG CAPS, Take 1 capsule by mouth in the morning and at bedtime., Disp: , Rfl:    multivitamin-lutein (OCUVITE-LUTEIN) CAPS, Take 1 capsule by mouth daily., Disp: , Rfl:    ondansetron (ZOFRAN) 4 MG tablet, Take 1 tablet (4 mg total) by mouth every 8 (eight) hours as needed for nausea or vomiting., Disp: 15 tablet, Rfl: 0   OVER THE COUNTER MEDICATION, Vitamin D 3 2000 iu taking in the a.m., Disp: , Rfl:    OVER THE COUNTER MEDICATION, Flaxseed oil 1000 mg taking daily, Disp: , Rfl:    oxyCODONE (OXY IR/ROXICODONE) 5 MG immediate release tablet, Take 1 tablet (5 mg total) by mouth every 4 (four) hours as needed for severe pain (pain score 7-10)., Disp: 10 tablet, Rfl: 0   polyethylene glycol powder (GLYCOLAX/MIRALAX) 17 GM/SCOOP powder, Take 17 g by mouth daily. Drink 17g (1 scoop) dissolved in water per day., Disp: 255 g, Rfl: 0   Tafluprost (ZIOPTAN OP), Apply to eye., Disp: , Rfl:    tolnaftate (FORMULA 3 THE TREATMENT) 1 % external solution, , Disp: , Rfl:    Objective There were no vitals filed for this visit.   Gen: NAD CV: S1 S2 RRR Lungs: Clear to auscultation bilaterally Abd: soft, nontender  Caprini Score: 8  Previous Pelvic Exam showed: Normal external female genitalia; Bartholin's and  Skene's glands normal in appearance; urethral meatus normal in appearance, no urethral masses or discharge.    CST: negative   Speculum exam reveals normal vaginal mucosa with atrophy. Cervix normal appearance. Uterus normal single, nontender. Adnexa no mass, fullness, tenderness.     Pelvic floor strength II/V   Pelvic floor musculature: Right levator non-tender, Right  obturator non-tender, Left levator non-tender, Left obturator non-tender   POP-Q:    POP-Q   1                                            Aa   1                                           Ba   -4.5                                              C    4                                            Gh   5                                            Pb   8.5                                            tvl    -2                                            Ap   -2                                            Bp   -6                                              D            Assessment/ Plan  Assessment: The patient is a 62 y.o. year old scheduled to undergo Exam under anesthesia, Robotic assisted total vaginal hysterectomy with bilateral salpingo-oophorectomy, Sacrocolpopexy, perineorrhaphy with cystoscopy. Verbal consent was obtained for these procedures.

## 2023-10-21 NOTE — Progress Notes (Signed)
Your procedure is scheduled on Tuesday, 10/28/2023.  Report to [facility] AT  9:30 AM.   Call this number if you have problems the morning of surgery  :[phone].   OUR ADDRESS IS [address].  WE ARE LOCATED IN [facility].  PLEASE BRING YOUR INSURANCE CARD AND PHOTO ID DAY OF SURGERY.  ONLY 2 PEOPLE ARE ALLOWED IN  WAITING  ROOM                                      REMEMBER:  DO NOT EAT FOOD, CANDY GUM OR MINTS  AFTER MIDNIGHT THE NIGHT BEFORE YOUR SURGERY . YOU MAY HAVE CLEAR LIQUIDS FROM MIDNIGHT THE NIGHT BEFORE YOUR SURGERY UNTIL  8:30 AM. NO CLEAR LIQUIDS AFTER   8:30 AM DAY OF SURGERY.  YOU MAY  BRUSH YOUR TEETH MORNING OF SURGERY AND RINSE YOUR MOUTH OUT, NO CHEWING GUM CANDY OR MINTS.     CLEAR LIQUID DIET    Allowed      Water                                                                   Coffee and tea, regular and decaf  (NO cream or milk products of any type, may sweeten)                         Carbonated beverages, regular and diet                                    Sports drinks like Gatorade _____________________________________________________________________     TAKE ONLY THESE MEDICATIONS MORNING OF SURGERY: Amlodipine, Ativan if needed                                        DO NOT WEAR JEWERLY/  METAL/  PIERCINGS (INCLUDING NO PLASTIC PIERCINGS) DO NOT WEAR LOTIONS, POWDERS, PERFUMES OR NAIL POLISH ON YOUR FINGERNAILS. TOENAIL POLISH IS OK TO WEAR. DO NOT SHAVE FOR 48 HOURS PRIOR TO DAY OF SURGERY.  CONTACTS, GLASSES, OR DENTURES MAY NOT BE WORN TO SURGERY.  REMEMBER: NO SMOKING, VAPING ,  DRUGS OR ALCOHOL FOR 24 HOURS BEFORE YOUR SURGERY.                                    [facility] IS NOT RESPONSIBLE  FOR ANY BELONGINGS.                                                                    Marland Kitchen           [facility] - Preparing for  Surgery Before surgery, you can play an important role.  Because  skin is not sterile, your skin needs to be as free of germs as possible.  You can reduce the number of germs on your skin by washing with CHG (chlorahexidine gluconate) soap before surgery.  CHG is an antiseptic cleaner which kills germs and bonds with the skin to continue killing germs even after washing. Please DO NOT use if you have an allergy to CHG or antibacterial soaps.  If your skin becomes reddened/irritated stop using the CHG and inform your nurse when you arrive at Short Stay. Do not shave (including legs and underarms) for at least 48 hours prior to the first CHG shower.  You may shave your face/neck. Please follow these instructions carefully:  1.  Shower with CHG Soap the night before surgery and the  morning of Surgery.  2.  If you choose to wash your hair, wash your hair first as usual with your  normal  shampoo.  3.  After you shampoo, rinse your hair and body thoroughly to remove the  shampoo.                                        4.  Use CHG as you would any other liquid soap.  You can apply chg directly  to the skin and wash , chg soap provided, night before and morning of your surgery.  5.  Apply the CHG Soap to your body ONLY FROM THE NECK DOWN.   Do not use on face/ open                           Wound or open sores. Avoid contact with eyes, ears mouth and genitals (private parts).                       Wash face,  Genitals (private parts) with your normal soap.             6.  Wash thoroughly, paying special attention to the area where your surgery  will be performed.  7.  Thoroughly rinse your body with warm water from the neck down.  8.  DO NOT shower/wash with your normal soap after using and rinsing off  the CHG Soap.             9.  Pat yourself dry with a clean towel.            10.  Wear clean pajamas.            11.  Place clean sheets on your bed the night of your first shower and do not  sleep with pets. Day of Surgery : Do not apply any lotions/ powders the  morning of surgery.  Please wear clean clothes to the hospital/surgery center.  IF YOU HAVE ANY SKIN IRRITATION OR PROBLEMS WITH THE SURGICAL SOAP, PLEASE GET A BAR OF GOLD DIAL SOAP AND SHOWER THE NIGHT BEFORE YOUR SURGERY AND THE MORNING OF YOUR SURGERY. PLEASE LET THE NURSE KNOW MORNING OF YOUR SURGERY IF YOU HAD ANY PROBLEMS WITH THE SURGICAL SOAP.   YOUR SURGEON MAY HAVE REQUESTED EXTENDED RECOVERY TIME AFTER YOUR SURGERY. IT COULD BE A  JUST A FEW HOURS  UP TO AN OVERNIGHT STAY.  YOUR SURGEON SHOULD HAVE DISCUSSED THIS WITH YOU PRIOR  TO YOUR SURGERY. IN THE EVENT YOU NEED TO STAY OVERNIGHT PLEASE REFER TO THE FOLLOWING GUIDELINES. YOU MAY HAVE UP TO 4 VISITORS  MAY VISIT IN THE EXTENDED RECOVERY ROOM UNTIL 800 PM ONLY.  ONE  VISITOR AGE 64 AND OVER MAY SPEND THE NIGHT AND MUST BE IN EXTENDED RECOVERY ROOM NO LATER THAN 800 PM . YOUR DISCHARGE TIME AFTER YOU SPEND THE NIGHT IS 900 AM THE MORNING AFTER YOUR SURGERY. YOU MAY PACK A SMALL OVERNIGHT BAG WITH TOILETRIES FOR YOUR OVERNIGHT STAY IF YOU WISH.  REGARDLESS OF IF YOU STAY OVER NIGHT OR ARE DISCHARGED THE SAME DAY YOU WILL BE REQUIRED TO HAVE A RESPONSIBLE ADULT (18 YRS OLD OR OLDER) STAY WITH YOU FOR AT LEAST THE FIRST 24 HOURS  YOUR PRESCRIPTION MEDICATIONS WILL BE PROVIDED DURING YOUR HOSPITAL STAY.  ________________________________________________________________________                                                        QUESTIONS Mechele Claude PRE OP NURSE PHONE [phone].

## 2023-10-23 ENCOUNTER — Encounter: Payer: Self-pay | Admitting: Obstetrics and Gynecology

## 2023-10-23 ENCOUNTER — Encounter: Payer: Self-pay | Admitting: Hematology and Oncology

## 2023-10-24 ENCOUNTER — Encounter (HOSPITAL_COMMUNITY)
Admission: RE | Admit: 2023-10-24 | Discharge: 2023-10-24 | Disposition: A | Discharge status: Home / Self Care | Payer: BC Managed Care – PPO | Source: Ambulatory Visit | Attending: Obstetrics and Gynecology

## 2023-10-24 DIAGNOSIS — Z01818 Encounter for other preprocedural examination: Secondary | ICD-10-CM | POA: Insufficient documentation

## 2023-10-24 DIAGNOSIS — R9431 Abnormal electrocardiogram [ECG] [EKG]: Secondary | ICD-10-CM | POA: Diagnosis not present

## 2023-10-24 LAB — CBC
HCT: 47.1 % — ABNORMAL HIGH (ref 36.0–46.0)
Hemoglobin: 15.6 g/dL — ABNORMAL HIGH (ref 12.0–15.0)
MCH: 30.4 pg (ref 26.0–34.0)
MCHC: 33.1 g/dL (ref 30.0–36.0)
MCV: 91.8 fL (ref 80.0–100.0)
Platelets: 235 10*3/uL (ref 150–400)
RBC: 5.13 MIL/uL — ABNORMAL HIGH (ref 3.87–5.11)
RDW: 11.9 % (ref 11.5–15.5)
WBC: 6.5 10*3/uL (ref 4.0–10.5)
nRBC: 0 % (ref 0.0–0.2)

## 2023-10-24 LAB — BASIC METABOLIC PANEL
Anion gap: 9 (ref 5–15)
BUN: 19 mg/dL (ref 8–23)
CO2: 26 mmol/L (ref 22–32)
Calcium: 9.2 mg/dL (ref 8.9–10.3)
Chloride: 104 mmol/L (ref 98–111)
Creatinine, Ser: 0.72 mg/dL (ref 0.44–1.00)
GFR, Estimated: >60 mL/min (ref >60)
Glucose, Bld: 99 mg/dL (ref 70–99)
Potassium: 3.9 mmol/L (ref 3.5–5.1)
Sodium: 139 mmol/L (ref 135–145)

## 2023-10-27 NOTE — Anesthesia Preprocedure Evaluation (Signed)
Anesthesia Evaluation  Patient identified by MRN, date of birth, ID band Patient awake    Reviewed: Allergy & Precautions, NPO status , Patient's Chart, lab work & pertinent test results  History of Anesthesia Complications (+) PONV and history of anesthetic complications (but did well w/ last surgery (lumpectomy) and had inhalational anesthesia at that time)  Airway Mallampati: II  TM Distance: >3 FB Neck ROM: Full    Dental  (+) Teeth Intact, Dental Advisory Given   Pulmonary neg pulmonary ROS   Pulmonary exam normal breath sounds clear to auscultation       Cardiovascular hypertension (133/82 preop), Pt. on medications + Peripheral Vascular Disease  Normal cardiovascular exam Rhythm:Regular Rate:Normal     Neuro/Psych  PSYCHIATRIC DISORDERS Anxiety     negative neurological ROS     GI/Hepatic negative GI ROS, Neg liver ROS,,,  Endo/Other  negative endocrine ROS    Renal/GU negative Renal ROS  negative genitourinary   Musculoskeletal negative musculoskeletal ROS (+)    Abdominal   Peds  Hematology negative hematology ROS (+) Hb 15.6   Anesthesia Other Findings Hx R breast ca- restricted arm  Reproductive/Obstetrics                             Anesthesia Physical Anesthesia Plan  ASA: 2  Anesthesia Plan: General   Post-op Pain Management: Tylenol PO (pre-op)*, Toradol IV (intra-op)* and Ketamine IV*   Induction: Intravenous  PONV Risk Score and Plan: 4 or greater and Ondansetron, Dexamethasone, Midazolam, Treatment may vary due to age or medical condition, Propofol infusion and Aprepitant  Airway Management Planned: Oral ETT  Additional Equipment: None  Intra-op Plan:   Post-operative Plan: Extubation in OR  Informed Consent: I have reviewed the patients History and Physical, chart, labs and discussed the procedure including the risks, benefits and alternatives for the  proposed anesthesia with the patient or authorized representative who has indicated his/her understanding and acceptance.     Dental advisory given  Plan Discussed with: CRNA  Anesthesia Plan Comments:        Anesthesia Quick Evaluation

## 2023-10-28 ENCOUNTER — Encounter (HOSPITAL_BASED_OUTPATIENT_CLINIC_OR_DEPARTMENT_OTHER): Payer: Self-pay | Admitting: Obstetrics and Gynecology

## 2023-10-28 ENCOUNTER — Ambulatory Visit (HOSPITAL_BASED_OUTPATIENT_CLINIC_OR_DEPARTMENT_OTHER): Payer: BC Managed Care – PPO | Admitting: Anesthesiology

## 2023-10-28 ENCOUNTER — Other Ambulatory Visit: Payer: Self-pay

## 2023-10-28 ENCOUNTER — Ambulatory Visit (HOSPITAL_BASED_OUTPATIENT_CLINIC_OR_DEPARTMENT_OTHER)
Admission: RE | Admit: 2023-10-28 | Discharge: 2023-10-29 | Disposition: A | Discharge status: Home / Self Care | Payer: BC Managed Care – PPO | Attending: Obstetrics and Gynecology | Admitting: Obstetrics and Gynecology

## 2023-10-28 ENCOUNTER — Encounter (HOSPITAL_BASED_OUTPATIENT_CLINIC_OR_DEPARTMENT_OTHER)
Admission: RE | Disposition: A | Discharge status: Home / Self Care | Payer: Self-pay | Source: Home / Self Care | Attending: Obstetrics and Gynecology

## 2023-10-28 DIAGNOSIS — N858 Other specified noninflammatory disorders of uterus: Secondary | ICD-10-CM | POA: Diagnosis not present

## 2023-10-28 DIAGNOSIS — Z01818 Encounter for other preprocedural examination: Secondary | ICD-10-CM

## 2023-10-28 DIAGNOSIS — I10 Essential (primary) hypertension: Secondary | ICD-10-CM | POA: Insufficient documentation

## 2023-10-28 DIAGNOSIS — Z79899 Other long term (current) drug therapy: Secondary | ICD-10-CM | POA: Diagnosis not present

## 2023-10-28 DIAGNOSIS — Z8249 Family history of ischemic heart disease and other diseases of the circulatory system: Secondary | ICD-10-CM | POA: Diagnosis not present

## 2023-10-28 DIAGNOSIS — N855 Inversion of uterus: Secondary | ICD-10-CM | POA: Diagnosis not present

## 2023-10-28 DIAGNOSIS — N888 Other specified noninflammatory disorders of cervix uteri: Secondary | ICD-10-CM | POA: Diagnosis not present

## 2023-10-28 DIAGNOSIS — N812 Incomplete uterovaginal prolapse: Secondary | ICD-10-CM | POA: Diagnosis not present

## 2023-10-28 DIAGNOSIS — N814 Uterovaginal prolapse, unspecified: Secondary | ICD-10-CM | POA: Diagnosis not present

## 2023-10-28 DIAGNOSIS — N8003 Adenomyosis of the uterus: Secondary | ICD-10-CM | POA: Diagnosis not present

## 2023-10-28 DIAGNOSIS — F419 Anxiety disorder, unspecified: Secondary | ICD-10-CM | POA: Diagnosis not present

## 2023-10-28 DIAGNOSIS — I739 Peripheral vascular disease, unspecified: Secondary | ICD-10-CM | POA: Insufficient documentation

## 2023-10-28 HISTORY — PX: PERINEOPLASTY: SHX2218

## 2023-10-28 HISTORY — PX: CYSTOSCOPY: SHX5120

## 2023-10-28 HISTORY — DX: Essential (primary) hypertension: I10

## 2023-10-28 HISTORY — PX: XI ROBOTIC ASSISTED TOTAL HYSTERECTOMY WITH SACROCOLPOPEXY: SHX6825

## 2023-10-28 HISTORY — DX: Cystocele, unspecified: N81.10

## 2023-10-28 HISTORY — DX: Presence of spectacles and contact lenses: Z97.3

## 2023-10-28 LAB — TYPE AND SCREEN
ABO/RH(D): O POS
Antibody Screen: NEGATIVE

## 2023-10-28 LAB — ABO/RH: ABO/RH(D): O POS

## 2023-10-28 SURGERY — HYSTERECTOMY, TOTAL, ROBOT-ASSISTED, WITH SACROCOLPOPEXY
Anesthesia: General | Site: Vagina

## 2023-10-28 MED ORDER — PHENAZOPYRIDINE HCL 100 MG PO TABS
ORAL_TABLET | ORAL | Status: AC
  Filled 2023-10-28: qty 2

## 2023-10-28 MED ORDER — FENTANYL CITRATE (PF) 100 MCG/2ML IJ SOLN
INTRAMUSCULAR | Status: DC | PRN
  Administered 2023-10-28: 50 ug via INTRAVENOUS
  Administered 2023-10-28: 100 ug via INTRAVENOUS
  Administered 2023-10-28: 50 ug via INTRAVENOUS

## 2023-10-28 MED ORDER — PROPOFOL 1000 MG/100ML IV EMUL
INTRAVENOUS | Status: AC
  Filled 2023-10-28: qty 100

## 2023-10-28 MED ORDER — LIDOCAINE-EPINEPHRINE 1 %-1:100000 IJ SOLN
INTRAMUSCULAR | Status: DC | PRN
  Administered 2023-10-28: 2 mL

## 2023-10-28 MED ORDER — GLYCOPYRROLATE 0.2 MG/ML IJ SOLN
INTRAMUSCULAR | Status: DC | PRN
  Administered 2023-10-28: .2 mg via INTRAVENOUS

## 2023-10-28 MED ORDER — ONDANSETRON HCL 4 MG/2ML IJ SOLN
4.0000 mg | Freq: Four times a day (QID) | INTRAMUSCULAR | Status: DC | PRN
  Administered 2023-10-28: 4 mg via INTRAVENOUS

## 2023-10-28 MED ORDER — LACTATED RINGERS IV SOLN
INTRAVENOUS | Status: DC
Start: 2023-10-28 — End: 2023-10-29

## 2023-10-28 MED ORDER — IBUPROFEN 200 MG PO TABS
600.0000 mg | ORAL_TABLET | Freq: Four times a day (QID) | ORAL | Status: DC
  Administered 2023-10-28 – 2023-10-29 (×3): 600 mg via ORAL

## 2023-10-28 MED ORDER — SUGAMMADEX SODIUM 200 MG/2ML IV SOLN
INTRAVENOUS | Status: DC | PRN
  Administered 2023-10-28: 200 mg via INTRAVENOUS

## 2023-10-28 MED ORDER — PHENYLEPHRINE HCL (PRESSORS) 10 MG/ML IV SOLN
INTRAVENOUS | Status: DC | PRN
  Administered 2023-10-28: 80 ug via INTRAVENOUS
  Administered 2023-10-28: 160 ug via INTRAVENOUS
  Administered 2023-10-28 (×4): 80 ug via INTRAVENOUS

## 2023-10-28 MED ORDER — ONDANSETRON HCL 4 MG PO TABS: 4.0000 mg | ORAL_TABLET | Freq: Four times a day (QID) | ORAL | Status: DC | PRN

## 2023-10-28 MED ORDER — KETOROLAC TROMETHAMINE 30 MG/ML IJ SOLN
INTRAMUSCULAR | Status: DC | PRN
  Administered 2023-10-28: 30 mg via INTRAVENOUS

## 2023-10-28 MED ORDER — PROPOFOL 10 MG/ML IV BOLUS
INTRAVENOUS | Status: DC | PRN
  Administered 2023-10-28: 200 mg via INTRAVENOUS

## 2023-10-28 MED ORDER — CEFAZOLIN SODIUM-DEXTROSE 2-4 GM/100ML-% IV SOLN
2.0000 g | INTRAVENOUS | Status: AC
  Administered 2023-10-28: 2 g via INTRAVENOUS

## 2023-10-28 MED ORDER — DEXAMETHASONE SODIUM PHOSPHATE 4 MG/ML IJ SOLN
INTRAMUSCULAR | Status: DC | PRN
  Administered 2023-10-28: 5 mg via INTRAVENOUS

## 2023-10-28 MED ORDER — ONDANSETRON HCL 4 MG/2ML IJ SOLN
INTRAMUSCULAR | Status: AC
Start: 2023-10-28 — End: ?
  Filled 2023-10-28: qty 2

## 2023-10-28 MED ORDER — LIDOCAINE HCL (PF) 2 % IJ SOLN
INTRAMUSCULAR | Status: AC
  Filled 2023-10-28: qty 5

## 2023-10-28 MED ORDER — GABAPENTIN 300 MG PO CAPS
ORAL_CAPSULE | ORAL | Status: AC
  Filled 2023-10-28: qty 1

## 2023-10-28 MED ORDER — OXYCODONE HCL 5 MG/5ML PO SOLN: 5.0000 mg | Freq: Once | ORAL | Status: DC | PRN

## 2023-10-28 MED ORDER — ACETAMINOPHEN 500 MG PO TABS
ORAL_TABLET | ORAL | Status: AC
  Filled 2023-10-28: qty 2

## 2023-10-28 MED ORDER — PROPOFOL 500 MG/50ML IV EMUL
INTRAVENOUS | Status: DC | PRN
  Administered 2023-10-28: 100 ug/kg/min via INTRAVENOUS

## 2023-10-28 MED ORDER — FENTANYL CITRATE (PF) 250 MCG/5ML IJ SOLN
INTRAMUSCULAR | Status: AC
  Filled 2023-10-28: qty 5

## 2023-10-28 MED ORDER — PHENAZOPYRIDINE HCL 100 MG PO TABS
200.0000 mg | ORAL_TABLET | ORAL | Status: AC
  Administered 2023-10-28: 200 mg via ORAL

## 2023-10-28 MED ORDER — ACETAMINOPHEN 500 MG PO TABS
1000.0000 mg | ORAL_TABLET | ORAL | Status: AC
  Administered 2023-10-28: 1000 mg via ORAL

## 2023-10-28 MED ORDER — PROPOFOL 10 MG/ML IV BOLUS
INTRAVENOUS | Status: AC
Start: 2023-10-28 — End: ?
  Filled 2023-10-28: qty 20

## 2023-10-28 MED ORDER — HEMOSTATIC AGENTS (NO CHARGE) OPTIME
TOPICAL | Status: DC | PRN
  Administered 2023-10-28: 1 via TOPICAL

## 2023-10-28 MED ORDER — ANASTROZOLE 1 MG PO TABS
1.0000 mg | ORAL_TABLET | Freq: Every day | ORAL | Status: DC
  Filled 2023-10-28: qty 1

## 2023-10-28 MED ORDER — ROCURONIUM BROMIDE 100 MG/10ML IV SOLN
INTRAVENOUS | Status: DC | PRN
  Administered 2023-10-28 (×3): 20 mg via INTRAVENOUS
  Administered 2023-10-28: 80 mg via INTRAVENOUS

## 2023-10-28 MED ORDER — ACETAMINOPHEN 325 MG PO TABS
650.0000 mg | ORAL_TABLET | ORAL | Status: DC | PRN
  Administered 2023-10-28 – 2023-10-29 (×2): 650 mg via ORAL

## 2023-10-28 MED ORDER — ONDANSETRON HCL 4 MG/2ML IJ SOLN
INTRAMUSCULAR | Status: AC
  Filled 2023-10-28: qty 2

## 2023-10-28 MED ORDER — IBUPROFEN 200 MG PO TABS
ORAL_TABLET | ORAL | Status: AC
  Filled 2023-10-28: qty 3

## 2023-10-28 MED ORDER — DEXAMETHASONE SODIUM PHOSPHATE 10 MG/ML IJ SOLN
INTRAMUSCULAR | Status: AC
  Filled 2023-10-28: qty 1

## 2023-10-28 MED ORDER — ROCURONIUM BROMIDE 10 MG/ML (PF) SYRINGE
PREFILLED_SYRINGE | INTRAVENOUS | Status: AC
  Filled 2023-10-28: qty 10

## 2023-10-28 MED ORDER — GABAPENTIN 300 MG PO CAPS
300.0000 mg | ORAL_CAPSULE | ORAL | Status: AC
  Administered 2023-10-28: 300 mg via ORAL

## 2023-10-28 MED ORDER — OXYCODONE HCL 5 MG PO TABS
ORAL_TABLET | ORAL | Status: AC
  Filled 2023-10-28: qty 1

## 2023-10-28 MED ORDER — EPHEDRINE SULFATE (PRESSORS) 50 MG/ML IJ SOLN
INTRAMUSCULAR | Status: DC | PRN
  Administered 2023-10-28 (×3): 10 mg via INTRAVENOUS
  Administered 2023-10-28: 5 mg via INTRAVENOUS
  Administered 2023-10-28: 10 mg via INTRAVENOUS

## 2023-10-28 MED ORDER — EPHEDRINE 5 MG/ML INJ
INTRAVENOUS | Status: AC
  Filled 2023-10-28: qty 5

## 2023-10-28 MED ORDER — SIMETHICONE 80 MG PO CHEW: 80.0000 mg | CHEWABLE_TABLET | Freq: Four times a day (QID) | ORAL | Status: DC | PRN

## 2023-10-28 MED ORDER — MIDAZOLAM HCL 5 MG/5ML IJ SOLN
INTRAMUSCULAR | Status: DC | PRN
  Administered 2023-10-28: 2 mg via INTRAVENOUS

## 2023-10-28 MED ORDER — AMISULPRIDE (ANTIEMETIC) 5 MG/2ML IV SOLN: 10.0000 mg | Freq: Once | INTRAVENOUS | Status: DC | PRN

## 2023-10-28 MED ORDER — APREPITANT 40 MG PO CAPS: 40.0000 mg | ORAL_CAPSULE | Freq: Once | ORAL | Status: DC

## 2023-10-28 MED ORDER — ACETAMINOPHEN 325 MG PO TABS
ORAL_TABLET | ORAL | Status: AC
  Filled 2023-10-28: qty 2

## 2023-10-28 MED ORDER — LIDOCAINE HCL (CARDIAC) PF 100 MG/5ML IV SOSY
PREFILLED_SYRINGE | INTRAVENOUS | Status: DC | PRN
  Administered 2023-10-28: 60 mg via INTRAVENOUS

## 2023-10-28 MED ORDER — SODIUM CHLORIDE 0.9 % IR SOLN
Status: DC | PRN
  Administered 2023-10-28: 250 mL

## 2023-10-28 MED ORDER — PHENYLEPHRINE 80 MCG/ML (10ML) SYRINGE FOR IV PUSH (FOR BLOOD PRESSURE SUPPORT)
PREFILLED_SYRINGE | INTRAVENOUS | Status: AC
  Filled 2023-10-28: qty 10

## 2023-10-28 MED ORDER — ENOXAPARIN SODIUM 40 MG/0.4ML IJ SOSY
40.0000 mg | PREFILLED_SYRINGE | INTRAMUSCULAR | Status: AC
  Administered 2023-10-28: 40 mg via SUBCUTANEOUS

## 2023-10-28 MED ORDER — ANASTROZOLE 1 MG PO TABS
1.0000 mg | ORAL_TABLET | Freq: Every day | ORAL | Status: AC
  Administered 2023-10-28: 1 mg via ORAL
  Filled 2023-10-28: qty 1

## 2023-10-28 MED ORDER — ONDANSETRON HCL 4 MG/2ML IJ SOLN: 4.0000 mg | Freq: Once | INTRAMUSCULAR | Status: DC | PRN

## 2023-10-28 MED ORDER — HYDROMORPHONE HCL 1 MG/ML IJ SOLN
INTRAMUSCULAR | Status: AC
  Filled 2023-10-28: qty 1

## 2023-10-28 MED ORDER — OXYCODONE HCL 5 MG PO TABS: 5.0000 mg | ORAL_TABLET | Freq: Once | ORAL | Status: DC | PRN

## 2023-10-28 MED ORDER — GLYCOPYRROLATE PF 0.2 MG/ML IJ SOSY
PREFILLED_SYRINGE | INTRAMUSCULAR | Status: AC
  Filled 2023-10-28: qty 1

## 2023-10-28 MED ORDER — STERILE WATER FOR IRRIGATION IR SOLN
Status: DC | PRN
  Administered 2023-10-28: 500 mL

## 2023-10-28 MED ORDER — CEFAZOLIN SODIUM-DEXTROSE 2-4 GM/100ML-% IV SOLN
INTRAVENOUS | Status: AC
  Filled 2023-10-28: qty 100

## 2023-10-28 MED ORDER — HYDROMORPHONE HCL 1 MG/ML IJ SOLN
0.2500 mg | INTRAMUSCULAR | Status: DC | PRN
  Administered 2023-10-28: 0.5 mg via INTRAVENOUS

## 2023-10-28 MED ORDER — ONDANSETRON HCL 4 MG/2ML IJ SOLN
INTRAMUSCULAR | Status: DC | PRN
  Administered 2023-10-28: 4 mg via INTRAVENOUS

## 2023-10-28 MED ORDER — KETOROLAC TROMETHAMINE 30 MG/ML IJ SOLN: 15.0000 mg | Freq: Once | INTRAMUSCULAR | Status: AC | PRN

## 2023-10-28 MED ORDER — ENOXAPARIN SODIUM 40 MG/0.4ML IJ SOSY
PREFILLED_SYRINGE | INTRAMUSCULAR | Status: AC
  Filled 2023-10-28: qty 0.4

## 2023-10-28 MED ORDER — BUPIVACAINE HCL (PF) 0.25 % IJ SOLN
INTRAMUSCULAR | Status: DC | PRN
  Administered 2023-10-28: 23 mL

## 2023-10-28 MED ORDER — MIDAZOLAM HCL 2 MG/2ML IJ SOLN
INTRAMUSCULAR | Status: AC
  Filled 2023-10-28: qty 2

## 2023-10-28 MED ORDER — OXYCODONE HCL 5 MG PO TABS
5.0000 mg | ORAL_TABLET | ORAL | Status: DC | PRN
  Administered 2023-10-28: 5 mg via ORAL

## 2023-10-28 SURGICAL SUPPLY — 83 items
APPLICATOR ARISTA FLEXITIP XL (MISCELLANEOUS) IMPLANT
BLADE SURG 15 STRL LF DISP TIS (BLADE) ×3 IMPLANT
BLADE SURG 15 STRL SS (BLADE) ×3
CATH FOLEY 3WAY 5CC 16FR (CATHETERS) ×3 IMPLANT
CHLORAPREP W/TINT 26 (MISCELLANEOUS) ×3 IMPLANT
COVER BACK TABLE 60X90IN (DRAPES) ×3 IMPLANT
COVER TIP SHEARS 8 DVNC (MISCELLANEOUS) ×3 IMPLANT
DEFOGGER SCOPE WARMER CLEARIFY (MISCELLANEOUS) ×3 IMPLANT
DERMABOND ADVANCED .7 DNX12 (GAUZE/BANDAGES/DRESSINGS) ×3 IMPLANT
DRAPE ARM DVNC X/XI (DISPOSABLE) ×12 IMPLANT
DRAPE COLUMN DVNC XI (DISPOSABLE) ×3 IMPLANT
DRAPE SHEET LG 3/4 BI-LAMINATE (DRAPES) ×3 IMPLANT
DRAPE SURG IRRIG POUCH 19X23 (DRAPES) ×3 IMPLANT
DRAPE UTILITY XL STRL (DRAPES) ×3 IMPLANT
DRIVER NDL LRG 8 DVNC XI (INSTRUMENTS) ×3 IMPLANT
DRIVER NDL MEGA SUTCUT DVNCXI (INSTRUMENTS) ×3 IMPLANT
DRIVER NDLE LRG 8 DVNC XI (INSTRUMENTS) ×3
DRIVER NDLE MEGA SUTCUT DVNCXI (INSTRUMENTS) ×3
ELECT REM PT RETURN 9FT ADLT (ELECTROSURGICAL) ×3
ELECTRODE REM PT RTRN 9FT ADLT (ELECTROSURGICAL) ×3 IMPLANT
FORCEPS BPLR 8 MD DVNC XI (FORCEP) ×3 IMPLANT
GAUZE 4X4 16PLY ~~LOC~~+RFID DBL (SPONGE) ×3 IMPLANT
GLOVE BIO SURGEON STRL SZ7 (GLOVE) IMPLANT
GLOVE BIOGEL PI IND STRL 6 (GLOVE) IMPLANT
GLOVE BIOGEL PI IND STRL 6.5 (GLOVE) ×12 IMPLANT
GLOVE BIOGEL PI IND STRL 7.0 (GLOVE) ×3 IMPLANT
GLOVE ECLIPSE 6.0 STRL STRAW (GLOVE) ×9 IMPLANT
GLOVE ECLIPSE 7.0 STRL STRAW (GLOVE) IMPLANT
GLOVE SURG SYN 6.5 ES PF (GLOVE) ×6
GLOVE SURG SYN 6.5 PF PI (GLOVE) IMPLANT
GOWN STRL REUS W/ TWL LRG LVL3 (GOWN DISPOSABLE) IMPLANT
GOWN STRL REUS W/TWL LRG LVL3 (GOWN DISPOSABLE) ×9 IMPLANT
GRASPER TIP-UP FEN DVNC XI (INSTRUMENTS) ×3 IMPLANT
HEMOSTAT ARISTA ABSORB 3G PWDR (HEMOSTASIS) IMPLANT
HOLDER FOLEY CATH W/STRAP (MISCELLANEOUS) ×3 IMPLANT
IRRIG SUCT STRYKERFLOW 2 WTIP (MISCELLANEOUS) ×3
IRRIGATION SUCT STRKRFLW 2 WTP (MISCELLANEOUS) ×3 IMPLANT
IV NS 250ML (IV SOLUTION) ×3
IV NS 250ML BAXH (IV SOLUTION) IMPLANT
KIT PINK PAD W/HEAD ARE REST (MISCELLANEOUS) ×3
KIT PINK PAD W/HEAD ARM REST (MISCELLANEOUS) ×3 IMPLANT
KIT TURNOVER CYSTO (KITS) ×3 IMPLANT
LEGGING LITHOTOMY PAIR STRL (DRAPES) ×3 IMPLANT
MANIFOLD NEPTUNE II (INSTRUMENTS) ×3 IMPLANT
MANIPULATOR ADVINCU DEL 3.0 PL (MISCELLANEOUS) IMPLANT
MESH VERTESSA LITE -Y 2X4X3 (Mesh General) IMPLANT
NDL HYPO 22X1.5 SAFETY MO (MISCELLANEOUS) ×3 IMPLANT
NDL INSUFFLATION 14GA 120MM (NEEDLE) ×3 IMPLANT
NEEDLE HYPO 22X1.5 SAFETY MO (MISCELLANEOUS) ×3
NEEDLE INSUFFLATION 14GA 120MM (NEEDLE) ×3
OBTURATOR OPTICAL STND 8 DVNC (TROCAR) ×3
OBTURATOR OPTICALSTD 8 DVNC (TROCAR) ×3 IMPLANT
PACK CYSTO (CUSTOM PROCEDURE TRAY) ×3 IMPLANT
PACK ROBOT WH (CUSTOM PROCEDURE TRAY) ×3 IMPLANT
PACK ROBOTIC GOWN (GOWN DISPOSABLE) ×3 IMPLANT
PACK VAGINAL WOMENS (CUSTOM PROCEDURE TRAY) ×3 IMPLANT
PAD OB MATERNITY 4.3X12.25 (PERSONAL CARE ITEMS) ×3 IMPLANT
PAD PREP 24X48 CUFFED NSTRL (MISCELLANEOUS) ×3 IMPLANT
PROTECTOR NERVE ULNAR (MISCELLANEOUS) ×3 IMPLANT
SCISSORS MNPLR CVD DVNC XI (INSTRUMENTS) ×3 IMPLANT
SCRUB CHG 4% DYNA-HEX 4OZ (MISCELLANEOUS) ×3 IMPLANT
SEAL UNIV 5-12 XI (MISCELLANEOUS) ×15 IMPLANT
SEALER VESSEL EXT DVNC XI (MISCELLANEOUS) IMPLANT
SET IRRIG Y TYPE TUR BLADDER L (SET/KITS/TRAYS/PACK) ×3 IMPLANT
SET TUBE SMOKE EVAC HIGH FLOW (TUBING) ×3 IMPLANT
SLEEVE SCD COMPRESS KNEE MED (STOCKING) ×3 IMPLANT
SPIKE FLUID TRANSFER (MISCELLANEOUS) ×6 IMPLANT
SUCTION TUBE FRAZIER 10FR DISP (SUCTIONS) ×3 IMPLANT
SUT GORETEX NAB #0 THX26 36IN (SUTURE) ×3 IMPLANT
SUT MNCRL AB 4-0 PS2 18 (SUTURE) ×6 IMPLANT
SUT MON AB 2-0 SH 27 (SUTURE) ×3 IMPLANT
SUT V-LOC BARB 180 2/0GR9 GS23 (SUTURE) ×9
SUT VIC AB 0 CT1 27 (SUTURE) ×9
SUT VIC AB 0 CT1 27XBRD ANBCTR (SUTURE) IMPLANT
SUT VIC AB 0 CT1 27XBRD ANTBC (SUTURE) ×6 IMPLANT
SUT VIC AB 2-0 SH 27 (SUTURE) ×3
SUT VIC AB 2-0 SH 27XBRD (SUTURE) IMPLANT
SUT VIC AB CT1 27XBRD ANBCTRL (SUTURE) ×3
SUT VLOC 180 0 9IN GS21 (SUTURE) ×3 IMPLANT
SUTURE V-LC BRB 180 2/0GR9GS23 (SUTURE) ×6 IMPLANT
SYR BULB EAR ULCER 3OZ GRN STR (SYRINGE) ×3 IMPLANT
TOWEL OR 17X24 6PK STRL BLUE (TOWEL DISPOSABLE) ×3 IMPLANT
WATER STERILE IRR 500ML POUR (IV SOLUTION) IMPLANT

## 2023-10-28 NOTE — Anesthesia Procedure Notes (Signed)
Procedure Name: Intubation Date/Time: 10/28/2023 12:42 PM  Performed by: Jessica Priest, CRNAPre-anesthesia Checklist: Patient identified, Emergency Drugs available, Suction available, Patient being monitored and Timeout performed Patient Re-evaluated:Patient Re-evaluated prior to induction Oxygen Delivery Method: Circle system utilized Preoxygenation: Pre-oxygenation with 100% oxygen Induction Type: IV induction Ventilation: Mask ventilation without difficulty Laryngoscope Size: Mac and 3 Grade View: Grade II Tube type: Oral Tube size: 7.0 mm Number of attempts: 1 Airway Equipment and Method: Stylet and Oral airway Placement Confirmation: ETT inserted through vocal cords under direct vision, positive ETCO2, breath sounds checked- equal and bilateral and CO2 detector Secured at: 22 cm Tube secured with: Tape Dental Injury: Teeth and Oropharynx as per pre-operative assessment

## 2023-10-28 NOTE — Discharge Instructions (Signed)

## 2023-10-28 NOTE — Op Note (Signed)
Operative Note  Preoperative Diagnosis: anterior vaginal prolapse, posterior vaginal prolapse, and uterovaginal prolapse, incomplete  Postoperative Diagnosis: same  Procedures performed:  Robotic assisted total laparoscopic hysterectomy with bilateral salpingo-oophorectomy, sacrocolpopexy [name]), cystoscopy, perineorrhaphy  Implants:  Implant Name Type Inv. Item Serial No. Manufacturer Lot No. LRB No. Used Action  MESH Grayland Ormond [id number] Mesh General MESH Arlys John  [facility] MEDICAL [id number] N/A 1 Implanted    Attending Surgeon: Lanetta Inch, MD  Anesthesia: General endotracheal  Findings: 1. On vaginal exam, stage II prolapse noted  2. On laparoscopy, normal appearing uterus, bilateral fallopian tubes and ovaries.   Specimens:  ID Type Source Tests Collected by Time Destination  1 : cervix, uterus, bilateral tubes and ovaries Tissue PATH Gyn benign resection SURGICAL PATHOLOGY Marguerita Beards, MD 10/28/2023 1357     Estimated blood loss: 100 mL  IV fluids: 1300 mL  Urine output: 200 mL  Complications: none  Procedure in Detail:  After informed consent was obtained, the patient was taken to the operating room, where general anesthesia was induced and found to be adequate. She was placed in dorsolithotomy position in yellowfin stirrups. Her hips were noted not to be hyperflexed or hyperextended. Her arms were padded with gel pads and tucked to her sides. Her hands were surrounded by foam. A padded strap was placed across her chest with foam between the pad and her skin. She was noted to be appropriately positioned with all pressure points well padded and off tension. A tilt test showed no slippage. She was prepped and draped in the usual sterile fashion. A uterine manipulator was placed in the uterus after sounding to 8 cm, an appropriately sized Koh ring was placed around the cervix, and a pneumo-occluder balloon was positioned in  the vagina for later use.  A sterile Foley catheter was inserted.   0.25% plain Marcaine was injected in the supraumbilical  area and an 8 mm supraumbilical skin incision was made with the scalpel.  A Veress needle was inserted into the incision, CO2 insufflation was started, a low opening pressure was noted, and pneumoperitoneum was obtained. The Veress needle was removed and a 8mm robotic trocar was placed. The robotic camera was inserted and intraperitoneal placement was confirmed. Survey of the abdomen and pelvis revealed the findings as noted above. The sacrum appeared to be free of any adhesive disease. After determining placement for the other ports, Local anesthetic was injected at each site and two 8 mm incisions were made for robotic ports at 10 cm lateral to and at the level of the umbilical port. Two additional 8 mm incisions were made 10 cm lateral to these and 30 degrees down followed by 8 mm robotic ports - the right side for an assistant port. All trocars were placed sequentially under direct visualization of the camera. The patient was placed in Trendelenburg. The robot was docked on the patient's right side. Monopolar endoshears were placed in the right arm, a [place] bipolar grasper was placed in the 2nd arm of the patient's left side, and a Tip up grasper was placed in the 3rd arm on the patient's left side.   Attention was then turned to the sacral promontory. The peritoneum overlying the sacral promontory was tented up, dissected sharply with monopolar scissors and electrosurgery using layer by layer technique. The peritoneal incision was extended down to the posterior cul-de-sac. This was performed with care to avoid the ureter on the right side and  the sigmoid colon and its mesentary on the left side. Two transverse sutures of CV2 Gortex were placed in the anterior longitudinal ligament.  Attention was then turned to the robotic hysterectomy and BSO. The ureter was identified and was  found to be well away from the planned site of incision. Using the monopolar scissors, a window was made on the posterior leaf of the broad ligament. The infundibulopelvic ligament was cauterized and transected. The right round ligament was grasped, cauterized, and transected with electrocautery. The anterior and posterior leaves of the broad ligament were taken down with cautery and sharp dissection. The uterine artery was skeletonized and the bladder flap was created on the right side with a combination of electrosurgery and sharp dissection. The KOH ring was identified. The right uterine artery was clamped, cauterized, and transected. In a similar fashion, the left side was taken down. Further sharp dissection with combination of cautery was performed to further develop the bladder flap. At this point, the KOH ring was completely hugging the cervix. The pneumo-occluder balloon in the vagina was inflated to maintain pneumoperitoneum. A colpotomy was performed with electrosurgical cutting current and the uterus and cervix were completely amputated from the vagina. The specimen was delivered through the vagina. The posterior portion of the vaginal cuff was then grasped and pulled up to maintain pneumoperitoneum. The pneumo-occluder balloon was then replaced in the vagina. The right hand instrument was changed to a suture-cut needle driver. The vaginal cuff was then closed using a 0 V-lock suture in two layers.    The right hand instrument was replaced with monopolar endoshears. With a lucite probe in the vagina, the anterior vaginal dissection was then performed with sharp dissection and electrosurgery. The posterior vaginal dissection was then performed with sharp dissection and electrosurgery in order to dissect the rectum away from the posterior vagina.  A "Y" mesh was then inserted into the abdomen after trimming to appropriate size. With the probe in the vagina, the anterior leaf of the Y mesh was affixed to  the anterior portion of the vagina using a 2-0 v-loc suture in a spiral pattern to distribute the suture evenly across the surface of the anterior mesh leaf. In a similar fashion, the posterior leaf of the Y mesh was attached to the posterior surface of the vagina with 2-0 v-loc suture.  The distal end of the mesh was then brought to overlie the sacrum. The correct amount of tension was determined in order to elevate the vagina, but not put the mesh under tension. The distal end of the mesh was then affixed to the anterior longitudinal sacral ligament using two interrupted transverse stitches of CV2 Gortex. The excess distal mesh was then cut and removed. The peritoneum was reapproximated over the mesh using 2-0 monocryl. Arista was placed over the surgical sites. The bladder flap was incorporated to completely retroperitonealize the mesh. All pedicles were carefully inspected and noted to be hemostatic as the CO2 gas was deflated. All instruments were removed from the patient's abdomen.   The Foley catheter was removed.  A 70-degree cystoscope was introduced, and 360-degree inspection revealed no injury, lesion or foreign body in the bladder. Brisk bilateral ureteral efflux was noted with the assistance of pyridium.  The bladder was drained and the cystoscope was removed.  The Foley catheter was replaced.  The robot was undocked. The CO2 gas was removed and the ports were removed.  The skin incisions were closed with subcutaneous stitches of 4-0 Monocryl and covered  with skin glue.    Attention was then turned to the perineum. Two allis clamps were placed at the introitus. The perineum was injected with 1% lidocaine with epinephrine. A diamond shaped incision was made over the perineum and excess skin was removed. Dissection was performed with Metzenbaum scissors to separate the mucosa from the underlying tissue. The perineal body was then reapproximated with two interrupted 0-vicryl sutures. The perineal  skin was then closed with a 2-0 vicryl in a subcutaneous and subcuticular fashion. Irrigation was performed and good hemostasis was noted. Sponge, lap, and needle counts were correct x 2. The patient tolerated the procedure well. She was awakened from anesthesia and transferred to the recovery room in stable condition.   Marguerita Beards, MD

## 2023-10-28 NOTE — Interval H&P Note (Signed)
History and Physical Interval Note:  10/28/2023 12:25 PM  Rhonda [name]  has presented today for surgery, with the diagnosis of anterior vaginal prolapse; uterovaginal prolapse incomplete; posterior vaginal prolapse.  The various methods of treatment have been discussed with the patient and family. After consideration of risks, benefits and other options for treatment, the patient has consented to  Procedure(s) with comments: XI ROBOTIC ASSISTED TOTAL HYSTERECTOMY WITH BILATERAL SALPINGO-OOPHORECTOMY AND  SACROCOLPOPEXY (N/A)  PERINEOPLASTY (N/A) CYSTOSCOPY (N/A) as a surgical intervention.  The patient's history has been reviewed, patient examined, no change in status, stable for surgery.  I have reviewed the patient's chart and labs.  Questions were answered to the patient's satisfaction.     Marguerita Beards

## 2023-10-28 NOTE — Transfer of Care (Signed)
Immediate Anesthesia Transfer of Care Note  Patient: Rhonda [name]  Procedure(s) Performed: Procedure(s) (LRB): XI ROBOTIC ASSISTED TOTAL HYSTERECTOMY WITH BILATERAL SALPINGO-OOPHORECTOMY AND  SACROCOLPOPEXY (N/A) PERINEOPLASTY (N/A) CYSTOSCOPY (N/A)  Patient Location: PACU  Anesthesia Type: GA  Level of Consciousness: awake, sedated, patient cooperative and responds to stimulation  Airway & Oxygen Therapy: Patient Spontanous Breathing and Patient connected to [place] oxygen  Post-op Assessment: Report given to PACU RN, Post -op Vital signs reviewed and stable and Patient moving all extremities  Post vital signs: Reviewed and stable  Complications: No apparent anesthesia complications

## 2023-10-29 DIAGNOSIS — Z79899 Other long term (current) drug therapy: Secondary | ICD-10-CM | POA: Diagnosis not present

## 2023-10-29 DIAGNOSIS — F419 Anxiety disorder, unspecified: Secondary | ICD-10-CM | POA: Diagnosis not present

## 2023-10-29 DIAGNOSIS — N8003 Adenomyosis of the uterus: Secondary | ICD-10-CM | POA: Diagnosis not present

## 2023-10-29 DIAGNOSIS — N888 Other specified noninflammatory disorders of cervix uteri: Secondary | ICD-10-CM | POA: Diagnosis not present

## 2023-10-29 DIAGNOSIS — I739 Peripheral vascular disease, unspecified: Secondary | ICD-10-CM | POA: Diagnosis not present

## 2023-10-29 DIAGNOSIS — I10 Essential (primary) hypertension: Secondary | ICD-10-CM | POA: Diagnosis not present

## 2023-10-29 DIAGNOSIS — Z8249 Family history of ischemic heart disease and other diseases of the circulatory system: Secondary | ICD-10-CM | POA: Diagnosis not present

## 2023-10-29 DIAGNOSIS — N812 Incomplete uterovaginal prolapse: Secondary | ICD-10-CM | POA: Diagnosis not present

## 2023-10-29 LAB — SURGICAL PATHOLOGY

## 2023-10-29 MED ORDER — IBUPROFEN 200 MG PO TABS
ORAL_TABLET | ORAL | Status: AC
Start: 2023-10-29 — End: ?
  Filled 2023-10-29: qty 3

## 2023-10-29 MED ORDER — ACETAMINOPHEN 325 MG PO TABS
ORAL_TABLET | ORAL | Status: AC
  Filled 2023-10-29: qty 2

## 2023-10-29 MED ORDER — IBUPROFEN 200 MG PO TABS
ORAL_TABLET | ORAL | Status: AC
  Filled 2023-10-29: qty 3

## 2023-10-29 NOTE — Anesthesia Postprocedure Evaluation (Signed)
Anesthesia Post Note  Patient: CARLY ROADY  Procedure(s) Performed: XI ROBOTIC ASSISTED TOTAL HYSTERECTOMY WITH BILATERAL SALPINGO-OOPHORECTOMY AND  SACROCOLPOPEXY (Pelvis) PERINEOPLASTY (Vagina ) CYSTOSCOPY (Bladder)     Patient location during evaluation: PACU Anesthesia Type: General Level of consciousness: awake and alert Pain management: pain level controlled Vital Signs Assessment: post-procedure vital signs reviewed and stable Respiratory status: spontaneous breathing, nonlabored ventilation and respiratory function stable Cardiovascular status: blood pressure returned to baseline and stable Postop Assessment: no apparent nausea or vomiting Anesthetic complications: no   No notable events documented.  Last Vitals:  Vitals:   10/29/23 0515 10/29/23 0830  BP: (!) 117/54 (!) 118/58  Pulse: 65 72  Resp: (!) 24 (!) 22  Temp: 37.2 C 36.6 C  SpO2: 95% 97%    Last Pain:  Vitals:   10/29/23 0830  TempSrc:   PainSc: 3    Pain Goal: Patients Stated Pain Goal: 2 (10/29/23 0830)                 Lowella Curb

## 2023-10-29 NOTE — Discharge Summary (Signed)
Physician Discharge Summary  Patient ID: Rhonda [name] MRN: [id number] DOB/AGE: 22-Jun-1961 62 [name].o.  Admit date: 10/28/2023 Discharge date: 10/29/2023  Admission Diagnoses:  Discharge Diagnoses:  Principal Problem:   Uterovaginal prolapse, incomplete   Discharged Condition: good  Hospital Course: 62yo F presenting for scheduled procedure for prolapse: Robotic assisted total laparoscopic hysterectomy with bilateral salpingo-oophorectomy, sacrocolpopexy Rhonda [name]), cystoscopy, perineorrhaphy. Post-operatively, her pain was well controlled with oral medications, she tolerated regular diet and ambulated well. She passed her voiding trial. On POD #1 she was deemed stable for discharge.   Consults: None  Significant Diagnostic Studies: none  Treatments: surgery:    Discharge Exam: Blood pressure (!) 118/58, pulse 72, temperature 97.9 F (36.6 C), resp. rate (!) 22, height 5\' 4"  (1.626 m), weight 75.1 kg, last menstrual period 04/05/2016, SpO2 97%. General appearance: alert and cooperative Resp: clear to auscultation bilaterally Cardio: regular rate and rhythm, S1, S2 normal, no murmur, click, rub or gallop GI: soft, non-tender; bowel sounds normal; no masses. Incisions c/d/i Extremities: extremities normal, atraumatic, no cyanosis or edema  Disposition: Discharge disposition: 01-Home or Self Care       Discharge Instructions     Call MD for:  persistant nausea and vomiting   Complete by: As directed    Call MD for:  redness, tenderness, or signs of infection (pain, swelling, redness, odor or green/yellow discharge around incision site)   Complete by: As directed    Call MD for:  severe uncontrolled pain   Complete by: As directed    Call MD for:  temperature >100.4   Complete by: As directed    Diet general   Complete by: As directed    Increase activity slowly   Complete by: As directed    May walk up steps   Complete by: As directed       Allergies as of  10/29/2023       Reactions   Fluconazole Anaphylaxis, Other (See Comments)   Tongue swelling Other reaction(s): Anaphalaxis        Medication List     TAKE these medications    acetaminophen 500 MG tablet Commonly known as: TYLENOL Take 1 tablet (500 mg total) by mouth every 6 (six) hours as needed (pain).   amLODipine 5 MG tablet Commonly known as: NORVASC Take 5 mg by mouth daily.   anastrozole 1 MG tablet Commonly known as: ARIMIDEX TAKE 1 TABLET BY MOUTH EVERY DAY What changed: when to take this   FISH OIL CONCENTRATE PO Take by mouth daily at 12 noon.   Formula 3 The Treatment 1 % external solution Generic drug: tolnaftate   ibuprofen 600 MG tablet Commonly known as: ADVIL Take 1 tablet (600 mg total) by mouth every 6 (six) hours as needed.   lactobacillus acidophilus Tabs tablet Take 1 tablet by mouth 3 (three) times a week. Reported on 12/12/2015   LORazepam 0.5 MG tablet Commonly known as: ATIVAN Take 0.5 mg by mouth every 6 (six) hours as needed. PRN   Magnesium 300 MG Caps Take 1 capsule by mouth in the morning and at bedtime.   multivitamin-lutein Caps capsule Take 1 capsule by mouth daily.   ondansetron 4 MG tablet Commonly known as: ZOFRAN Take 1 tablet (4 mg total) by mouth every 8 (eight) hours as needed for nausea or vomiting.   OVER THE COUNTER MEDICATION Vitamin D 3 2000 iu taking in the a.m.   OVER THE COUNTER MEDICATION Flaxseed oil 1000 mg taking  daily   oxyCODONE 5 MG immediate release tablet Commonly known as: Oxy IR/ROXICODONE Take 1 tablet (5 mg total) by mouth every 4 (four) hours as needed for severe pain (pain score 7-10).   polyethylene glycol powder 17 GM/SCOOP powder Commonly known as: GLYCOLAX/MIRALAX Take 17 g by mouth daily. Drink 17g (1 scoop) dissolved in water per day.   vitamin C 1000 MG tablet Take 1,000 mg by mouth daily.   ZIOPTAN OP Apply to eye.         Signed: Marguerita Beards 10/29/2023,  4:58 PM

## 2023-10-30 ENCOUNTER — Encounter (HOSPITAL_BASED_OUTPATIENT_CLINIC_OR_DEPARTMENT_OTHER): Payer: Self-pay | Admitting: Obstetrics and Gynecology

## 2023-10-31 ENCOUNTER — Telehealth: Payer: Self-pay

## 2023-10-31 NOTE — Telephone Encounter (Signed)
Rhonda [name] is a 62 y.o. female called in asking if it was ok to take mile of magnesia tonight because she is feeling very constipated. I spoke to Dr Florian Buff and she said that was ok

## 2023-11-21 DIAGNOSIS — Z853 Personal history of malignant neoplasm of breast: Secondary | ICD-10-CM | POA: Diagnosis not present

## 2023-11-26 ENCOUNTER — Ambulatory Visit: Payer: BC Managed Care – PPO | Admitting: Physical Therapy

## 2023-12-05 ENCOUNTER — Encounter: Payer: Self-pay | Admitting: Obstetrics and Gynecology

## 2023-12-05 ENCOUNTER — Ambulatory Visit (INDEPENDENT_AMBULATORY_CARE_PROVIDER_SITE_OTHER): Payer: BC Managed Care – PPO | Admitting: Obstetrics and Gynecology

## 2023-12-05 ENCOUNTER — Encounter: Payer: Self-pay | Admitting: Hematology and Oncology

## 2023-12-05 VITALS — BP 138/78 | HR 70

## 2023-12-05 DIAGNOSIS — Z48816 Encounter for surgical aftercare following surgery on the genitourinary system: Secondary | ICD-10-CM

## 2023-12-05 DIAGNOSIS — N3281 Overactive bladder: Secondary | ICD-10-CM

## 2023-12-05 DIAGNOSIS — Z9889 Other specified postprocedural states: Secondary | ICD-10-CM

## 2023-12-05 MED ORDER — MIRABEGRON ER 25 MG PO TB24: 25.0000 mg | ORAL_TABLET | Freq: Every day | ORAL | 5 refills | Status: DC

## 2023-12-05 NOTE — Progress Notes (Signed)
[facility]  Date of Visit: 12/05/2023  History of Present Illness: Ms. Cress is a 62 y.o. female scheduled today for a post-operative visit.   Surgery: s/p Robotic assisted total laparoscopic hysterectomy with bilateral salpingo-oophorectomy, sacrocolpopexy [name]), cystoscopy, perineorrhaphy on 10/28/23  She passed her postoperative void trial.   Postoperative course has been uncomplicated.   Has been feeling good since about a week after surgery. Not feeling any prolapse.  No urinary leakage but still waking up at night. 3-4 times a night which was also happening prior to surgery.   UTI in the last 6 weeks? No  Pain? No  She has returned to her normal activity (except for postop restrictions) Vaginal bulge? No  Stress incontinence: No  Urgency/frequency: Yes  Urge incontinence: No  Voiding dysfunction: No  Bowel issues: No   Subjective Success: Do you usually have a bulge or something falling out that you can see or feel in the vaginal area? No  Retreatment Success: Any retreatment with surgery or pessary for any compartment? No   Pathology results: UTERUS, CERVIX, BILATERAL FALLOPIAN TUBES AND OVARIES:  Cervix:           Unremarkable.            Negative for dysplasia or malignancy.        Endocervix:            Nabothian cysts.            Negative for hyperplasia, atypia or malignancy.        Endometrium:            Benign inactive endometrium.            Negative for hyperplasia, atypia or malignancy.        Myometrium:            Adenomyosis.            Negative for malignancy.        Serosa:            Unremarkable.            Negative for malignancy.        Bilateral fallopian tubes:            Benign fimbriated fallopian tubes.            Negative for malignancy.        Bilateral ovaries:            Unremarkable.            Negative for malignancy.   Medications: She has a current medication list which includes the following  prescription(s): amlodipine, anastrozole, vitamin c, lactobacillus acidophilus, lorazepam, magnesium, mirabegron er, multivitamin-lutein, omega-3 fatty acids, OVER THE COUNTER MEDICATION, OVER THE COUNTER MEDICATION, tafluprost, and formula 3 the treatment.   Allergies: Patient is allergic to fluconazole.   Physical Exam: BP 138/78   Pulse 70   LMP 04/05/2016 (Approximate)   Abdomen: soft, non-tender, without masses or organomegaly  Pelvic Examination: Sutures present at the perineum, tender to palpation.  Vagina: Incisions healing well. Sutures are present at incision line and there is not granulation tissue. No tenderness along the anterior or posterior vagina. No apical tenderness. No pelvic masses. No visible or palpable mesh.  POP-Q: POP-Q  -3  Aa   -3                                           Ba  -9                                              C   2                                            Gh  4.5                                            Pb  9                                            tvl   -3                                            Ap  -3                                            Bp                                                 D     ---------------------------------------------------------  Assessment and Plan:  1. Post-operative state   2. Overactive bladder    - Could benefit from vaginal estrogen at the perineal site for healing. She is concerned due to her history of breast cancer. Encouraged her to ask her oncologist if this would be acceptable for her.  - Pathology results were reviewed with the patient today and she verbalized understanding that the results were benign.  - Can resume regular activity. Wait for intercourse until next visit.  - Discussed avoidance of heavy lifting and straining long term to reduce the risk of recurrence.  - Start Myrbetriq 25mg  daily for OAB. Unclear if this is covered  or the cost- will order to see if she can get a cost estimate.  All questions answered.   Return in about 1 month (around 01/05/2024).  Marguerita Beards, MD

## 2023-12-08 ENCOUNTER — Encounter: Payer: Self-pay | Admitting: Obstetrics and Gynecology

## 2023-12-08 DIAGNOSIS — Z9889 Other specified postprocedural states: Secondary | ICD-10-CM

## 2023-12-11 MED ORDER — ESTRADIOL 0.1 MG/GM VA CREA: 0.5000 g | TOPICAL_CREAM | VAGINAL | 11 refills | Status: DC

## 2023-12-17 DIAGNOSIS — M810 Age-related osteoporosis without current pathological fracture: Secondary | ICD-10-CM

## 2023-12-17 HISTORY — DX: Age-related osteoporosis without current pathological fracture: M81.0

## 2024-01-09 ENCOUNTER — Ambulatory Visit (INDEPENDENT_AMBULATORY_CARE_PROVIDER_SITE_OTHER): Payer: BC Managed Care – PPO | Admitting: Obstetrics and Gynecology

## 2024-01-09 ENCOUNTER — Encounter: Payer: Self-pay | Admitting: Obstetrics and Gynecology

## 2024-01-09 VITALS — BP 132/70 | HR 92

## 2024-01-09 DIAGNOSIS — Z48816 Encounter for surgical aftercare following surgery on the genitourinary system: Secondary | ICD-10-CM

## 2024-01-09 DIAGNOSIS — Z9889 Other specified postprocedural states: Secondary | ICD-10-CM

## 2024-01-09 NOTE — Progress Notes (Signed)
[facility]  Date of Visit: 01/09/2024  History of Present Illness: Rhonda [name] is a 63 y.o. female scheduled today for a post-operative visit.   Surgery: s/p Robotic assisted total laparoscopic hysterectomy with bilateral salpingo-oophorectomy, sacrocolpopexy [name]), cystoscopy, perineorrhaphy on 10/28/23  Has been using the estrogen as prescribed. She has been feeling well- very happy. Has been getting back into exercise and has been feeling good, not having any pressure.   She did not take the myrbetriq. She feels that her symptoms have improved. Wakes 1-2 times per night which is back to her baseline. No issues during the day with urgency.   Medications: She has a current medication list which includes the following prescription(s): amlodipine, anastrozole, vitamin c, estradiol, lactobacillus acidophilus, lorazepam, magnesium, multivitamin-lutein, omega-3 fatty acids, OVER THE COUNTER MEDICATION, OVER THE COUNTER MEDICATION, tafluprost, and formula 3 the treatment.   Allergies: Patient is allergic to fluconazole.   Physical Exam: BP 132/70   Pulse 92   LMP 04/05/2016 (Approximate)   Abdomen: soft, non-tender, without masses or organomegaly  Pelvic Examination: Perineum well healed with tiny piece of granulation tissue- treated with silver nitrate.  Vagina: Incisions healing well. Sutures are present at the cuff and there is not granulation tissue. No tenderness along the anterior or posterior vagina. No apical tenderness. No pelvic masses. No visible or palpable mesh.  POP-Q (12/05/23): POP-Q   -3                                            Aa   -3                                           Ba   -9                                              C    2                                            Gh   4.5                                            Pb   9                                            tvl    -3                                            Ap   -3  Bp                                                  D       ---------------------------------------------------------  Assessment and Plan:  1. Post-operative state     - Well healed. Wait 2 weeks for intercourse.  - No need to start medication if her nocturia is controlled and back at her baseline.   Return as needed  Marguerita Beards, MD

## 2024-01-28 ENCOUNTER — Other Ambulatory Visit: Payer: Self-pay | Admitting: Hematology and Oncology

## 2024-02-23 ENCOUNTER — Ambulatory Visit
Admission: RE | Admit: 2024-02-23 | Discharge: 2024-02-23 | Disposition: A | Discharge status: Home / Self Care | Payer: BC Managed Care – PPO | Source: Ambulatory Visit | Attending: Hematology and Oncology | Admitting: Hematology and Oncology

## 2024-02-23 DIAGNOSIS — C50211 Malignant neoplasm of upper-inner quadrant of right female breast: Secondary | ICD-10-CM

## 2024-02-23 DIAGNOSIS — Z853 Personal history of malignant neoplasm of breast: Secondary | ICD-10-CM | POA: Diagnosis not present

## 2024-02-23 DIAGNOSIS — Z9011 Acquired absence of right breast and nipple: Secondary | ICD-10-CM | POA: Diagnosis not present

## 2024-02-24 ENCOUNTER — Encounter: Payer: Self-pay | Admitting: Hematology and Oncology

## 2024-03-12 ENCOUNTER — Encounter: Payer: Self-pay | Admitting: *Deleted

## 2024-03-15 DIAGNOSIS — H2513 Age-related nuclear cataract, bilateral: Secondary | ICD-10-CM | POA: Diagnosis not present

## 2024-03-15 DIAGNOSIS — H401131 Primary open-angle glaucoma, bilateral, mild stage: Secondary | ICD-10-CM | POA: Diagnosis not present

## 2024-05-07 ENCOUNTER — Telehealth: Payer: Self-pay

## 2024-05-07 ENCOUNTER — Other Ambulatory Visit: Payer: Self-pay | Admitting: *Deleted

## 2024-05-07 DIAGNOSIS — Z0000 Encounter for general adult medical examination without abnormal findings: Secondary | ICD-10-CM | POA: Diagnosis not present

## 2024-05-07 DIAGNOSIS — E7800 Pure hypercholesterolemia, unspecified: Secondary | ICD-10-CM | POA: Diagnosis not present

## 2024-05-07 DIAGNOSIS — E559 Vitamin D deficiency, unspecified: Secondary | ICD-10-CM | POA: Diagnosis not present

## 2024-05-07 DIAGNOSIS — F419 Anxiety disorder, unspecified: Secondary | ICD-10-CM | POA: Diagnosis not present

## 2024-05-07 DIAGNOSIS — C50211 Malignant neoplasm of upper-inner quadrant of right female breast: Secondary | ICD-10-CM

## 2024-05-07 DIAGNOSIS — I10 Essential (primary) hypertension: Secondary | ICD-10-CM | POA: Diagnosis not present

## 2024-05-07 DIAGNOSIS — Z853 Personal history of malignant neoplasm of breast: Secondary | ICD-10-CM | POA: Diagnosis not present

## 2024-05-07 NOTE — Telephone Encounter (Signed)
Pt verbally confirmed appt

## 2024-05-11 ENCOUNTER — Encounter: Payer: Self-pay | Admitting: Hematology and Oncology

## 2024-05-11 ENCOUNTER — Inpatient Hospital Stay: Payer: BC Managed Care – PPO

## 2024-05-11 ENCOUNTER — Inpatient Hospital Stay: Payer: BC Managed Care – PPO | Attending: Hematology and Oncology | Admitting: Hematology and Oncology

## 2024-05-11 VITALS — BP 156/80 | HR 82 | Temp 99.7°F | Resp 17 | Ht 64.0 in | Wt 169.5 lb

## 2024-05-11 DIAGNOSIS — C50211 Malignant neoplasm of upper-inner quadrant of right female breast: Secondary | ICD-10-CM | POA: Diagnosis not present

## 2024-05-11 DIAGNOSIS — Z803 Family history of malignant neoplasm of breast: Secondary | ICD-10-CM | POA: Diagnosis not present

## 2024-05-11 DIAGNOSIS — N814 Uterovaginal prolapse, unspecified: Secondary | ICD-10-CM | POA: Diagnosis not present

## 2024-05-11 DIAGNOSIS — F419 Anxiety disorder, unspecified: Secondary | ICD-10-CM | POA: Insufficient documentation

## 2024-05-11 DIAGNOSIS — Z170 Estrogen receptor positive status [ER+]: Secondary | ICD-10-CM | POA: Diagnosis not present

## 2024-05-11 DIAGNOSIS — Z1721 Progesterone receptor positive status: Secondary | ICD-10-CM | POA: Diagnosis not present

## 2024-05-11 DIAGNOSIS — Z8042 Family history of malignant neoplasm of prostate: Secondary | ICD-10-CM | POA: Insufficient documentation

## 2024-05-11 DIAGNOSIS — Z79811 Long term (current) use of aromatase inhibitors: Secondary | ICD-10-CM | POA: Diagnosis not present

## 2024-05-11 DIAGNOSIS — E785 Hyperlipidemia, unspecified: Secondary | ICD-10-CM | POA: Diagnosis not present

## 2024-05-11 DIAGNOSIS — Z923 Personal history of irradiation: Secondary | ICD-10-CM | POA: Insufficient documentation

## 2024-05-11 LAB — CMP (CANCER CENTER ONLY)
ALT: 14 U/L (ref 0–44)
AST: 19 U/L (ref 15–41)
Albumin: 4.5 g/dL (ref 3.5–5.0)
Alkaline Phosphatase: 64 U/L (ref 38–126)
Anion gap: 7 (ref 5–15)
BUN: 18 mg/dL (ref 8–23)
CO2: 28 mmol/L (ref 22–32)
Calcium: 9.4 mg/dL (ref 8.9–10.3)
Chloride: 105 mmol/L (ref 98–111)
Creatinine: 0.73 mg/dL (ref 0.44–1.00)
GFR, Estimated: >60 mL/min (ref >60)
Glucose, Bld: 95 mg/dL (ref 70–99)
Potassium: 4.1 mmol/L (ref 3.5–5.1)
Sodium: 140 mmol/L (ref 135–145)
Total Bilirubin: 0.9 mg/dL (ref 0.0–1.2)
Total Protein: 7.3 g/dL (ref 6.5–8.1)

## 2024-05-11 LAB — CBC WITH DIFFERENTIAL (CANCER CENTER ONLY)
Abs Immature Granulocytes: 0.06 10*3/uL (ref 0.00–0.07)
Basophils Absolute: 0 10*3/uL (ref 0.0–0.1)
Basophils Relative: 1 %
Eosinophils Absolute: 0.2 10*3/uL (ref 0.0–0.5)
Eosinophils Relative: 3 %
HCT: 44.6 % (ref 36.0–46.0)
Hemoglobin: 15 g/dL (ref 12.0–15.0)
Immature Granulocytes: 1 %
Lymphocytes Relative: 24 %
Lymphs Abs: 1.3 10*3/uL (ref 0.7–4.0)
MCH: 29.4 pg (ref 26.0–34.0)
MCHC: 33.6 g/dL (ref 30.0–36.0)
MCV: 87.3 fL (ref 80.0–100.0)
Monocytes Absolute: 0.4 10*3/uL (ref 0.1–1.0)
Monocytes Relative: 8 %
Neutro Abs: 3.3 10*3/uL (ref 1.7–7.7)
Neutrophils Relative %: 63 %
Platelet Count: 211 10*3/uL (ref 150–400)
RBC: 5.11 MIL/uL (ref 3.87–5.11)
RDW: 12.2 % (ref 11.5–15.5)
WBC Count: 5.2 10*3/uL (ref 4.0–10.5)
nRBC: 0 % (ref 0.0–0.2)

## 2024-05-11 NOTE — Progress Notes (Signed)
 [facility]  Telephone:([phone]) [phone] [phone]) [phone]    ID: Rhonda [name] DOB: May 14, 1961  MR#: [id number]  HYQ#:[id number]  Patient Care Team: Tena Feeling, MD as PCP - General (Internal Medicine) Auther Bo, RN as Oncology Nurse Navigator Alane Hsu, RN as Oncology Nurse Navigator Magrinat, Rozella Cornfield, MD (Inactive) as Consulting Physician (Oncology) Retta Caster, MD as Consulting Physician (Radiation Oncology) Greta Leatherwood, MD as Consulting Physician (Obstetrics and Gynecology) Katherin Pam, MD as Referring Physician (Dermatology) Alvina Axon, MD as Consulting Physician (Ophthalmology) Rica Chalet, MD as Consulting Physician (Vascular Surgery) Oza Blumenthal, MD as Consulting Physician (General Surgery)  CHIEF COMPLAINT: Estrogen receptor positive breast cancer  CURRENT TREATMENT: anastrozole   INTERVAL HISTORY:  Jamiyla returns today for follow-up of her estrogen receptor positive breast cancer. She is taking anastrozole  as prescribed.   Discussed the use of AI scribe software for clinical note transcription with the patient, who gave verbal consent to proceed.  History of Present Illness Rhonda [name] is a 63 year old female with history of breast cancer who presents for follow-up after a complete hysterectomy and sacrocolpopexy.   She underwent a complete hysterectomy and sacrocolpopexy six months ago due to a prolapsed uterus and bladder. The surgery involved the removal of the uterus, fallopian tubes, ovaries, and cervix. Post-surgery, she experienced significant fatigue and initially required a catheter. She had a prior bladder sling surgery in 2010, which eventually failed, leading to the recent surgical intervention.  She has a history of breast cancer, diagnosed in 2020, and has been on anastrozole  for five years. She is concerned about the recurrence of breast cancer and the impact of her hysterectomy on this risk. She  feels bloated and tired on anastrozole  but continues to exercise regularly. Her cholesterol levels have been fluctuating despite a healthy diet and regular exercise, which includes walking 10-12 miles a week. She takes vitamin D supplements daily and follows a low-fat diet rich in vegetables and grilled fish.  Her family history includes her mother having tracheal carcinoma and a brain tumor. Her sister, who raised her, was killed by a distracted driver. She has experienced significant stress and anxiety due to these events and other personal tragedies. She has had anxiety since childhood, exacerbated by traumatic events, including her mother's illness and her sister's death. She manages her stress through walking and maintaining a healthy lifestyle.  She is concerned about her bone density and plans to have a DEXA scan, pending insurance authorization. She has had issues with insurance coverage in the past, particularly regarding her hysterectomy. She inquires about the impact of her diet and lifestyle on her health, including the consumption of wine and plant-based estrogens. She is cautious about her diet, avoiding fast food and fried foods, and consuming a balanced diet with fruits, vegetables, and lean proteins.    Rest of the pertinent 10 point ROS reviewed and negative  REVIEW OF SYSTEMS:   COVID 19 VACCINATION STATUS:  Fully vaccinated [employer]), with booster 11/2020   HISTORY OF CURRENT ILLNESS: From the original intake note:  Rhonda [name] had routine screening mammography on 02/12/2019 showing a possible abnormality in the right breast. She underwent unilateral right diagnostic mammography with tomography and right breast ultrasonography at [facility] on 02/17/2019 showing: Breast Density Category C. a persistent irregular mass measuring approximately 0.8 cm. Ultrasound of the right breast at 12 o'clock, 5 cm from the nipple demonstrates an irregular hypoechoic mass with indistinct  margins measuring 0.7 x 0.5 x 0.7 cm. No blood flow seen within the mass on color Doppler imaging. Ultrasound of the right axilla demonstrates multiple normal-appearing nodes.  Accordingly on 02/22/2019 she proceeded to biopsy of the right breast area in question. 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 Prognostic indicators significant for: estrogen receptor, 100% positive and progesterone receptor, 100% positive, both with strong staining intensity. Proliferation marker Ki67 at 1%. HER2 equivocal (2+) by immunohistochemistry but negative by fluorescent in situ hybridization with a signals ratio 1.32 and number per cell 1.85.   The patient's subsequent history is as detailed below.   PAST MEDICAL HISTORY: Past Medical History:  Diagnosis Date   Allergy    seasonal   Anxiety    Follows w/ Dr. Candi Chafe @ [place].   Cancer [facility]) 02/2019   right breast IDC, Follows w/ [facility].   Family history of breast cancer    Family history of prostate cancer    Female bladder prolapse    Fibroid    Glaucoma    open angle   Hypertension    Dr. Candi Chafe at [place].   Leg pain, bilateral    improved   Lung nodules    benign   Other and unspecified hyperlipidemia 04/05/2013   borderline per pt   Peripheral vascular disease (HCC)    chronic venous insufficiency bilaterally   Personal history of radiation therapy    05/24/2019 - 07/08/2019 to right breast   PONV (postoperative nausea and vomiting)    Varicose veins of both lower extremities    Wears glasses    for reading    PAST SURGICAL HISTORY: Past Surgical History:  Procedure Laterality Date   BIOPSY THYROID   2019   FNA of thyroid    BLADDER SUSPENSION  2010   Dr.Silva   BREAST CYST ASPIRATION Right 09/08/2012   BREAST LUMPECTOMY Right 03/19/2019   BREAST LUMPECTOMY WITH RADIOACTIVE SEED AND SENTINEL LYMPH NODE BIOPSY Right 03/22/2019   Procedure: RIGHT  BREAST LUMPECTOMY WITH RADIOACTIVE SEED AND RIGHT AXILLARY SENTINEL LYMPH NODE BIOPSY;  Surgeon: Juanita Norlander, MD;  Location: [facility];  Service: General;  Laterality: Right;   COLONOSCOPY  2018   normal   CYSTOSCOPY N/A 10/28/2023   Procedure: CYSTOSCOPY;  Surgeon: Arma Lamp, MD;  [facility]  Service: Gynecology;  Laterality: N/A;   INSERTION OF MESH N/A 11/01/2016   Procedure: INSERTION OF MESH;  Surgeon: Adalberto Hollow, MD;  Location: [facility] OR;  Service: General;  Laterality: N/A;   PERINEOPLASTY N/A 10/28/2023   Procedure: PERINEOPLASTY;  Surgeon: Arma Lamp, MD;  Location: [facility];  Service: Gynecology;  Laterality: N/A;   RHINOPLASTY     in high school   TONSILLECTOMY     VAGINAL PROLAPSE REPAIR  2010   cystocele and rectocele repair   varicose veins     VEIN LIGATION AND STRIPPING  1995   VENTRAL HERNIA REPAIR N/A 11/01/2016   Procedure: VENTRAL HERNIA REPAIR WITH MESH;  Surgeon: Adalberto Hollow, MD;  Location: [facility] OR;  Service: General;  Laterality: N/A;   XI ROBOTIC ASSISTED TOTAL HYSTERECTOMY WITH SACROCOLPOPEXY N/A 10/28/2023   Procedure: XI ROBOTIC ASSISTED TOTAL HYSTERECTOMY WITH BILATERAL SALPINGO-OOPHORECTOMY AND  SACROCOLPOPEXY;  Surgeon: Arma Lamp, MD;  Location: [facility];  Service: Gynecology;  Laterality: N/A;    FAMILY HISTORY: Family History  Problem Relation Age of Onset  Cancer Maternal Grandmother        breast cancer   Breast cancer Maternal Grandmother        unsure of age   Heart disease Maternal Grandfather    Stroke Brother    Alcohol abuse Brother    Liver disease Brother    Varicose Veins Brother    Heart disease Brother    Cancer Mother    Tracheal cancer Mother    Varicose Veins Father    Heart disease Father    Prostate cancer Father    Heart attack Brother    Mental illness Brother        d. 60   Varicose Veins Paternal  Grandmother    Prostate cancer Brother        possible, unsure if BPA vs prostate cancer  Olivya's father died from congestive heart failure at age 35. Patients' mother died from tracheal carcinoma at age 58. The patient has 4 brothers and 1 sister. Patient denies anyone in her family having ovarian, or pancreatic cancer. Hertha's mother was diagnosed with tracheal carcinoma and acoustic neuroma at age 32. Loa's father was diagnosed with prostate cancer in his 73's. Maddelyn's brother was diagnosed with prostate cancer in his mid 24's. Ikhlas's maternal grandmother was diagnosed with breast cancer in her late 3's.    GYNECOLOGIC HISTORY:  Patient's last menstrual period was 04/05/2016 (approximate). 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 Her husband, Siegfried Dress, is a retired Magazine features editor for [employer]. They have two children, Physiological scientist and [employer]. Landon Pinion is 45, lives in [place], [place], and is a music professor. Durene Gill is 98, lives in [place], [place], and recently graduated with a degree in business.  The patient attends our [employer].   ADVANCED DIRECTIVES: In the absence of any documentation, Cena's spouse, Siegfried Dress, is her healthcare power of attorney.      HEALTH MAINTENANCE: Social History   Tobacco Use   Smoking status: Never   Smokeless tobacco: Never  Vaping Use   Vaping status: Never Used  Substance Use Topics   Alcohol use: Yes    Alcohol/week: 1.0 standard drink of alcohol    Types: 1 Glasses of wine per week   Drug use: Never    Colonoscopy: yes, 05/2017, normal; Dr. Arlander Bellman   PAP: 03/2017  Bone density: August 11, 2008, showed a T score of -2.1 Mammography: 02/12/2019  Allergies  Allergen Reactions   Fluconazole Anaphylaxis and Other (See  Comments)    Tongue swelling Other reaction(s): Anaphalaxis    Current Outpatient Medications  Medication Sig Dispense Refill   amLODipine (NORVASC) 5 MG tablet Take 5 mg by mouth daily.     anastrozole  (ARIMIDEX ) 1 MG tablet TAKE 1 TABLET BY MOUTH EVERY DAY 90 tablet 2   Ascorbic Acid (VITAMIN C) 1000 MG tablet Take 1,000 mg by mouth daily.     estradiol  (ESTRACE ) 0.1 MG/GM vaginal cream Place 0.5 g vaginally 2 (two) times a week. Place 0.5g nightly for two weeks then twice a week after 42.5 g 11   lactobacillus acidophilus (BACID) TABS tablet Take 1 tablet by mouth 3 (three) times a week. Reported on 12/12/2015     LORazepam (ATIVAN) 0.5 MG tablet Take 0.5 mg by mouth every 6 (  six) hours as needed. PRN     Magnesium 300 MG CAPS Take 1 capsule by mouth in the morning and at bedtime.     multivitamin-lutein (OCUVITE-LUTEIN) CAPS Take 1 capsule by mouth daily.     Omega-3 Fatty Acids (FISH OIL CONCENTRATE PO) Take by mouth daily at 12 noon.     OVER THE COUNTER MEDICATION Vitamin D 3 2000 iu taking in the a.m.     OVER THE COUNTER MEDICATION Flaxseed oil 1000 mg taking daily     Tafluprost (ZIOPTAN OP) Apply to eye.     tolnaftate (FORMULA 3 THE TREATMENT) 1 % external solution      No current facility-administered medications for this visit.    OBJECTIVE:  white woman who appears stated age  Vitals:   05/11/24 0942  BP: (!) 156/80  Pulse: 82  Resp: 17  Temp: 99.7 F (37.6 C)  SpO2: 100%      Body mass index is 29.09 kg/m.   Wt Readings from Last 3 Encounters:  05/11/24 169 lb 8 oz (76.9 kg)  10/28/23 165 lb 9.6 oz (75.1 kg)  10/24/23 165 lb (74.8 kg)   In addition to the vitals above the patient brought me a list of her blood pressures which she takes in the morning most days.  Control is adequate.    ECOG FS:1 - Symptomatic but completely ambulatory  Physical Exam Constitutional:      Appearance: Normal appearance.  Cardiovascular:     Rate and Rhythm: Normal rate  and regular rhythm.  Chest:     Comments: Bilateral breasts inspected.  No palpable masses or regional adenopathy Musculoskeletal:     Cervical back: Normal range of motion and neck supple.  Neurological:     Mental Status: She is alert.      LAB RESULTS:  CMP     Component Value Date/Time   NA 140 05/11/2024 0921   K 4.1 05/11/2024 0921   CL 105 05/11/2024 0921   CO2 28 05/11/2024 0921   GLUCOSE 95 05/11/2024 0921   BUN 18 05/11/2024 0921   CREATININE 0.73 05/11/2024 0921   CREATININE 0.74 12/12/2015 1722   CALCIUM 9.4 05/11/2024 0921   PROT 7.3 05/11/2024 0921   ALBUMIN 4.5 05/11/2024 0921   AST 19 05/11/2024 0921   ALT 14 05/11/2024 0921   ALKPHOS 64 05/11/2024 0921   BILITOT 0.9 05/11/2024 0921   GFRNONAA >60 05/11/2024 0921   GFRNONAA >89 12/12/2015 1722   GFRAA >60 03/21/2020 1143   GFRAA >60 03/03/2019 0820   GFRAA >89 12/12/2015 1722    No results found for: "TOTALPROTELP", "ALBUMINELP", "A1GS", "A2GS", "BETS", "BETA2SER", "GAMS", "MSPIKE", "SPEI"  No results found for: "KPAFRELGTCHN", "LAMBDASER", "KAPLAMBRATIO"  Lab Results  Component Value Date   WBC 5.2 05/11/2024   NEUTROABS 3.3 05/11/2024   HGB 15.0 05/11/2024   HCT 44.6 05/11/2024   MCV 87.3 05/11/2024   PLT 211 05/11/2024   No results found for: "LABCA2"  No components found for: "NFAOZH086"  No results for input(s): "INR" in the last 168 hours.  No results found for: "LABCA2"  No results found for: "VHQ469"  No results found for: "CAN125"  No results found for: "CAN153"  No results found for: "CA2729"  No components found for: "HGQUANT"  No results found for: "CEA1", "CEA" / No results found for: "CEA1", "CEA"   No results found for: "AFPTUMOR"  No results found for: "CHROMOGRNA"  No results found for: "HGBA", "HGBA2QUANT", "HGBFQUANT", "HGBSQUAN" (  Hemoglobinopathy evaluation)   No results found for: "LDH"  No results found for: "IRON", "TIBC", "IRONPCTSAT" (Iron and  TIBC)  No results found for: "FERRITIN"  Urinalysis    Component Value Date/Time   COLORURINE YELLOW 05/29/2009 1120   APPEARANCEUR CLEAR 05/29/2009 1120   LABSPEC 1.015 05/29/2009 1120   PHURINE 7.5 05/29/2009 1120   GLUCOSEU NEGATIVE 05/29/2009 1120   HGBUR NEGATIVE 05/29/2009 1120   BILIRUBINUR neg 09/24/2023 1434   KETONESUR negative 12/12/2015 1735   KETONESUR NEGATIVE 05/29/2009 1120   PROTEINUR Negative 09/24/2023 1434   PROTEINUR NEGATIVE 05/29/2009 1120   UROBILINOGEN 0.2 09/24/2023 1434   UROBILINOGEN 0.2 05/29/2009 1120   NITRITE neg 09/24/2023 1434   NITRITE NEGATIVE 05/29/2009 1120   LEUKOCYTESUR Negative 09/24/2023 1434    STUDIES:  No results found.   ELIGIBLE FOR AVAILABLE RESEARCH PROTOCOL: no   ASSESSMENT: 63 y.o. [place], [place] woman status post right breast upper inner quadrant biopsy 02/22/2019 for a clinical T1b N0, stage IA base of ductal carcinoma, rate 1, estrogen and progesterone receptor positive, HER-2 negative by FISH, with an MIB-1 of 1%  (1) status post right lumpectomy and sentinel lymph node sampling 03/22/2019 for a pT1c pN0, stage IA invasive ductal carcinoma, with negative margins.  (a) a total of 3 sentinel lymph nodes were removed  (2) Oncotype DX score of 14 predicts a risk of recurrence outside the breast in the next 9 years of 4% if the patient's only systemic treatment is antiestrogens for 5 years.  It also predicts no benefit from chemotherapy  (3) adjuvant radiation 05/24/2019 - 07/08/2019  (a) right breast / 50.4 Gy in 28 fractions  (b) boost / 10 Gy in 5 fractions  (4) anastrozole  started 04/02/2019  (a) bone density 10/17/2003 showed a T score of -2.1  (b) bone density 08/11/2008 showed a T score of -2.1  (5) genetics testing 07/19/2019 through the Common Hereditary Gene Panel offered by [facility] found no deleterious mutations in APC, ATM, AXIN2, BARD1, BMPR1A, BRCA1, BRCA2, BRIP1, CDH1, CDK4, CDKN2A (p14ARF), CDKN2A  (p16INK4a), CHEK2, CTNNA1, DICER1, EPCAM (Deletion/duplication testing only), GREM1 (promoter region deletion/duplication testing only), KIT, MEN1, MLH1, MSH2, MSH3, MSH6, MUTYH, NBN, NF1, NHTL1, PALB2, PDGFRA, PMS2, POLD1, POLE, PTEN, RAD50, RAD51C, RAD51D, RNF43, SDHB, SDHC, SDHD, SMAD4, SMARCA4. STK11, TP53, TSC1, TSC2, and VHL.  The following genes were evaluated for sequence changes only: SDHA and HOXB13 c.251G>A variant only.    PLAN:  Assessment and Plan Assessment & Plan Breast cancer Completed five years of anastrozole  for early-stage breast cancer with low recurrence risk. Ovaries nonfunctional, removal irrelevant to outcome from breast cancer stand point. We have discussed that 5 yrs of anti estrogen therapy is adequate in her case. There is an ongoing small risk of recurrence but prolonged anti estrogen therapy in her case may not change this risk. Discussed lifestyle modifications and guidelines against regular alcohol intake. - Discontinue anastrozole  after current supply. - Encourage healthy lifestyle, diet, exercise. - Recommend stress management techniques. - Order contrast-enhanced mammogram for March 2026. Pt has read about this and given her C density breasts, she inquired into this. I think this is very reasonable.  Hyperlipidemia Cholesterol elevated despite healthy lifestyle. Discussed anastrozole 's impact on cholesterol. Plan to monitor post-discontinuation. - Monitor cholesterol levels after discontinuation of anastrozole .  Prolapsed uterus and bladder Underwent hysterectomy and sacrocolpopexy for prolapse. Ovaries removed, irrelevant to breast cancer outcome due to postmenopausal status.  Anxiety Long-standing anxiety exacerbated by trauma and stress. Discussed stress management and  lifestyle modifications. - Encourage stress management techniques: exercise, meditation, family time.  Bone density monitoring Ordered repeat bone density. Now that she will be done  with anastrozole , if her bone density needs intervention, we will forward this to Dr Candi Chafe or Dr Colvin Dec. She was encouraged to check the test is authorized by her insurance before scheduling. She acknowledged.   Time spent: 40 min.   *Total Encounter Time as defined by the Centers for Medicare and Medicaid Services includes, in addition to the face-to-face time of a patient visit (documented in the note above) non-face-to-face time: obtaining and reviewing outside history, ordering and reviewing medications, tests or procedures, care coordination (communications with other health care professionals or caregivers) and documentation in the medical record.

## 2024-05-14 ENCOUNTER — Other Ambulatory Visit: Payer: Self-pay | Admitting: *Deleted

## 2024-05-14 ENCOUNTER — Encounter: Payer: Self-pay | Admitting: *Deleted

## 2024-05-14 ENCOUNTER — Telehealth: Payer: Self-pay | Admitting: *Deleted

## 2024-05-14 DIAGNOSIS — Z170 Estrogen receptor positive status [ER+]: Secondary | ICD-10-CM

## 2024-05-14 DIAGNOSIS — Z803 Family history of malignant neoplasm of breast: Secondary | ICD-10-CM

## 2024-05-14 NOTE — Telephone Encounter (Addendum)
-----   Message from [place] sent at 05/11/2024 10:18 AM EDT ----- Val  Pt wants to do contrast enhanced mammogram annually for surveillance. Can we coordinate this.  Order faxed to [facility] per above

## 2024-07-05 DIAGNOSIS — J019 Acute sinusitis, unspecified: Secondary | ICD-10-CM | POA: Diagnosis not present

## 2024-08-17 ENCOUNTER — Ambulatory Visit: Payer: BC Managed Care – PPO | Admitting: Obstetrics and Gynecology

## 2024-08-20 ENCOUNTER — Encounter: Payer: Self-pay | Admitting: Obstetrics and Gynecology

## 2024-08-20 NOTE — Progress Notes (Signed)
 63 y.o. G71P2012 Married Caucasian female here for annual exam. Would like to discuss MMG, getting a bone density, and estradiol .  Happy to have had prolapse surgery.  Now off Arimidex .    Asking if she should continue with estradiol  cream now that she is off the Arimidex .  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   Up twice a night to void.  Declined use of Myrbetriq .  Reducing caffeine use.   PCP: Dwight Trula SQUIBB, MD   Patient's last menstrual period was 04/05/2016 (approximate).           Sexually active: Yes.    The current method of family planning is post menopausal status, post status hysterectomy.    Menopausal hormone therapy:  Estrace   Exercising: yes, treadmill and walking.   Smoker:  no  OB History  Gravida Para Term Preterm AB Living  3 2 2  1 2   SAB IAB Ectopic Multiple Live Births  1        # Outcome Date GA Lbr Len/2nd Weight Sex Type Anes PTL Lv  3 SAB           2 Term      Vag-Spont     1 Term      Vag-Spont        HEALTH MAINTENANCE: Last 2 paps:  08/08/22 neg HR HPV neg History of abnormal Pap or positive HPV:  no Mammogram:   02/23/24 Breast Density Cat C, BIRADS Cat 2 benign  Colonoscopy:  2018- due 10 years  Bone Density:  08/23/20  Result  osteopenia    Immunization History  Administered Date(s) Administered   Hepatitis B 12/16/1992   Influenza Split 09/15/2012, 09/16/2015   Influenza,inj,Quad PF,6+ Mos 09/21/2013   Influenza-Unspecified 09/15/2014   PFIZER(Purple Top)SARS-COV-2 Vaccination 02/18/2020, 03/15/2020, 11/17/2020   Pfizer(Comirnaty)Fall Seasonal Vaccine 12 years and older 11/21/2023      reports that she has never smoked. She has never used smokeless tobacco. She reports that she does not currently use alcohol after a past usage of about 1.0 standard drink of alcohol per week. She reports that she does not use drugs.  Past Medical History:  Diagnosis Date   Allergy    seasonal   Anxiety    Follows w/ Dr. Dwight @  [place].   Cancer [facility]) 02/2019   right breast IDC, Follows w/ [facility].   Family history of breast cancer    Family history of prostate cancer    Female bladder prolapse    Fibroid    Glaucoma    open angle   Hypertension    Dr. Dwight at [place].   Leg pain, bilateral    improved   Lung nodules    benign   Other and unspecified hyperlipidemia 04/05/2013   borderline per pt   Peripheral vascular disease (HCC)    chronic venous insufficiency bilaterally   Personal history of radiation therapy    05/24/2019 - 07/08/2019 to right breast   PONV (postoperative nausea and vomiting)    Varicose veins of both lower extremities    Wears glasses    for reading    Past Surgical History:  Procedure Laterality Date   BIOPSY THYROID   2019   FNA of thyroid    BLADDER SUSPENSION  2010   Dr.Silva   BREAST CYST ASPIRATION Right 09/08/2012   BREAST LUMPECTOMY Right 03/19/2019   BREAST LUMPECTOMY WITH RADIOACTIVE SEED AND SENTINEL LYMPH NODE BIOPSY Right 03/22/2019   Procedure: RIGHT  BREAST LUMPECTOMY WITH RADIOACTIVE SEED AND RIGHT AXILLARY SENTINEL LYMPH NODE BIOPSY;  Surgeon: Ethyl Lenis, MD;  Location: [facility];  Service: General;  Laterality: Right;   COLONOSCOPY  2018   normal   CYSTOSCOPY N/A 10/28/2023   Procedure: CYSTOSCOPY;  Surgeon: Marilynne Rosaline SAILOR, MD;  Location: [facility];  Service: Gynecology;  Laterality: N/A;   INSERTION OF MESH N/A 11/01/2016   Procedure: INSERTION OF MESH;  Surgeon: Krystal Russell, MD;  Location: [facility] OR;  Service: General;  Laterality: N/A;   PERINEOPLASTY N/A 10/28/2023   Procedure: PERINEOPLASTY;  Surgeon: Marilynne Rosaline SAILOR, MD;  Location: [facility];  Service: Gynecology;  Laterality: N/A;   RHINOPLASTY     in high school   TONSILLECTOMY     VAGINAL PROLAPSE REPAIR  2010   cystocele and rectocele repair   varicose veins     VEIN LIGATION AND STRIPPING  1995   VENTRAL HERNIA  REPAIR N/A 11/01/2016   Procedure: VENTRAL HERNIA REPAIR WITH MESH;  Surgeon: Krystal Russell, MD;  Location: [facility] OR;  Service: General;  Laterality: N/A;   XI ROBOTIC ASSISTED TOTAL HYSTERECTOMY WITH SACROCOLPOPEXY N/A 10/28/2023   Procedure: XI ROBOTIC ASSISTED TOTAL HYSTERECTOMY WITH BILATERAL SALPINGO-OOPHORECTOMY AND  SACROCOLPOPEXY;  Surgeon: Marilynne Rosaline SAILOR, MD;  Location: [facility];  Service: Gynecology;  Laterality: N/A;    Current Outpatient Medications  Medication Sig Dispense Refill   amLODipine (NORVASC) 5 MG tablet Take 5 mg by mouth daily.     Ascorbic Acid (VITAMIN C) 1000 MG tablet Take 1,000 mg by mouth daily.     lactobacillus acidophilus (BACID) TABS tablet Take 1 tablet by mouth 3 (three) times a week. Reported on 12/12/2015     LORazepam (ATIVAN) 0.5 MG tablet Take 0.5 mg by mouth every 6 (six) hours as needed. PRN     Magnesium 300 MG CAPS Take 1 capsule by mouth in the morning and at bedtime.     multivitamin-lutein (OCUVITE-LUTEIN) CAPS Take 1 capsule by mouth daily.     Omega-3 Fatty Acids (FISH OIL CONCENTRATE PO) Take by mouth daily at 12 noon.     OVER THE COUNTER MEDICATION Vitamin D 3 2000 iu taking in the a.m.     OVER THE COUNTER MEDICATION Flaxseed oil 1000 mg taking daily     Tafluprost (ZIOPTAN OP) Apply to eye.     tolnaftate (FORMULA 3 THE TREATMENT) 1 % external solution      anastrozole  (ARIMIDEX ) 1 MG tablet TAKE 1 TABLET BY MOUTH EVERY DAY (Patient not taking: Reported on 08/25/2024) 90 tablet 2   estradiol  (ESTRACE ) 0.1 MG/GM vaginal cream Place 0.5 g vaginally 2 (two) times a week. Place 0.5g nightly for two weeks then twice a week after (Patient not taking: Reported on 08/25/2024) 42.5 g 11   No current facility-administered medications for this visit.    ALLERGIES: Fluconazole  Family History  Problem Relation Age of Onset   Cancer Maternal Grandmother        breast cancer   Breast cancer Maternal Grandmother         unsure of age   Heart disease Maternal Grandfather    Stroke Brother    Alcohol abuse Brother    Liver disease Brother    Varicose Veins Brother    Heart disease Brother    Cancer Mother    Tracheal cancer Mother    Varicose Veins Father    Heart disease Father  Prostate cancer Father    Heart attack Brother    Mental illness Brother        d. 72   Varicose Veins Paternal Grandmother    Prostate cancer Brother        possible, unsure if BPA vs prostate cancer    Review of Systems  All other systems reviewed and are negative.   PHYSICAL EXAM:  BP 124/82 (BP Location: Left Arm, Patient Position: Sitting)   Pulse 84   Ht 5' 4 (1.626 m)   Wt 164 lb (74.4 kg)   LMP 04/05/2016 (Approximate)   SpO2 98%   BMI 28.15 kg/m     General appearance: alert, cooperative and appears stated age Head: normocephalic, without obvious abnormality, atraumatic Neck: no adenopathy, supple, symmetrical, trachea midline and thyroid  normal to inspection and palpation Lungs: clear to auscultation bilaterally Breasts: normal appearance, no masses or tenderness, No nipple retraction or dimpling, No nipple discharge or bleeding, No axillary adenopathy Heart: regular rate and rhythm Abdomen: soft, non-tender; no masses, no organomegaly Extremities: extremities normal, atraumatic, no cyanosis or edema Skin: skin color, texture, turgor normal. No rashes or lesions Lymph nodes: cervical, supraclavicular, and axillary nodes normal. Neurologic: grossly normal  Pelvic: External genitalia:  no lesions              No abnormal inguinal nodes palpated.              Urethra:  normal appearing urethra with no masses, tenderness or lesions              Bartholins and Skenes: normal                 Vagina: normal appearing vagina with normal color and discharge, no lesions.  Good vaginal support.               Cervix: absent              Pap taken: no Bimanual Exam:  Uterus: absent              Adnexa: no  mass, fullness, tenderness              Rectal exam: yes.  Confirms.              Anus:  normal sphincter tone, no lesions  Chaperone was present for exam:  Kari HERO, [name]  ASSESSMENT: Well woman visit with gynecologic exam.  Personal history of other medical treatment.   Right breast cancer.  Status post lumpectomy with sentinal node, and XRT.  Estrogen receptor positive.  Off Arimidex . Negative genetic testing.   Menopausal female.  Status post robotic hysterectomy with BSO, sacrocolpopexy, perineoplasty, cystoscopy 10/2023.  Pathology showed benign cervix, benign endometrium, adenomyosis, benign tubes and ovaries.   Hx of mid-urethral sling.   Vaginal atrophy.   Hx varicose veins.   Osteopenia.  PHQ-2-9: 0  PLAN: Mammogram discussed. Self breast awareness reviewed. Pap and HRV collected:  no.  Not indicated.  Guidelines for Calcium, Vitamin D, regular exercise program including cardiovascular and weight bearing exercise. Medication refills:  NA.  I recommend she stop vaginal estradiol .   Dexa at [facility].  Patient will schedule this.  Vaginal vitamin E suppository 200 international units twice weekly.  #24, RF 3.  [employer] Follow up:  yearly and prn.    Additional counseling given.  yes. 25 min  total time was spent for this patient encounter, including preparation, face-to-face counseling with the patient,  coordination of care, and documentation of the encounter in addition to doing the well woman visit with gynecologic exam.

## 2024-08-25 ENCOUNTER — Encounter: Payer: Self-pay | Admitting: Obstetrics and Gynecology

## 2024-08-25 ENCOUNTER — Ambulatory Visit (INDEPENDENT_AMBULATORY_CARE_PROVIDER_SITE_OTHER): Payer: BC Managed Care – PPO | Admitting: Obstetrics and Gynecology

## 2024-08-25 VITALS — BP 124/82 | HR 84 | Ht 64.0 in | Wt 164.0 lb

## 2024-08-25 DIAGNOSIS — Z1331 Encounter for screening for depression: Secondary | ICD-10-CM

## 2024-08-25 DIAGNOSIS — N952 Postmenopausal atrophic vaginitis: Secondary | ICD-10-CM | POA: Diagnosis not present

## 2024-08-25 DIAGNOSIS — Z780 Asymptomatic menopausal state: Secondary | ICD-10-CM | POA: Diagnosis not present

## 2024-08-25 DIAGNOSIS — Z9289 Personal history of other medical treatment: Secondary | ICD-10-CM

## 2024-08-25 DIAGNOSIS — Z853 Personal history of malignant neoplasm of breast: Secondary | ICD-10-CM

## 2024-08-25 DIAGNOSIS — Z01419 Encounter for gynecological examination (general) (routine) without abnormal findings: Secondary | ICD-10-CM

## 2024-08-25 NOTE — Patient Instructions (Addendum)
 Please call the [facility] to schedule your bone density.   EXERCISE AND DIET:  We recommended that you start or continue a regular exercise program for good health. Regular exercise means any activity that makes your heart beat faster and makes you sweat.  We recommend exercising at least 30 minutes per day at least 3 days a week, preferably 4 or 5.  We also recommend a diet low in fat and sugar.  Inactivity, poor dietary choices and obesity can cause diabetes, heart attack, stroke, and kidney damage, among others.    ALCOHOL AND SMOKING:  Women should limit their alcohol intake to no more than 7 drinks/beers/glasses of wine (combined, not each!) per week. Moderation of alcohol intake to this level decreases your risk of breast cancer and liver damage. And of course, no recreational drugs are part of a healthy lifestyle.  And absolutely no smoking or even second hand smoke. Most people know smoking can cause heart and lung diseases, but did you know it also contributes to weakening of your bones? Aging of your skin?  Yellowing of your teeth and nails?  CALCIUM AND VITAMIN D:  Adequate intake of calcium and Vitamin D are recommended.  The recommendations for exact amounts of these supplements seem to change often, but generally speaking 600 mg of calcium (either carbonate or citrate) and 800 units of Vitamin D per day seems prudent. Certain women may benefit from higher intake of Vitamin D.  If you are among these women, your doctor will have told you during your visit.    PAP SMEARS:  Pap smears, to check for cervical cancer or precancers,  have traditionally been done yearly, although recent scientific advances have shown that most women can have pap smears less often.  However, every woman still should have a physical exam from her gynecologist every year. It will include a breast check, inspection of the vulva and vagina to check for abnormal growths or skin changes, a visual exam of  the cervix, and then an exam to evaluate the size and shape of the uterus and ovaries.  And after 65 years of age, a rectal exam is indicated to check for rectal cancers. We will also provide age appropriate advice regarding health maintenance, like when you should have certain vaccines, screening for sexually transmitted diseases, bone density testing, colonoscopy, mammograms, etc.   MAMMOGRAMS:  All women over 87 years old should have a yearly mammogram. Many facilities now offer a 3D mammogram, which may cost around $50 extra out of pocket. If possible,  we recommend you accept the option to have the 3D mammogram performed.  It both reduces the number of women who will be called back for extra views which then turn out to be normal, and it is better than the routine mammogram at detecting truly abnormal areas.    COLONOSCOPY:  Colonoscopy to screen for colon cancer is recommended for all women at age 54.  We know, you hate the idea of the prep.  We agree, BUT, having colon cancer and not knowing it is worse!!  Colon cancer so often starts as a polyp that can be seen and removed at colonscopy, which can quite literally save your life!  And if your first colonoscopy is normal and you have no family history of colon cancer, most women don't have to have it again for 10 years.  Once every ten years, you can do something that may end up saving your life, right?  We will be happy to help you get it scheduled when you are ready.  Be sure to check your insurance coverage so you understand how much it will cost.  It may be covered as a preventative service at no cost, but you should check your particular policy.

## 2024-08-29 ENCOUNTER — Encounter: Payer: Self-pay | Admitting: Obstetrics and Gynecology

## 2024-08-30 MED ORDER — NONFORMULARY OR COMPOUNDED ITEM: 3 refills | Status: DC

## 2024-08-30 MED ORDER — NONFORMULARY OR COMPOUNDED ITEM: 3 refills | Status: AC

## 2024-08-30 NOTE — Telephone Encounter (Signed)
 Patient was seen on 08/25/24.  Dr. Nikki -please advise on Vit E Rx.

## 2024-08-30 NOTE — Telephone Encounter (Signed)
 Vaginal Vitamin E suppositories phoned into [facility], spoke with pharmacist Lauren. Read back and confirmed Rx per Dr. Nikki.   Rx updated in EPIC.    Patient notified.   Routing to provider for final review. Patient is agreeable to disposition. Will close encounter.

## 2024-11-03 ENCOUNTER — Ambulatory Visit

## 2024-11-03 DIAGNOSIS — Z780 Asymptomatic menopausal state: Secondary | ICD-10-CM

## 2024-11-03 DIAGNOSIS — Z13820 Encounter for screening for osteoporosis: Secondary | ICD-10-CM

## 2024-11-05 ENCOUNTER — Encounter: Payer: Self-pay | Admitting: Obstetrics and Gynecology

## 2024-11-05 ENCOUNTER — Ambulatory Visit: Payer: Self-pay | Admitting: Obstetrics and Gynecology

## 2024-11-08 ENCOUNTER — Other Ambulatory Visit: Payer: Self-pay | Admitting: Obstetrics and Gynecology

## 2024-11-08 DIAGNOSIS — M810 Age-related osteoporosis without current pathological fracture: Secondary | ICD-10-CM

## 2024-11-16 NOTE — Progress Notes (Unsigned)
 Office Visit Note  Patient: Rhonda [name]             Date of Birth: 12-11-1961           MRN: [id number]             PCP: Dwight Trula SQUIBB, MD Referring: Cathlyn JAYSON Nikki Bobie* Visit Date: 11/17/2024 Occupation: Data Unavailable  Subjective:  No chief complaint on file.   History of Present Illness: Rhonda [name] is a 63 y.o. female ***     Activities of Daily Living:  Patient reports morning stiffness for *** {minute/hour:19697}.   Patient {ACTIONS;DENIES/REPORTS:21021675::Denies} nocturnal pain.  Difficulty dressing/grooming: {ACTIONS;DENIES/REPORTS:21021675::Denies} Difficulty climbing stairs: {ACTIONS;DENIES/REPORTS:21021675::Denies} Difficulty getting out of chair: {ACTIONS;DENIES/REPORTS:21021675::Denies} Difficulty using hands for taps, buttons, cutlery, and/or writing: {ACTIONS;DENIES/REPORTS:21021675::Denies}  No Rheumatology ROS completed.   PMFS History:  Patient Active Problem List   Diagnosis Date Noted   Uterovaginal prolapse, incomplete 10/28/2023   Genetic testing 07/19/2019   Family history of breast cancer    Family history of prostate cancer    Osteopenia 06/07/2019   Malignant neoplasm of upper-inner quadrant of right breast in female, estrogen receptor positive (HCC) 03/01/2019   DUB (dysfunctional uterine bleeding) 09/21/2013   Anxiety    Other and unspecified hyperlipidemia 04/05/2013    Past Medical History:  Diagnosis Date   Allergy    seasonal   Anxiety    Follows w/ Dr. Dwight @ [place].   Cancer [facility]) 02/2019   right breast IDC, Follows w/ [facility].   Family history of breast cancer    Family history of prostate cancer    Female bladder prolapse    Fibroid    Glaucoma    open angle   Hypertension    Dr. Dwight at [place].   Leg pain, bilateral    improved   Lung nodules    benign   Osteoporosis 2025   Other and unspecified hyperlipidemia 04/05/2013   borderline per pt   Peripheral vascular disease     chronic venous insufficiency bilaterally   Personal history of radiation therapy    05/24/2019 - 07/08/2019 to right breast   PONV (postoperative nausea and vomiting)    Varicose veins of both lower extremities    Wears glasses    for reading    Family History  Problem Relation Age of Onset   Cancer Maternal Grandmother        breast cancer   Breast cancer Maternal Grandmother        unsure of age   Heart disease Maternal Grandfather    Stroke Brother    Alcohol abuse Brother    Liver disease Brother    Varicose Veins Brother    Heart disease Brother    Cancer Mother    Tracheal cancer Mother    Varicose Veins Father    Heart disease Father    Prostate cancer Father    Heart attack Brother    Mental illness Brother        d. 5   Varicose Veins Paternal Grandmother    Prostate cancer Brother        possible, unsure if BPA vs prostate cancer   Past Surgical History:  Procedure Laterality Date   BIOPSY THYROID   2019   FNA of thyroid    BLADDER SUSPENSION  2010   Dr.Silva   BREAST CYST ASPIRATION Right 09/08/2012   BREAST LUMPECTOMY Right 03/19/2019   BREAST LUMPECTOMY WITH RADIOACTIVE SEED AND SENTINEL LYMPH NODE  BIOPSY Right 03/22/2019   Procedure: RIGHT BREAST LUMPECTOMY WITH RADIOACTIVE SEED AND RIGHT AXILLARY SENTINEL LYMPH NODE BIOPSY;  Surgeon: Ethyl Lenis, MD;  Location: [facility];  Service: General;  Laterality: Right;   COLONOSCOPY  2018   normal   CYSTOSCOPY N/A 10/28/2023   Procedure: CYSTOSCOPY;  Surgeon: Marilynne Rosaline SAILOR, MD;  Location: [facility];  Service: Gynecology;  Laterality: N/A;   INSERTION OF MESH N/A 11/01/2016   Procedure: INSERTION OF MESH;  Surgeon: Krystal Russell, MD;  Location: [facility] OR;  Service: General;  Laterality: N/A;   PERINEOPLASTY N/A 10/28/2023   Procedure: PERINEOPLASTY;  Surgeon: Marilynne Rosaline SAILOR, MD;  Location: [facility];  Service: Gynecology;  Laterality: N/A;    RHINOPLASTY     in high school   TONSILLECTOMY     VAGINAL PROLAPSE REPAIR  2010   cystocele and rectocele repair   varicose veins     VEIN LIGATION AND STRIPPING  1995   VENTRAL HERNIA REPAIR N/A 11/01/2016   Procedure: VENTRAL HERNIA REPAIR WITH MESH;  Surgeon: Krystal Russell, MD;  Location: [facility] OR;  Service: General;  Laterality: N/A;   XI ROBOTIC ASSISTED TOTAL HYSTERECTOMY WITH SACROCOLPOPEXY N/A 10/28/2023   Procedure: XI ROBOTIC ASSISTED TOTAL HYSTERECTOMY WITH BILATERAL SALPINGO-OOPHORECTOMY AND  SACROCOLPOPEXY;  Surgeon: Marilynne Rosaline SAILOR, MD;  Location: [facility];  Service: Gynecology;  Laterality: N/A;   Social History   Tobacco Use   Smoking status: Never   Smokeless tobacco: Never  Vaping Use   Vaping status: Never Used  Substance Use Topics   Alcohol use: Not Currently    Alcohol/week: 1.0 standard drink of alcohol    Types: 1 Glasses of wine per week   Drug use: Never   Social History   Social History Narrative   Married. Education: college. Exercise: walk for 1 hour everyday.     Immunization History  Administered Date(s) Administered   Hepatitis B 12/16/1992   Influenza Split 09/15/2012, 09/16/2015   Influenza,inj,Quad PF,6+ Mos 09/21/2013   Influenza-Unspecified 09/15/2014   PFIZER(Purple Top)SARS-COV-2 Vaccination 02/18/2020, 03/15/2020, 11/17/2020   Pfizer(Comirnaty)Fall Seasonal Vaccine 12 years and older 11/21/2023     Objective: Vital Signs: LMP 04/05/2016 (Approximate)    Physical Exam   Musculoskeletal Exam: ***  CDAI Exam: CDAI Score: -- Patient Global: --; Provider Global: -- Swollen: --; Tender: -- Joint Exam 11/17/2024   No joint exam has been documented for this visit   There is currently no information documented on the homunculus. Go to the Rheumatology activity and complete the homunculus joint exam.  Investigation: No additional findings.  Imaging: DG Bone Density Result Date: 11/03/2024 EXAM: DUAL  X-RAY ABSORPTIOMETRY (DXA) FOR BONE MINERAL DENSITY 11/03/2024 1:31 pm CLINICAL DATA:  63 year old Female Postmenopausal. Menopause Patient is or has been on bone building therapies. TECHNIQUE: An axial (e.g., hips, spine) and/or appendicular (e.g., radius) exam was performed, as appropriate, using GE Secretary/administrator at [employer]. Images are obtained for bone mineral density measurement and are not obtained for diagnostic purposes. [id number] Exclusions: None. COMPARISON:  None. New baseline. FINDINGS: Scan quality: Good. LUMBAR SPINE (L1-L4): BMD (in g/cm2): 0.710 T-score: -3.9 Z-score: -2.3 LEFT FEMORAL NECK: BMD (in g/cm2): 0.666 T-score: -2.7 Z-score: -1.2 LEFT TOTAL HIP: BMD (in g/cm2): 0.728 T-score: -2.2 Z-score: -1.0 RIGHT FEMORAL NECK: BMD (in g/cm2): 0.652 T-score: -2.8 Z-score: -1.3 RIGHT TOTAL HIP: BMD (in g/cm2): 0.679 T-score: -2.6 Z-score: -1.4 LEFT FOREARM (RADIUS 33%): BMD (in  g/cm2): 0.485 T-score: -4.5 Z-score: -3.1 FRAX 10-YEAR PROBABILITY OF FRACTURE: FRAX not reported as the lowest BMD is not in the osteopenia range. IMPRESSION: Osteoporosis based on BMD. Fracture risk is unknown due to history of bone building therapy. RECOMMENDATIONS: 1. All patients should optimize calcium and vitamin D intake. 2. Consider FDA-approved medical therapies in postmenopausal women and men aged 70 years and older, based on the following: - A hip or vertebral (clinical or morphometric) fracture - T-score less than or equal to -2.5 and secondary causes have been excluded. - Low bone mass (T-score between -1.0 and -2.5) and a 10-year probability of a hip fracture greater than or equal to 3% or a 10-year probability of a major osteoporosis-related fracture greater than or equal to 20% based on the [place] -adapted WHO algorithm. - Clinician judgment and/or patient preferences may indicate treatment for people with 10-year fracture probabilities above or below these levels 3. Patients with  diagnosis of osteoporosis or at high risk for fracture should have regular bone mineral density tests. For patients eligible for Medicare, routine testing is allowed once every 2 years. The testing frequency can be increased to one year for patients who have rapidly progressing disease, those who are receiving or discontinuing medical therapy to restore bone mass, or have additional risk factors. Electronically Signed   By: Dina  Arceo M.D.   On: 11/03/2024 13:52    Recent Labs: Lab Results  Component Value Date   WBC 5.2 05/11/2024   HGB 15.0 05/11/2024   PLT 211 05/11/2024   NA 140 05/11/2024   K 4.1 05/11/2024   CL 105 05/11/2024   CO2 28 05/11/2024   GLUCOSE 95 05/11/2024   BUN 18 05/11/2024   CREATININE 0.73 05/11/2024   BILITOT 0.9 05/11/2024   ALKPHOS 64 05/11/2024   AST 19 05/11/2024   ALT 14 05/11/2024   PROT 7.3 05/11/2024   ALBUMIN 4.5 05/11/2024   CALCIUM 9.4 05/11/2024   GFRAA >60 03/21/2020    Speciality Comments: No specialty comments available.  Procedures:  No procedures performed Allergies: Fluconazole   Assessment / Plan:     Visit Diagnoses: No diagnosis found.  Orders: No orders of the defined types were placed in this encounter.  No orders of the defined types were placed in this encounter.   Face-to-face time spent with patient was *** minutes. Greater than 50% of time was spent in counseling and coordination of care.  Follow-Up Instructions: No follow-ups on file.   Maya Nash, MD  Note - This record has been created using Animal nutritionist.  Chart creation errors have been sought, but may not always  have been located. Such creation errors do not reflect on  the standard of medical care.

## 2024-11-17 ENCOUNTER — Ambulatory Visit: Attending: Rheumatology | Admitting: Rheumatology

## 2024-11-17 ENCOUNTER — Encounter: Payer: Self-pay | Admitting: Rheumatology

## 2024-11-17 VITALS — BP 160/92 | HR 93 | Temp 97.3°F | Resp 15 | Ht 64.5 in | Wt 163.8 lb

## 2024-11-17 DIAGNOSIS — M810 Age-related osteoporosis without current pathological fracture: Secondary | ICD-10-CM | POA: Diagnosis not present

## 2024-11-17 DIAGNOSIS — Z170 Estrogen receptor positive status [ER+]: Secondary | ICD-10-CM

## 2024-11-17 DIAGNOSIS — E782 Mixed hyperlipidemia: Secondary | ICD-10-CM | POA: Diagnosis not present

## 2024-11-17 DIAGNOSIS — E559 Vitamin D deficiency, unspecified: Secondary | ICD-10-CM

## 2024-11-17 DIAGNOSIS — F419 Anxiety disorder, unspecified: Secondary | ICD-10-CM

## 2024-11-17 DIAGNOSIS — C50211 Malignant neoplasm of upper-inner quadrant of right female breast: Secondary | ICD-10-CM

## 2024-11-17 DIAGNOSIS — R5383 Other fatigue: Secondary | ICD-10-CM

## 2024-11-17 NOTE — Telephone Encounter (Signed)
 Patient states she has seen Dr Dolphus. She wanted to know if we offer Evenity in our office. I told her we did.  She said Dr Nikki wanted to see her before she went to see Dr Dolphus. Her appointment was for may with Dr Dolphus but it was moved up, so she just saw her. Patient states she will still keep her appt with Dr Nikki for 12-21-24. She said Dr Dolphus told her she needed to also take a supplement instead of just in her foods.  She said she will get in 1200mg  of calcium but will try to get it in her foods. She wants to know which calcium she should take for example calcium citrates? Please advise.

## 2024-11-17 NOTE — Patient Instructions (Addendum)
 Romosozumab Injection What is this medication? ROMOSOZUMAB (roe moe SOZ ue mab) prevents and treats osteoporosis. It works by interior and spatial designer stronger and less likely to break (fracture). It is a monoclonal antibody. This medicine may be used for other purposes; ask your health care provider or pharmacist if you have questions. COMMON BRAND NAME(S): EVENITY What should I tell my care team before I take this medication? They need to know if you have any of these conditions: Dental disease Heart attack Heart disease Kidney problems Low levels of calcium in the blood On dialysis Stroke Wear dentures An unusual or allergic reaction to romosozumab, other medications, foods, dyes or preservatives Pregnant or trying to get pregnant Breast-feeding How should I use this medication? This medication is injected under the skin. It is given by your care team in a hospital or clinic setting. A special MedGuide will be given to you by the pharmacist with each prescription and refill. Be sure to read this information carefully each time. Talk to your care team about the use of this medication in children. Special care may be needed. Overdosage: If you think you have taken too much of this medicine contact a poison control center or emergency room at once. NOTE: This medicine is only for you. Do not share this medicine with others. What if I miss a dose? Keep appointments for follow-up doses. It is important not to miss your dose. Call your care team if you are unable to keep an appointment. What may interact with this medication? Interactions are not expected. This list may not describe all possible interactions. Give your health care provider a list of all the medicines, herbs, non-prescription drugs, or dietary supplements you use. Also tell them if you smoke, drink alcohol, or use illegal drugs. Some items may interact with your medicine. What should I watch for while using this medication? Your  condition will be monitored carefully while you are receiving this medication. You may need bloodwork while taking this medication. You should make sure you get enough calcium and vitamin D while you are taking this medication. Discuss the foods you eat and the vitamins you take with your care team. Some people who take this medication have severe bone, joint, or muscle pain. This medication may also increase your risk for jaw problems or a broken thigh bone. Tell your care team right away if you have severe pain in your jaw, bones, joints, or muscles. Tell you care team if you have any pain that does not go away or that gets worse. Tell your dentist and dental surgeon that you are taking this medication. You should not have major dental surgery while on this medication. See your dentist to have a dental exam and fix any dental problems before starting this medication. Take good care of your teeth while on this medication. Make sure you see your dentist for regular follow-up appointments. What side effects may I notice from receiving this medication? Side effects that you should report to your care team as soon as possible: Allergic reactions or angioedema--skin rash, itching or hives, swelling of the face, eyes, lips, tongue, arms, or legs, trouble swallowing or breathing Heart attack--pain or tightness in the chest, shoulders, arms, or jaw, nausea, shortness of breath, cold or clammy skin, feeling faint or lightheaded Low calcium level--muscle pain or cramps, confusion, tingling, or numbness in the hands or feet Osteonecrosis of the jaw--pain, swelling, or redness in the mouth, numbness of the jaw, poor healing after dental work,  unusual discharge from the mouth, visible bones in the mouth Severe bone, joint, or muscle pain Stroke--sudden numbness or weakness of the face, arm, or leg, trouble speaking, confusion, trouble walking, loss of balance or coordination, dizziness, severe headache, change in  vision Side effects that usually do not require medical attention (report to your care team if they continue or are bothersome): Headache Joint pain Muscle spasms Pain, redness, or irritation at injection site Swelling of the ankles, hands, or feet This list may not describe all possible side effects. Call your doctor for medical advice about side effects. You may report side effects to FDA at 1-800-FDA-1088. Where should I keep my medication? This medication is given in a hospital or clinic. It will not be stored at home. NOTE: This sheet is a summary. It may not cover all possible information. If you have questions about this medicine, talk to your doctor, pharmacist, or health care provider.  2024 Elsevier/Gold Standard (2022-01-02 00:00:00) Zoledronic Acid Injection (Bone Disorders) What is this medication? ZOLEDRONIC ACID (ZOE le dron ik AS id) prevents and treats osteoporosis. It may also be used to treat Paget's disease of the bone. It works by interior and spatial designer stronger and less likely to break (fracture). It belongs to a group of medications called bisphosphonates. This medicine may be used for other purposes; ask your health care provider or pharmacist if you have questions. COMMON BRAND NAME(S): Reclast What should I tell my care team before I take this medication? They need to know if you have any of these conditions: Bleeding disorder Cancer Dental disease Kidney disease Low levels of calcium in the blood Low red blood cell counts Lung or breathing disease, such as asthma Receiving steroids, such as dexamethasone  or prednisone An unusual or allergic reaction to zoledronic acid, other medications, foods, dyes, or preservatives Pregnant or trying to get pregnant Breast-feeding How should I use this medication? This medication is injected into a vein. It is given by your care team in a hospital or clinic setting. A special MedGuide will be given to you before each  treatment. Be sure to read this information carefully each time. Talk to your care team about the use of this medication in children. Special care may be needed. Overdosage: If you think you have taken too much of this medicine contact a poison control center or emergency room at once. NOTE: This medicine is only for you. Do not share this medicine with others. What if I miss a dose? Keep appointments for follow-up doses. It is important not to miss your dose. Call your care team if you are unable to keep an appointment. What may interact with this medication? Certain antibiotics given by injection Medications for pain and inflammation, such as ibuprofen , naproxen, NSAIDs Some diuretics, such as bumetanide, furosemide Teriparatide This list may not describe all possible interactions. Give your health care provider a list of all the medicines, herbs, non-prescription drugs, or dietary supplements you use. Also tell them if you smoke, drink alcohol, or use illegal drugs. Some items may interact with your medicine. What should I watch for while using this medication? Visit your care team for regular checks on your progress. It may be some time before you see the benefit from this medication. Some people who take this medication have severe bone, joint, or muscle pain. This medication may also increase your risk for jaw problems or a broken thigh bone. Tell your care team right away if you have severe pain in your  jaw, bones, joints, or muscles. Tell your care team if you have any pain that does not go away or that gets worse. You should make sure you get enough calcium and vitamin D while you are taking this medication. Discuss the foods you eat and the vitamins you take with your care team. You may need bloodwork while taking this medication. Tell your dentist and dental surgeon that you are taking this medication. You should not have major dental surgery while on this medication. See your dentist to  have a dental exam and fix any dental problems before starting this medication. Take good care of your teeth while on this medication. Make sure you see your dentist for regular follow-up appointments. What side effects may I notice from receiving this medication? Side effects that you should report to your care team as soon as possible: Allergic reactions--skin rash, itching, hives, swelling of the face, lips, tongue, or throat Kidney injury--decrease in the amount of urine, swelling of the ankles, hands, or feet Low calcium level--muscle pain or cramps, confusion, tingling, or numbness in the hands or feet Osteonecrosis of the jaw--pain, swelling, or redness in the mouth, numbness of the jaw, poor healing after dental work, unusual discharge from the mouth, visible bones in the mouth Severe bone, joint, or muscle pain Side effects that usually do not require medical attention (report to your care team if they continue or are bothersome): Diarrhea Dizziness Headache Nausea Stomach pain Vomiting This list may not describe all possible side effects. Call your doctor for medical advice about side effects. You may report side effects to FDA at 1-800-FDA-1088. Where should I keep my medication? This medication is given in a hospital or clinic. It will not be stored at home. NOTE: This sheet is a summary. It may not cover all possible information. If you have questions about this medicine, talk to your doctor, pharmacist, or health care provider.  2024 Elsevier/Gold Standard (2022-01-18 00:00:00)

## 2024-11-18 ENCOUNTER — Telehealth: Payer: Self-pay

## 2024-11-18 NOTE — Telephone Encounter (Signed)
 [facility] called and stated Rhonda [name] called and asked them if she could get a vitamin e rx cream to use on the days she is not using the vitamin e suppository. [facility] told her she would call us . Please advise.

## 2024-11-19 ENCOUNTER — Ambulatory Visit: Payer: Self-pay | Admitting: Rheumatology

## 2024-11-19 LAB — PROTEIN ELECTROPHORESIS, SERUM, WITH REFLEX
Albumin ELP: 4.4 g/dL (ref 3.8–4.8)
Alpha 1: 0.3 g/dL (ref 0.2–0.3)
Alpha 2: 0.7 g/dL (ref 0.5–0.9)
Beta 2: 0.4 g/dL (ref 0.2–0.5)
Beta Globulin: 0.4 g/dL (ref 0.4–0.6)
Gamma Globulin: 0.8 g/dL (ref 0.8–1.7)
Total Protein: 7 g/dL (ref 6.1–8.1)

## 2024-11-19 LAB — GLIADIN ANTIBODIES, SERUM
Deamidated Gliadin Abs, IgG: <1 U/mL
Gliadin IgA: <1 U/mL

## 2024-11-19 LAB — CBC WITH DIFFERENTIAL/PLATELET
Absolute Lymphocytes: 1122 {cells}/uL (ref 850–3900)
Absolute Monocytes: 469 {cells}/uL (ref 200–950)
Basophils Absolute: 20 {cells}/uL (ref 0–200)
Basophils Relative: 0.3 %
Eosinophils Absolute: 61 {cells}/uL (ref 15–500)
Eosinophils Relative: 0.9 %
HCT: 47.5 % — ABNORMAL HIGH (ref 35.9–46.0)
Hemoglobin: 15.5 g/dL (ref 11.7–15.5)
MCH: 29.3 pg (ref 27.0–33.0)
MCHC: 32.6 g/dL (ref 31.6–35.4)
MCV: 89.8 fL (ref 81.4–101.7)
MPV: 10.9 fL (ref 7.5–12.5)
Monocytes Relative: 6.9 %
Neutro Abs: 5127 {cells}/uL (ref 1500–7800)
Neutrophils Relative %: 75.4 %
Platelets: 247 Thousand/uL (ref 140–400)
RBC: 5.29 Million/uL — ABNORMAL HIGH (ref 3.80–5.10)
RDW: 11.9 % (ref 11.0–15.0)
Total Lymphocyte: 16.5 %
WBC: 6.8 Thousand/uL (ref 3.8–10.8)

## 2024-11-19 LAB — COMPREHENSIVE METABOLIC PANEL WITH GFR
AG Ratio: 1.8 (calc) (ref 1.0–2.5)
ALT: 21 U/L (ref 6–29)
AST: 25 U/L (ref 10–35)
Albumin: 4.6 g/dL (ref 3.6–5.1)
Alkaline phosphatase (APISO): 65 U/L (ref 37–153)
BUN: 23 mg/dL (ref 7–25)
CO2: 26 mmol/L (ref 20–32)
Calcium: 9.5 mg/dL (ref 8.6–10.4)
Chloride: 103 mmol/L (ref 98–110)
Creat: 0.64 mg/dL (ref 0.50–1.05)
Globulin: 2.5 g/dL (ref 1.9–3.7)
Glucose, Bld: 90 mg/dL (ref 65–99)
Potassium: 4.4 mmol/L (ref 3.5–5.3)
Sodium: 139 mmol/L (ref 135–146)
Total Bilirubin: 0.6 mg/dL (ref 0.2–1.2)
Total Protein: 7.1 g/dL (ref 6.1–8.1)
eGFR: 98 mL/min/1.73m2 (ref >=60)

## 2024-11-19 LAB — TISSUE TRANSGLUTAMINASE, IGA: (tTG) Ab, IgA: <1 U/mL

## 2024-11-19 LAB — TSH: TSH: 1.12 m[IU]/L (ref 0.40–4.50)

## 2024-11-19 LAB — PARATHYROID HORMONE, INTACT (NO CA): PTH: 34 pg/mL (ref 16–77)

## 2024-11-19 LAB — SEDIMENTATION RATE: Sed Rate: 2 mm/h (ref 0–30)

## 2024-11-19 LAB — VITAMIN D 25 HYDROXY (VIT D DEFICIENCY, FRACTURES): Vit D, 25-Hydroxy: 57 ng/mL (ref 30–100)

## 2024-11-19 LAB — PHOSPHORUS: Phosphorus: 4 mg/dL (ref 2.1–4.3)

## 2024-11-19 NOTE — Progress Notes (Signed)
 CBC and CMP are normal.  Vitamin D  normal.  Sed rate normal, PTH normal, phosphorus normal, celiac antibodies normal.  SPEP pending.  I will discuss results at the follow-up visit.

## 2024-11-20 NOTE — Telephone Encounter (Signed)
 Please let patient know that she can buy an over the counter option called Good Clean Love that has vitamin E in it.   This can be used for her personal comfort and for sexual activity as a lubricant.

## 2024-11-21 NOTE — Progress Notes (Signed)
 SPEP  normal.

## 2024-11-22 NOTE — Telephone Encounter (Signed)
 Call placed to patient in f/u to 11/18/24 telephone encounter. Patient mentioned MyChart encounter as seen below. Requesting referral request as seen below. Advised I will forward to Dr. Nikki and our office will f/u with any additional recommendations. Patient states she will plan to further discuss during 12/21/24 visit as well.

## 2024-11-22 NOTE — Telephone Encounter (Signed)
 Patient notified. Encounter closed

## 2024-11-23 NOTE — Telephone Encounter (Signed)
 Referral placed.

## 2024-12-08 NOTE — Progress Notes (Deleted)
 "  Office Visit Note  Patient: Rhonda [name]             Date of Birth: 23-Dec-1960           MRN: [id number]             PCP: Dwight Trula SQUIBB, MD Referring: Dwight Trula SQUIBB, MD Visit Date: 12/22/2024 Occupation: Data Unavailable  Subjective:  No chief complaint on file.   History of Present Illness: Rhonda [name] is a 63 y.o. female ***     Activities of Daily Living:  Patient reports morning stiffness for *** {minute/hour:19697}.   Patient {ACTIONS;DENIES/REPORTS:21021675::Denies} nocturnal pain.  Difficulty dressing/grooming: {ACTIONS;DENIES/REPORTS:21021675::Denies} Difficulty climbing stairs: {ACTIONS;DENIES/REPORTS:21021675::Denies} Difficulty getting out of chair: {ACTIONS;DENIES/REPORTS:21021675::Denies} Difficulty using hands for taps, buttons, cutlery, and/or writing: {ACTIONS;DENIES/REPORTS:21021675::Denies}  No Rheumatology ROS completed.   PMFS History:  Patient Active Problem List   Diagnosis Date Noted   Uterovaginal prolapse, incomplete 10/28/2023   Genetic testing 07/19/2019   Family history of breast cancer    Family history of prostate cancer    Osteopenia 06/07/2019   Malignant neoplasm of upper-inner quadrant of right breast in female, estrogen receptor positive (HCC) 03/01/2019   DUB (dysfunctional uterine bleeding) 09/21/2013   Anxiety    Other and unspecified hyperlipidemia 04/05/2013    Past Medical History:  Diagnosis Date   Allergy    seasonal   Anxiety    Follows w/ Dr. Dwight @ [place].   Cancer [facility]) 02/2019   right breast IDC, Follows w/ [facility].   Family history of breast cancer    Family history of prostate cancer    Female bladder prolapse    Fibroid    Glaucoma    open angle   Hypertension    Dr. Dwight at [place].   Leg pain, bilateral    improved   Lung nodules    benign   Osteoporosis 2025   Other and unspecified hyperlipidemia 04/05/2013   borderline per pt   Peripheral vascular disease    chronic  venous insufficiency bilaterally   Personal history of radiation therapy    05/24/2019 - 07/08/2019 to right breast   PONV (postoperative nausea and vomiting)    Varicose veins of both lower extremities    Wears glasses    for reading    Family History  Problem Relation Age of Onset   Cancer Mother    Tracheal cancer Mother    Varicose Veins Father    Heart disease Father    Prostate cancer Father    Stroke Brother    Alcohol abuse Brother    Liver disease Brother    Varicose Veins Brother    Heart disease Brother    Heart attack Brother    Mental illness Brother        d. 67   Schizophrenia Brother    Prostate cancer Brother        possible, unsure if BPA vs prostate cancer   Stroke Brother    Obesity Brother    Cancer Maternal Grandmother        breast cancer   Breast cancer Maternal Grandmother        unsure of age   Heart disease Maternal Grandfather    Varicose Veins Paternal Grandmother    Healthy Son    Healthy Son    Past Surgical History:  Procedure Laterality Date   BIOPSY THYROID   2019   FNA of thyroid    BLADDER SUSPENSION  2010  Dr.Silva   BREAST CYST ASPIRATION Right 09/08/2012   BREAST LUMPECTOMY Right 03/19/2019   BREAST LUMPECTOMY WITH RADIOACTIVE SEED AND SENTINEL LYMPH NODE BIOPSY Right 03/22/2019   Procedure: RIGHT BREAST LUMPECTOMY WITH RADIOACTIVE SEED AND RIGHT AXILLARY SENTINEL LYMPH NODE BIOPSY;  Surgeon: Ethyl Lenis, MD;  Location: [facility];  Service: General;  Laterality: Right;   COLONOSCOPY  2018   normal   CYSTOSCOPY N/A 10/28/2023   Procedure: CYSTOSCOPY;  Surgeon: Marilynne Rosaline SAILOR, MD;  Location: [facility];  Service: Gynecology;  Laterality: N/A;   INSERTION OF MESH N/A 11/01/2016   Procedure: INSERTION OF MESH;  Surgeon: Krystal Russell, MD;  Location: [facility] OR;  Service: General;  Laterality: N/A;   PERINEOPLASTY N/A 10/28/2023   Procedure: PERINEOPLASTY;  Surgeon: Marilynne Rosaline SAILOR, MD;   Location: [facility];  Service: Gynecology;  Laterality: N/A;   RHINOPLASTY     in high school   TONSILLECTOMY     VAGINAL PROLAPSE REPAIR  2010   cystocele and rectocele repair   varicose veins     VEIN LIGATION AND STRIPPING  1995   VENTRAL HERNIA REPAIR N/A 11/01/2016   Procedure: VENTRAL HERNIA REPAIR WITH MESH;  Surgeon: Krystal Russell, MD;  Location[facility];  Service: General;  Laterality: N/A;   XI ROBOTIC ASSISTED TOTAL HYSTERECTOMY WITH SACROCOLPOPEXY N/A 10/28/2023   Procedure: XI ROBOTIC ASSISTED TOTAL HYSTERECTOMY WITH BILATERAL SALPINGO-OOPHORECTOMY AND  SACROCOLPOPEXY;  Surgeon: Marilynne Rosaline SAILOR, MD;  Location: [facility];  Service: Gynecology;  Laterality: N/A;   Social History[1] Social History   Social History Narrative   Married. Education: college. Exercise: walk for 1 hour everyday.     Immunization History  Administered Date(s) Administered   Hepatitis B 12/16/1992   Influenza Split 09/15/2012, 09/16/2015   Influenza,inj,Quad PF,6+ Mos 09/21/2013   Influenza-Unspecified 09/15/2014   PFIZER(Purple Top)SARS-COV-2 Vaccination 02/18/2020, 03/15/2020, 11/17/2020   Pfizer(Comirnaty)Fall Seasonal Vaccine 12 years and older 11/21/2023     Objective: Vital Signs: LMP 04/05/2016    Physical Exam   Musculoskeletal Exam: ***  CDAI Exam: CDAI Score: -- Patient Global: --; Provider Global: -- Swollen: --; Tender: -- Joint Exam 12/22/2024   No joint exam has been documented for this visit   There is currently no information documented on the homunculus. Go to the Rheumatology activity and complete the homunculus joint exam.  Investigation: No additional findings.  Imaging: No results found.  Recent Labs: Lab Results  Component Value Date   WBC 6.8 11/17/2024   HGB 15.5 11/17/2024   PLT 247 11/17/2024   NA 139 11/17/2024   K 4.4 11/17/2024   CL 103 11/17/2024   CO2 26 11/17/2024   GLUCOSE 90 11/17/2024   BUN 23  11/17/2024   CREATININE 0.64 11/17/2024   BILITOT 0.6 11/17/2024   ALKPHOS 64 05/11/2024   AST 25 11/17/2024   ALT 21 11/17/2024   PROT 7.1 11/17/2024   PROT 7.0 11/17/2024   ALBUMIN 4.5 05/11/2024   CALCIUM 9.5 11/17/2024   GFRAA >60 03/21/2020   December 14, 2024 SPEP normal, antigliadin and TTG antibody negative, TSH normal, vitamin D  57, sed rate 2, PTH normal, phosphorus normal  Speciality Comments: No specialty comments available.  Procedures:  No procedures performed Allergies: Fluconazole   Assessment / Plan:     Visit Diagnoses: No diagnosis found.  Orders: No orders of the defined types were placed in this encounter.  No orders of the defined types were placed in  this encounter.   Face-to-face time spent with patient was *** minutes. Greater than 50% of time was spent in counseling and coordination of care.  Follow-Up Instructions: No follow-ups on file.   Maya Nash, MD  Note - This record has been created using Animal nutritionist.  Chart creation errors have been sought, but may not always  have been located. Such creation errors do not reflect on  the standard of medical care.    [1]  Social History Tobacco Use   Smoking status: Never    Passive exposure: Past   Smokeless tobacco: Never  Vaping Use   Vaping status: Never Used  Substance Use Topics   Alcohol use: Not Currently   Drug use: Never   "

## 2024-12-21 ENCOUNTER — Encounter: Payer: Self-pay | Admitting: Obstetrics and Gynecology

## 2024-12-21 ENCOUNTER — Ambulatory Visit: Admitting: Obstetrics and Gynecology

## 2024-12-21 VITALS — BP 126/82 | HR 88

## 2024-12-21 DIAGNOSIS — M810 Age-related osteoporosis without current pathological fracture: Secondary | ICD-10-CM | POA: Diagnosis not present

## 2024-12-21 NOTE — Progress Notes (Unsigned)
 "  GYNECOLOGY  VISIT   HPI: 64 y.o.   Married  Caucasian female   [phone] with Patient's last menstrual period was 04/05/2016.   here for: Osteoporosis treatment discussion. Scared of risk from [place] because of heart attack and stoke warnings in family hx      Took Arimidex  and finished last dose in June.    Member of [employer].    Saw Dr. Lavonia and had normal blood work.    FH sister with osteoporosis.    Has appt with Dr. Allen at [facility] for osteoporosis consultation.  Three brothers died of cardiovascular disease. One died of stroke, one of MI, and one of cardiomyopathy.  Father also had a few MIs.    GYNECOLOGIC HISTORY: Patient's last menstrual period was 04/05/2016. Contraception:  Hyst Menopausal hormone therapy:  n/a Last 2 paps:  08/08/22 neg HR HPV neg  History of abnormal Pap or positive HPV:  no Mammogram:  02/23/24 Breast Density Cat C, BIRADS Cat 2 benign        OB History     Gravida  3   Para  2   Term  2   Preterm      AB  1   Living  2      SAB  1   IAB      Ectopic      Multiple      Live Births                 Patient Active Problem List   Diagnosis Date Noted   Uterovaginal prolapse, incomplete 10/28/2023   Genetic testing 07/19/2019   Family history of breast cancer    Family history of prostate cancer    Osteopenia 06/07/2019   Malignant neoplasm of upper-inner quadrant of right breast in female, estrogen receptor positive (HCC) 03/01/2019   DUB (dysfunctional uterine bleeding) 09/21/2013   Anxiety    Other and unspecified hyperlipidemia 04/05/2013    Past Medical History:  Diagnosis Date   Allergy    seasonal   Anxiety    Follows w/ Dr. Dwight @ [place].   Cancer [facility]) 02/2019   right breast IDC, Follows w/ [facility].   Family history of breast cancer    Family history of prostate cancer    Female bladder prolapse    Fibroid    Glaucoma    open angle   Hypertension     Dr. Dwight at [place].   Leg pain, bilateral    improved   Lung nodules    benign   Osteoporosis 2025   Other and unspecified hyperlipidemia 04/05/2013   borderline per pt   Peripheral vascular disease    chronic venous insufficiency bilaterally   Personal history of radiation therapy    05/24/2019 - 07/08/2019 to right breast   PONV (postoperative nausea and vomiting)    Varicose veins of both lower extremities    Wears glasses    for reading    Past Surgical History:  Procedure Laterality Date   BIOPSY THYROID   2019   FNA of thyroid    BLADDER SUSPENSION  2010   Dr.Silva   BREAST CYST ASPIRATION Right 09/08/2012   BREAST LUMPECTOMY Right 03/19/2019   BREAST LUMPECTOMY WITH RADIOACTIVE SEED AND SENTINEL LYMPH NODE BIOPSY Right 03/22/2019   Procedure: RIGHT BREAST LUMPECTOMY WITH RADIOACTIVE SEED AND RIGHT AXILLARY SENTINEL LYMPH NODE BIOPSY;  Surgeon: Ethyl Lenis, MD;  Location: [facility];  Service: General;  Laterality: Right;   COLONOSCOPY  2018   normal   CYSTOSCOPY N/A 10/28/2023   Procedure: CYSTOSCOPY;  Surgeon: Marilynne Rosaline SAILOR, MD;  Location: [facility];  Service: Gynecology;  Laterality: N/A;   INSERTION OF MESH N/A 11/01/2016   Procedure: INSERTION OF MESH;  Surgeon: Krystal Russell, MD;  Location: [facility] OR;  Service: General;  Laterality: N/A;   PERINEOPLASTY N/A 10/28/2023   Procedure: PERINEOPLASTY;  Surgeon: Marilynne Rosaline SAILOR, MD;  Location: [facility];  Service: Gynecology;  Laterality: N/A;   RHINOPLASTY     in high school   TONSILLECTOMY     VAGINAL PROLAPSE REPAIR  2010   cystocele and rectocele repair   varicose veins     VEIN LIGATION AND STRIPPING  1995   VENTRAL HERNIA REPAIR N/A 11/01/2016   Procedure: VENTRAL HERNIA REPAIR WITH MESH;  Surgeon: Krystal Russell, MD;  Location: [facility] OR;  Service: General;  Laterality: N/A;   XI ROBOTIC ASSISTED TOTAL HYSTERECTOMY WITH  SACROCOLPOPEXY N/A 10/28/2023   Procedure: XI ROBOTIC ASSISTED TOTAL HYSTERECTOMY WITH BILATERAL SALPINGO-OOPHORECTOMY AND  SACROCOLPOPEXY;  Surgeon: Marilynne Rosaline SAILOR, MD;  Location: [facility];  Service: Gynecology;  Laterality: N/A;    Current Outpatient Medications  Medication Sig Dispense Refill   amLODipine (NORVASC) 5 MG tablet Take 5 mg by mouth daily.     Ascorbic Acid (VITAMIN C) 1000 MG tablet Take 1,000 mg by mouth daily.     lactobacillus acidophilus (BACID) TABS tablet Take 1 tablet by mouth 3 (three) times a week. Reported on 12/12/2015     LORazepam (ATIVAN) 0.5 MG tablet Take 0.5 mg by mouth every 6 (six) hours as needed. PRN     Magnesium 300 MG CAPS Take 1 capsule by mouth in the morning and at bedtime.     multivitamin-lutein (OCUVITE-LUTEIN) CAPS Take 1 capsule by mouth daily.     NONFORMULARY OR COMPOUNDED ITEM Vaginal vitamin E suppository, 200 international units, place one per vagina twice weekly at bedtime.  #24, RF 3. 24 each 3   Omega-3 Fatty Acids (FISH OIL CONCENTRATE PO) Take by mouth daily at 12 noon.     OVER THE COUNTER MEDICATION Vitamin D  3 2000 iu taking in the a.m.     OVER THE COUNTER MEDICATION Flaxseed oil 1000 mg taking daily     Tafluprost (ZIOPTAN OP) Apply to eye.     tolnaftate (FORMULA 3 THE TREATMENT) 1 % external solution      No current facility-administered medications for this visit.     ALLERGIES: Fluconazole  Family History  Problem Relation Age of Onset   Cancer Mother    Tracheal cancer Mother    Varicose Veins Father    Heart disease Father    Prostate cancer Father    Stroke Brother    Alcohol abuse Brother    Liver disease Brother    Varicose Veins Brother    Heart disease Brother    Heart attack Brother    Mental illness Brother        d. 55   Schizophrenia Brother    Prostate cancer Brother        possible, unsure if BPA vs prostate cancer   Stroke Brother    Obesity  Brother    Cancer Maternal Grandmother        breast cancer   Breast cancer Maternal Grandmother        unsure of age   Heart disease Maternal Grandfather  Varicose Veins Paternal Grandmother    Healthy Son    Healthy Son     Social History   Socioeconomic History   Marital status: Married    Spouse name: Not on file   Number of children: Not on file   Years of education: Not on file   Highest education level: Not on file  Occupational History   Occupation: Registered Programmer, [employer]: UNEMPLOYED  Tobacco Use   Smoking status: Never    Passive exposure: Past   Smokeless tobacco: Never  Vaping Use   Vaping status: Never Used  Substance and Sexual Activity   Alcohol use: Not Currently   Drug use: Never   Sexual activity: Yes    Partners: Male    Birth control/protection: Post-menopausal, Surgical    Comment: hyst, Less than 5, after 14, no std, no abnormal pap, hx of breast cancer  Other Topics Concern   Not on file  Social History Narrative   Married. Education: college. Exercise: walk for 1 hour everyday.   Social Drivers of Health   Tobacco Use: Low Risk  (11/29/2024)   Received from [facility]   Patient History    Smoking Tobacco Use: Never    Smokeless Tobacco Use: Never    Passive Exposure: Not on file  Financial Resource Strain: Not on file  Food Insecurity: Not on file  Transportation Needs: Not on file  Physical Activity: Not on file  Stress: Not on file  Social Connections: Not on file  Intimate Partner Violence: Not on file  Depression (PHQ2-9): Low Risk (08/25/2024)   Depression (PHQ2-9)    PHQ-2 Score: 0  Alcohol Screen: Not on file  Housing: Unknown (11/29/2024)   Received from [facility]   Epic    Unable to Pay for Housing in the Last Year: Not on file    Number of Times Moved in the Last Year: Not on file    At any time in the past 12 months, were you homeless or  living in a shelter (including now)?: No  Utilities: Not on file  Health Literacy: Not on file    Review of Systems  All other systems reviewed and are negative.   PHYSICAL EXAMINATION:   LMP 04/05/2016     General appearance: alert, cooperative and appears stated age Head: Normocephalic, without obvious abnormality, atraumatic Neck: no adenopathy, supple, symmetrical, trachea midline and thyroid  normal to inspection and palpation Lungs: clear to auscultation bilaterally Breasts: normal appearance, no masses or tenderness, No nipple retraction or dimpling, No nipple discharge or bleeding, No axillary or supraclavicular adenopathy Heart: regular rate and rhythm Abdomen: soft, non-tender, no masses,  no organomegaly Extremities: extremities normal, atraumatic, no cyanosis or edema Skin: Skin color, texture, turgor normal. No rashes or lesions Lymph nodes: Cervical, supraclavicular, and axillary nodes normal. No abnormal inguinal nodes palpated Neurologic: Grossly normal  Pelvic: External genitalia:  no lesions              Urethra:  normal appearing urethra with no masses, tenderness or lesions              Bartholins and Skenes: normal                 Vagina: normal appearing vagina with normal color and discharge, no lesions              Cervix: no lesions  Bimanual Exam:  Uterus:  normal size, contour, position, consistency, mobility, non-tender              Adnexa: no mass, fullness, tenderness              Rectal exam: {yes no:314532}.  Confirms.              Anus:  normal sphincter tone, no lesions  Chaperone was present for exam:  {BSCHAPERONE:31226::Emily F, [name]}  ASSESSMENT:  Hx venous insufficiency. HTN.  PLAN:    {LABS (Optional):23779}  ***  total time was spent for this patient encounter, including preparation, face-to-face counseling with the patient, coordination of care, and documentation of the encounter.    "

## 2024-12-22 ENCOUNTER — Ambulatory Visit: Admitting: Rheumatology

## 2024-12-22 DIAGNOSIS — E559 Vitamin D deficiency, unspecified: Secondary | ICD-10-CM

## 2024-12-22 DIAGNOSIS — F419 Anxiety disorder, unspecified: Secondary | ICD-10-CM

## 2024-12-22 DIAGNOSIS — E782 Mixed hyperlipidemia: Secondary | ICD-10-CM

## 2024-12-22 DIAGNOSIS — R5383 Other fatigue: Secondary | ICD-10-CM

## 2024-12-22 DIAGNOSIS — Z5181 Encounter for therapeutic drug level monitoring: Secondary | ICD-10-CM

## 2024-12-22 DIAGNOSIS — C50211 Malignant neoplasm of upper-inner quadrant of right female breast: Secondary | ICD-10-CM

## 2024-12-22 DIAGNOSIS — M810 Age-related osteoporosis without current pathological fracture: Secondary | ICD-10-CM

## 2025-02-25 ENCOUNTER — Ambulatory Visit: Admitting: Rheumatology

## 2025-05-10 ENCOUNTER — Ambulatory Visit: Admitting: Hematology and Oncology

## 2025-08-29 ENCOUNTER — Ambulatory Visit: Admitting: Obstetrics and Gynecology
